# Patient Record
Sex: Male | Born: 1945
Health system: Southern US, Community
[De-identification: ages and names within clinical notes are randomized; demographics above are authoritative.]

## PROBLEM LIST (undated history)

## (undated) DIAGNOSIS — K219 Gastro-esophageal reflux disease without esophagitis: Secondary | ICD-10-CM

## (undated) DIAGNOSIS — M1990 Unspecified osteoarthritis, unspecified site: Secondary | ICD-10-CM

## (undated) DIAGNOSIS — E785 Hyperlipidemia, unspecified: Secondary | ICD-10-CM

## (undated) DIAGNOSIS — K4090 Unilateral inguinal hernia, without obstruction or gangrene, not specified as recurrent: Secondary | ICD-10-CM

## (undated) HISTORY — PX: TONSILLECTOMY AND ADENOIDECTOMY: SUR1326

## (undated) HISTORY — PX: JOINT REPLACEMENT: SHX530

## (undated) HISTORY — DX: Gastro-esophageal reflux disease without esophagitis: K21.9

## (undated) HISTORY — DX: Hyperlipidemia, unspecified: E78.5

---
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
## 2001-07-11 HISTORY — PX: KNEE SURGERY: SHX244

## 2004-12-09 ENCOUNTER — Ambulatory Visit: Payer: Self-pay | Admitting: Orthopaedic Surgery

## 2005-09-21 ENCOUNTER — Ambulatory Visit: Payer: Self-pay | Admitting: Orthopaedic Surgery

## 2006-07-28 ENCOUNTER — Ambulatory Visit: Payer: Self-pay | Admitting: Orthopaedic Surgery

## 2006-08-08 ENCOUNTER — Ambulatory Visit: Payer: Self-pay | Admitting: Gastroenterology

## 2006-08-16 ENCOUNTER — Ambulatory Visit: Payer: Self-pay | Admitting: Orthopaedic Surgery

## 2006-09-08 DIAGNOSIS — K5730 Diverticulosis of large intestine without perforation or abscess without bleeding: Secondary | ICD-10-CM | POA: Insufficient documentation

## 2007-06-25 DIAGNOSIS — M545 Low back pain, unspecified: Secondary | ICD-10-CM | POA: Insufficient documentation

## 2008-07-09 DIAGNOSIS — Z800 Family history of malignant neoplasm of digestive organs: Secondary | ICD-10-CM | POA: Insufficient documentation

## 2008-07-13 DIAGNOSIS — E782 Mixed hyperlipidemia: Secondary | ICD-10-CM | POA: Insufficient documentation

## 2010-08-02 ENCOUNTER — Ambulatory Visit: Payer: Self-pay | Admitting: Family Medicine

## 2011-11-04 DIAGNOSIS — L719 Rosacea, unspecified: Secondary | ICD-10-CM | POA: Diagnosis not present

## 2011-11-04 DIAGNOSIS — J069 Acute upper respiratory infection, unspecified: Secondary | ICD-10-CM | POA: Diagnosis not present

## 2011-11-04 DIAGNOSIS — IMO0002 Reserved for concepts with insufficient information to code with codable children: Secondary | ICD-10-CM | POA: Diagnosis not present

## 2011-11-04 DIAGNOSIS — Z0000 Encounter for general adult medical examination without abnormal findings: Secondary | ICD-10-CM | POA: Diagnosis not present

## 2012-01-16 DIAGNOSIS — Z131 Encounter for screening for diabetes mellitus: Secondary | ICD-10-CM | POA: Diagnosis not present

## 2012-01-16 DIAGNOSIS — Z136 Encounter for screening for cardiovascular disorders: Secondary | ICD-10-CM | POA: Diagnosis not present

## 2012-01-16 DIAGNOSIS — Z125 Encounter for screening for malignant neoplasm of prostate: Secondary | ICD-10-CM | POA: Diagnosis not present

## 2012-03-26 DIAGNOSIS — H2510 Age-related nuclear cataract, unspecified eye: Secondary | ICD-10-CM | POA: Diagnosis not present

## 2012-07-18 DIAGNOSIS — Z23 Encounter for immunization: Secondary | ICD-10-CM | POA: Diagnosis not present

## 2013-04-11 DIAGNOSIS — Z23 Encounter for immunization: Secondary | ICD-10-CM | POA: Diagnosis not present

## 2013-07-22 DIAGNOSIS — J069 Acute upper respiratory infection, unspecified: Secondary | ICD-10-CM | POA: Diagnosis not present

## 2013-07-22 DIAGNOSIS — Z1212 Encounter for screening for malignant neoplasm of rectum: Secondary | ICD-10-CM | POA: Diagnosis not present

## 2013-07-22 DIAGNOSIS — L719 Rosacea, unspecified: Secondary | ICD-10-CM | POA: Diagnosis not present

## 2013-07-22 DIAGNOSIS — IMO0002 Reserved for concepts with insufficient information to code with codable children: Secondary | ICD-10-CM | POA: Diagnosis not present

## 2013-07-22 DIAGNOSIS — Z0000 Encounter for general adult medical examination without abnormal findings: Secondary | ICD-10-CM | POA: Diagnosis not present

## 2013-08-06 DIAGNOSIS — R972 Elevated prostate specific antigen [PSA]: Secondary | ICD-10-CM | POA: Diagnosis not present

## 2013-08-06 DIAGNOSIS — E782 Mixed hyperlipidemia: Secondary | ICD-10-CM | POA: Diagnosis not present

## 2013-08-06 LAB — BASIC METABOLIC PANEL
BUN: 22 mg/dL — AB (ref 4–21)
CREATININE: 1 mg/dL (ref 0.6–1.3)
CREATININE: 0.8 mg/dL (ref <=1.3)
Glucose: 111 mg/dL
POTASSIUM: 4.7 mmol/L (ref 3.4–5.3)
Sodium: 138 mmol/L (ref 137–147)

## 2013-08-06 LAB — LIPID PANEL
CHOLESTEROL: 199 mg/dL (ref 0–200)
HDL: 58 mg/dL (ref 35–70)
LDL CALC: 125 mg/dL
TRIGLYCERIDES: 81 mg/dL (ref 40–160)

## 2013-08-06 LAB — PSA: PSA: 3.8

## 2013-12-24 DIAGNOSIS — K5730 Diverticulosis of large intestine without perforation or abscess without bleeding: Secondary | ICD-10-CM | POA: Diagnosis not present

## 2013-12-24 DIAGNOSIS — Z1211 Encounter for screening for malignant neoplasm of colon: Secondary | ICD-10-CM | POA: Diagnosis not present

## 2013-12-24 DIAGNOSIS — Z800 Family history of malignant neoplasm of digestive organs: Secondary | ICD-10-CM | POA: Diagnosis not present

## 2014-02-10 ENCOUNTER — Ambulatory Visit: Payer: Self-pay | Admitting: Gastroenterology

## 2014-02-10 DIAGNOSIS — E785 Hyperlipidemia, unspecified: Secondary | ICD-10-CM | POA: Diagnosis not present

## 2014-02-10 DIAGNOSIS — K649 Unspecified hemorrhoids: Secondary | ICD-10-CM | POA: Diagnosis not present

## 2014-02-10 DIAGNOSIS — Z800 Family history of malignant neoplasm of digestive organs: Secondary | ICD-10-CM | POA: Diagnosis not present

## 2014-02-10 DIAGNOSIS — Z1211 Encounter for screening for malignant neoplasm of colon: Secondary | ICD-10-CM | POA: Diagnosis not present

## 2014-02-10 DIAGNOSIS — K648 Other hemorrhoids: Secondary | ICD-10-CM | POA: Diagnosis not present

## 2014-02-10 DIAGNOSIS — D126 Benign neoplasm of colon, unspecified: Secondary | ICD-10-CM | POA: Diagnosis not present

## 2014-02-10 DIAGNOSIS — K219 Gastro-esophageal reflux disease without esophagitis: Secondary | ICD-10-CM | POA: Diagnosis not present

## 2014-02-10 DIAGNOSIS — K5730 Diverticulosis of large intestine without perforation or abscess without bleeding: Secondary | ICD-10-CM | POA: Diagnosis not present

## 2014-02-10 LAB — HM COLONOSCOPY

## 2014-02-11 LAB — PATHOLOGY REPORT

## 2014-03-19 DIAGNOSIS — Z23 Encounter for immunization: Secondary | ICD-10-CM | POA: Diagnosis not present

## 2014-04-08 DIAGNOSIS — H2510 Age-related nuclear cataract, unspecified eye: Secondary | ICD-10-CM | POA: Diagnosis not present

## 2014-04-08 DIAGNOSIS — H52229 Regular astigmatism, unspecified eye: Secondary | ICD-10-CM | POA: Diagnosis not present

## 2014-04-08 DIAGNOSIS — H524 Presbyopia: Secondary | ICD-10-CM | POA: Diagnosis not present

## 2014-10-28 ENCOUNTER — Encounter: Payer: Self-pay | Admitting: Family Medicine

## 2014-12-09 ENCOUNTER — Encounter: Payer: Self-pay | Admitting: Family Medicine

## 2014-12-09 DIAGNOSIS — R972 Elevated prostate specific antigen [PSA]: Secondary | ICD-10-CM | POA: Insufficient documentation

## 2014-12-09 DIAGNOSIS — Z131 Encounter for screening for diabetes mellitus: Secondary | ICD-10-CM | POA: Insufficient documentation

## 2014-12-09 DIAGNOSIS — E669 Obesity, unspecified: Secondary | ICD-10-CM | POA: Insufficient documentation

## 2014-12-09 DIAGNOSIS — L719 Rosacea, unspecified: Secondary | ICD-10-CM | POA: Insufficient documentation

## 2014-12-09 DIAGNOSIS — K219 Gastro-esophageal reflux disease without esophagitis: Secondary | ICD-10-CM | POA: Insufficient documentation

## 2014-12-09 DIAGNOSIS — M549 Dorsalgia, unspecified: Secondary | ICD-10-CM

## 2014-12-09 DIAGNOSIS — G8929 Other chronic pain: Secondary | ICD-10-CM | POA: Insufficient documentation

## 2014-12-23 ENCOUNTER — Encounter: Payer: Self-pay | Admitting: Family Medicine

## 2014-12-26 ENCOUNTER — Ambulatory Visit (INDEPENDENT_AMBULATORY_CARE_PROVIDER_SITE_OTHER): Payer: Medicare Other | Admitting: Family Medicine

## 2014-12-26 ENCOUNTER — Encounter: Payer: Self-pay | Admitting: Family Medicine

## 2014-12-26 VITALS — BP 120/80 | HR 72 | Temp 98.8°F | Resp 16 | Ht 67.0 in | Wt 236.0 lb

## 2014-12-26 DIAGNOSIS — E782 Mixed hyperlipidemia: Secondary | ICD-10-CM

## 2014-12-26 DIAGNOSIS — Z131 Encounter for screening for diabetes mellitus: Secondary | ICD-10-CM | POA: Diagnosis not present

## 2014-12-26 DIAGNOSIS — Z0000 Encounter for general adult medical examination without abnormal findings: Secondary | ICD-10-CM

## 2014-12-26 DIAGNOSIS — Z23 Encounter for immunization: Secondary | ICD-10-CM

## 2014-12-26 DIAGNOSIS — Z125 Encounter for screening for malignant neoplasm of prostate: Secondary | ICD-10-CM

## 2014-12-26 NOTE — Progress Notes (Signed)
Patient: Alexander Moss, Male    DOB: 1945/11/13, 69 y.o.   MRN: [id number] Visit Date: 12/28/2014  Today's Provider: Lelon Huh, MD   Chief Complaint  Patient presents with  . Medicare Wellness  . Gastrophageal Reflux  . Hyperlipidemia   Subjective:    Annual wellness visit Alexander Moss is a 69 y.o. male who presents today for his Subsequent Annual Wellness Visit. He feels well. He reports exercising biking. He reports he is sleeping fairly well.  -----------------------------------------------------------   Review of Systems  Constitutional: Negative for fatigue.  HENT: Negative for ear pain.   Eyes: Negative for pain.  Respiratory: Negative for chest tightness and shortness of breath.   Cardiovascular: Negative for chest pain, palpitations and leg swelling.  Musculoskeletal: Positive for back pain.       Joint pain  Neurological: Negative for dizziness, weakness, light-headedness and headaches.  Psychiatric/Behavioral: Negative for sleep disturbance.    History   Social History  . Marital Status: Married    Spouse Name: N/A  . Number of Children: 1  . Years of Education: [name]   Occupational History  . Retired    Social History Main Topics  . Smoking status: Former Research scientist (life sciences)  . Smokeless tobacco: Not on file  . Alcohol Use: 0.0 oz/week    0 Standard drinks or equivalent per week  . Drug Use: No  . Sexual Activity: Not on file   Other Topics Concern  . Not on file   Social History Narrative    Patient Active Problem List   Diagnosis Date Noted  . Chronic back pain 12/09/2014  . GERD (gastroesophageal reflux disease) 12/09/2014  . Obesity 12/09/2014  . PSA Elevation 12/09/2014  . Rosacea 12/09/2014  . Hyperlipidemia, mixed 07/13/2008  . Family history of GI tract cancer 07/09/2008  . Lumbago 06/25/2007  . Diverticulosis of colon without hemorrhage 09/08/2006    Past Surgical History  Procedure Laterality Date  . Knee surgery  Bilateral 2003  . Hemorrhoid surgery  1964  . Tonsillectomy and adenoidectomy      His family history includes Diabetes in his mother.    Previous Medications   ASPIRIN 81 MG TABLET    Take 1 tablet by mouth daily.   IBUPROFEN (ADVIL,MOTRIN) 200 MG TABLET    Take 1 tablet by mouth every 6 (six) hours as needed.   LORATADINE (CLARITIN) 10 MG TABLET    Take 1 tablet by mouth daily.   MULTIPLE VITAMINS-MINERALS (ZINC PO)    Take by mouth daily.   OMEGA-3 FATTY ACIDS (FISH OIL) 1000 MG CAPS    Take 2 capsules by mouth daily.   VITAMIN C (ASCORBIC ACID) 500 MG TABLET    Take 1 tablet by mouth daily.   VITAMIN D, CHOLECALCIFEROL, 1000 UNITS CAPS    Take 2 capsules by mouth daily.   VITAMIN E 400 UNIT CAPSULE    Take 400 Units by mouth daily.    Patient Care Team: Birdie Sons, MD as PCP - General (Family Medicine)     Objective:   Vitals: BP 120/80 mmHg  Pulse 72  Temp(Src) 98.8 F (37.1 C) (Oral)  Resp 16  Ht 5\' 7"  (1.702 m)  Wt 236 lb (107.049 kg)  BMI 36.95 kg/m2  SpO2 96%  Physical Exam   General Appearance:    Alert, cooperative, no distress, appears stated age  Head:    Normocephalic, without obvious abnormality, atraumatic  Eyes:  PERRL, conjunctiva/corneas clear, EOM's intact, fundi    benign, both eyes       Ears:    Normal TM's and external ear canals, both ears  Nose:   Nares normal, septum midline, mucosa normal, no drainage   or sinus tenderness  Throat:   Lips, mucosa, and tongue normal; teeth and gums normal  Neck:   Supple, symmetrical, trachea midline, no adenopathy;       thyroid:  No enlargement/tenderness/nodules; no carotid   bruit or JVD  Back:     Symmetric, no curvature, ROM normal, no CVA tenderness  Lungs:     Clear to auscultation bilaterally, respirations unlabored  Chest wall:    No tenderness or deformity  Heart:    Regular rate and rhythm, S1 and S2 normal, no murmur, rub   or gallop  Abdomen:     Soft, non-tender, bowel sounds active  all four quadrants,    no masses, no organomegaly  Genitalia:    deferred  Rectal:    deferred  Extremities:   Extremities normal, atraumatic, no cyanosis or edema  Pulses:   2+ and symmetric all extremities  Skin:   Skin color, texture, turgor normal, no rashes, multiple lipomas and cherry hemangiomas over front and back of trunk.   Lymph nodes:   Cervical, supraclavicular, and axillary nodes normal  Neurologic:   CNII-XII intact. Normal strength, sensation and reflexes      throughout    Activities of Daily Living No flowsheet data found.  Fall Risk Assessment Fall Risk  12/26/2014  Falls in the past year? Yes  Number falls in past yr: 2 or more  Injury with Fall? No  Risk for fall due to : History of fall(s)  Risk for fall due to (comments): Usually involves ladders     Depression Screen PHQ 2/9 Scores 12/26/2014  PHQ - 2 Score 0    Cognitive Testing - 6-CIT  Correct? Score   What year is it? yes 0 0 or 4  What month is it? yes 0 0 or 3  Memorize:    [name],  40,  [address],  [place],      What time is it? (within 1 hour) yes 0 0 or 3  Count backwards from 20 yes 0 0, 2, or 4  Name the months of the year yes 0 0, 2, or 4  Repeat name & address above yes 0 0, 2, 4, 6, 8, or 10       TOTAL SCORE  0/28   Interpretation:  Normal  Normal (0-7) Abnormal (8-28)       Assessment & Plan:     Annual Wellness Visit  Reviewed patient's Family Medical History Reviewed and updated list of patient's medical providers Assessment of cognitive impairment was done Assessed patient's functional ability Established a written schedule for health screening [place] Completed and Reviewed  Exercise Activities and Dietary recommendations Goals    None      Immunization History  Administered Date(s) Administered  . Influenza,inj,Quad PF,36+ Mos 04/10/2013  . Pneumococcal Polysaccharide-23 07/29/2009  . Td 07/11/2006  . Zoster 03/05/2009     Health Maintenance  Topic Date Due  . PNA vac Low Risk Adult (2 of 2 - PCV13) 07/29/2010  . INFLUENZA VACCINE  02/09/2015  . TETANUS/TDAP  07/11/2016  . COLONOSCOPY  02/11/2024  . ZOSTAVAX  Completed      Discussed health benefits of physical activity, and encouraged him to  engage in regular exercise appropriate for his age and condition.    ------------------------------------------------------------------------------------------------------------ 1. Annual physical exam   2. Prostate cancer screening  - PSA  3. Screening for diabetes mellitus  - Glucose  4. Hyperlipidemia, mixed  - Lipid panel  5. Need for prophylactic vaccination with Streptococcus pneumoniae (Pneumococcus) and Influenza vaccines  - Pneumococcal conjugate vaccine 13-valent IM  Lelon Huh, MD  12/26/2014

## 2014-12-31 DIAGNOSIS — E782 Mixed hyperlipidemia: Secondary | ICD-10-CM | POA: Diagnosis not present

## 2014-12-31 DIAGNOSIS — Z125 Encounter for screening for malignant neoplasm of prostate: Secondary | ICD-10-CM | POA: Diagnosis not present

## 2014-12-31 DIAGNOSIS — Z131 Encounter for screening for diabetes mellitus: Secondary | ICD-10-CM | POA: Diagnosis not present

## 2015-01-01 LAB — LIPID PANEL
Chol/HDL Ratio: 3.9 ratio units (ref 0.0–5.0)
Cholesterol, Total: 185 mg/dL (ref 100–199)
HDL: 47 mg/dL (ref >39)
LDL Calculated: 121 mg/dL — ABNORMAL HIGH (ref 0–99)
Triglycerides: 87 mg/dL (ref 0–149)
VLDL CHOLESTEROL CAL: 17 mg/dL (ref 5–40)

## 2015-01-01 LAB — PSA: PROSTATE SPECIFIC AG, SERUM: 4.8 ng/mL — AB (ref 0.0–4.0)

## 2015-01-01 LAB — GLUCOSE, RANDOM: GLUCOSE: 114 mg/dL — AB (ref 65–99)

## 2015-01-02 ENCOUNTER — Other Ambulatory Visit: Payer: Self-pay | Admitting: *Deleted

## 2015-01-02 DIAGNOSIS — R972 Elevated prostate specific antigen [PSA]: Secondary | ICD-10-CM

## 2015-01-05 ENCOUNTER — Telehealth: Payer: Self-pay | Admitting: Family Medicine

## 2015-01-05 DIAGNOSIS — H9193 Unspecified hearing loss, bilateral: Secondary | ICD-10-CM

## 2015-01-05 NOTE — Telephone Encounter (Signed)
Patient is requesting a referral to go see audiologist

## 2015-01-06 DIAGNOSIS — H9190 Unspecified hearing loss, unspecified ear: Secondary | ICD-10-CM | POA: Insufficient documentation

## 2015-01-06 NOTE — Telephone Encounter (Signed)
Please refer to audiology for evaluation of hearing loss. Thanks.

## 2015-01-13 ENCOUNTER — Encounter: Payer: Self-pay | Admitting: Family Medicine

## 2015-01-13 ENCOUNTER — Ambulatory Visit (INDEPENDENT_AMBULATORY_CARE_PROVIDER_SITE_OTHER): Payer: Medicare Other | Admitting: Family Medicine

## 2015-01-13 VITALS — BP 114/70 | HR 70 | Temp 97.9°F | Resp 16 | Ht 67.0 in | Wt 235.0 lb

## 2015-01-13 DIAGNOSIS — K219 Gastro-esophageal reflux disease without esophagitis: Secondary | ICD-10-CM

## 2015-01-13 DIAGNOSIS — R05 Cough: Secondary | ICD-10-CM | POA: Diagnosis not present

## 2015-01-13 DIAGNOSIS — R059 Cough, unspecified: Secondary | ICD-10-CM

## 2015-01-13 NOTE — Patient Instructions (Signed)
Discussed use of Prevacid 15 mg. Two pills in the evening for two to four weeks.. Also elevation of the legs of your bed to 6 inches. Minimize use of pillows for your head.

## 2015-01-13 NOTE — Progress Notes (Signed)
Subjective:     Patient ID: Alexander Moss, male   DOB: 16-Dec-1945, 69 y.o.   MRN: [id number]  HPI  Chief Complaint  Patient presents with  . Cough    Congestion x2 weeks  States cough bothers him more at night. Occasionally productive of beige sputum. No cold symptoms: "I don't feel bad." Reports taking TUMS daily; use two pillows for his heads at night. Prior hx of GERD per [facility].   Review of Systems  Constitutional: Negative for fever and chills.       Objective:   Physical Exam  Constitutional: He appears well-developed and well-nourished. No distress.  Ears: T.M's intact without inflammation Throat: no tonsillar enlargement or exudate Neck: no cervical adenopathy Lungs: clear     Assessment:    1. Cough-suspect mediated by nocturnal reflux 2. Gastroesophageal reflux disease, esophagitis presence not specified    Plan:    Discussed H.O.B. Elevation and the use of otc PPI's for 2-4 weeks. Phone f/u in a week if not improving.

## 2015-01-22 DIAGNOSIS — H903 Sensorineural hearing loss, bilateral: Secondary | ICD-10-CM | POA: Diagnosis not present

## 2015-01-22 DIAGNOSIS — H9319 Tinnitus, unspecified ear: Secondary | ICD-10-CM | POA: Diagnosis not present

## 2015-01-31 ENCOUNTER — Encounter: Payer: Self-pay | Admitting: Family Medicine

## 2015-02-01 NOTE — Telephone Encounter (Signed)
Please advise patient it is time to recheck PSA since it was elevated ij June.

## 2015-02-05 DIAGNOSIS — R972 Elevated prostate specific antigen [PSA]: Secondary | ICD-10-CM | POA: Diagnosis not present

## 2015-02-06 LAB — PSA: Prostate Specific Ag, Serum: 3.6 ng/mL (ref 0.0–4.0)

## 2015-02-06 NOTE — Telephone Encounter (Signed)
Done. Pt notified of results.

## 2015-04-14 DIAGNOSIS — Z23 Encounter for immunization: Secondary | ICD-10-CM | POA: Diagnosis not present

## 2015-07-12 HISTORY — PX: JOINT REPLACEMENT: SHX530

## 2015-12-31 DIAGNOSIS — M170 Bilateral primary osteoarthritis of knee: Secondary | ICD-10-CM | POA: Diagnosis not present

## 2016-04-12 DIAGNOSIS — Z23 Encounter for immunization: Secondary | ICD-10-CM | POA: Diagnosis not present

## 2016-04-17 ENCOUNTER — Ambulatory Visit: Payer: Self-pay | Admitting: Orthopedic Surgery

## 2016-04-26 ENCOUNTER — Telehealth: Payer: Self-pay | Admitting: Family Medicine

## 2016-04-26 NOTE — Telephone Encounter (Signed)
Called Pt to schedule AWV with [facility] for 11/2 before ov with Dr. Caryn Section - knb

## 2016-05-11 ENCOUNTER — Encounter (HOSPITAL_COMMUNITY): Payer: Self-pay

## 2016-05-11 DIAGNOSIS — H04123 Dry eye syndrome of bilateral lacrimal glands: Secondary | ICD-10-CM | POA: Diagnosis not present

## 2016-05-11 DIAGNOSIS — D3131 Benign neoplasm of right choroid: Secondary | ICD-10-CM | POA: Diagnosis not present

## 2016-05-11 DIAGNOSIS — H25013 Cortical age-related cataract, bilateral: Secondary | ICD-10-CM | POA: Diagnosis not present

## 2016-05-11 DIAGNOSIS — H35363 Drusen (degenerative) of macula, bilateral: Secondary | ICD-10-CM | POA: Diagnosis not present

## 2016-05-11 DIAGNOSIS — H2513 Age-related nuclear cataract, bilateral: Secondary | ICD-10-CM | POA: Diagnosis not present

## 2016-05-11 DIAGNOSIS — M1711 Unilateral primary osteoarthritis, right knee: Secondary | ICD-10-CM | POA: Diagnosis not present

## 2016-05-11 NOTE — Patient Instructions (Signed)
Alexander Moss  05/11/2016   Your procedure is scheduled on: 05/23/2016    Report to [facility] Main  Entrance take [facility]  elevators to 3rd floor to  [place] at   0600 AM.  Call this number if you have problems the morning of surgery [phone]   Remember: ONLY 1 PERSON MAY GO WITH YOU TO SHORT STAY TO GET  READY MORNING OF [place].  Do not eat food or drink liquids :After Midnight.     Take these medicines the morning of surgery with A SIP OF WATER: allegra                                 You may not have any metal on your body including hair pins and              piercings  Do not wear jewelry,  lotions, powders or perfumes, deodorant               Men may shave face and neck.   Do not bring valuables to the hospital. [place].  Contacts, dentures or bridgework may not be worn into surgery.  Leave suitcase in the car. After surgery it may be brought to your room.       Special Instructions: N/A              Please read over the following fact sheets you were given: _____________________________________________________________________             [facility] - Preparing for Surgery Before surgery, you can play an important role.  Because skin is not sterile, your skin needs to be as free of germs as possible.  You can reduce the number of germs on your skin by washing with CHG (chlorahexidine gluconate) soap before surgery.  CHG is an antiseptic cleaner which kills germs and bonds with the skin to continue killing germs even after washing. Please DO NOT use if you have an allergy to CHG or antibacterial soaps.  If your skin becomes reddened/irritated stop using the CHG and inform your nurse when you arrive at Short Stay. Do not shave (including legs and underarms) for at least 48 hours prior to the first CHG shower.  You may shave your face/neck. Please follow these instructions  carefully:  1.  Shower with CHG Soap the night before surgery and the  morning of Surgery.  2.  If you choose to wash your hair, wash your hair first as usual with your  normal  shampoo.  3.  After you shampoo, rinse your hair and body thoroughly to remove the  shampoo.                           4.  Use CHG as you would any other liquid soap.  You can apply chg directly  to the skin and wash                       Gently with a scrungie or clean washcloth.  5.  Apply the CHG Soap to your body ONLY FROM THE NECK DOWN.   Do not use on face/ open  Wound or open sores. Avoid contact with eyes, ears mouth and genitals (private parts).                       Wash face,  Genitals (private parts) with your normal soap.             6.  Wash thoroughly, paying special attention to the area where your surgery  will be performed.  7.  Thoroughly rinse your body with warm water from the neck down.  8.  DO NOT shower/wash with your normal soap after using and rinsing off  the CHG Soap.                9.  Pat yourself dry with a clean towel.            10.  Wear clean pajamas.            11.  Place clean sheets on your bed the night of your first shower and do not  sleep with pets. Day of Surgery : Do not apply any lotions/deodorants the morning of surgery.  Please wear clean clothes to the hospital/surgery center.  FAILURE TO FOLLOW THESE INSTRUCTIONS MAY RESULT IN THE CANCELLATION OF YOUR SURGERY PATIENT SIGNATURE_________________________________  NURSE SIGNATURE__________________________________  ________________________________________________________________________  WHAT IS A BLOOD TRANSFUSION? Blood Transfusion Information  A transfusion is the replacement of blood or some of its parts. Blood is made up of multiple cells which provide different functions.  Red blood cells carry oxygen and are used for blood loss replacement.  White blood cells fight against  infection.  Platelets control bleeding.  Plasma helps clot blood.  Other blood products are available for specialized needs, such as hemophilia or other clotting disorders. BEFORE THE TRANSFUSION  Who gives blood for transfusions?   Healthy volunteers who are fully evaluated to make sure their blood is safe. This is blood bank blood. Transfusion therapy is the safest it has ever been in the practice of medicine. Before blood is taken from a donor, a complete history is taken to make sure that person has no history of diseases nor engages in risky social behavior (examples are intravenous drug use or sexual activity with multiple partners). The donor's travel history is screened to minimize risk of transmitting infections, such as malaria. The donated blood is tested for signs of infectious diseases, such as HIV and hepatitis. The blood is then tested to be sure it is compatible with you in order to minimize the chance of a transfusion reaction. If you or a relative donates blood, this is often done in anticipation of surgery and is not appropriate for emergency situations. It takes many days to process the donated blood. RISKS AND COMPLICATIONS Although transfusion therapy is very safe and saves many lives, the main dangers of transfusion include:   Getting an infectious disease.  Developing a transfusion reaction. This is an allergic reaction to something in the blood you were given. Every precaution is taken to prevent this. The decision to have a blood transfusion has been considered carefully by your caregiver before blood is given. Blood is not given unless the benefits outweigh the risks. AFTER THE TRANSFUSION  Right after receiving a blood transfusion, you will usually feel much better and more energetic. This is especially true if your red blood cells have gotten low (anemic). The transfusion raises the level of the red blood cells which carry oxygen, and this usually causes an energy  increase.  The nurse administering the transfusion will monitor you carefully for complications. HOME CARE INSTRUCTIONS  No special instructions are needed after a transfusion. You may find your energy is better. Speak with your caregiver about any limitations on activity for underlying diseases you may have. SEEK MEDICAL CARE IF:   Your condition is not improving after your transfusion.  You develop redness or irritation at the intravenous (IV) site. SEEK IMMEDIATE MEDICAL CARE IF:  Any of the following symptoms occur over the next 12 hours:  Shaking chills.  You have a temperature by mouth above 102 F (38.9 C), not controlled by medicine.  Chest, back, or muscle pain.  People around you feel you are not acting correctly or are confused.  Shortness of breath or difficulty breathing.  Dizziness and fainting.  You get a rash or develop hives.  You have a decrease in urine output.  Your urine turns a dark color or changes to pink, red, or brown. Any of the following symptoms occur over the next 10 days:  You have a temperature by mouth above 102 F (38.9 C), not controlled by medicine.  Shortness of breath.  Weakness after normal activity.  The white part of the eye turns yellow (jaundice).  You have a decrease in the amount of urine or are urinating less often.  Your urine turns a dark color or changes to pink, red, or brown. Document Released: 06/24/2000 Document Revised: 09/19/2011 Document Reviewed: 02/11/2008 ExitCare Patient Information 2014 [place].  _______________________________________________________________________  Incentive Spirometer  An incentive spirometer is a tool that can help keep your lungs clear and active. This tool measures how well you are filling your lungs with each breath. Taking long deep breaths may help reverse or decrease the chance of developing breathing (pulmonary) problems (especially infection) following:  A long  period of time when you are unable to move or be active. BEFORE THE PROCEDURE   If the spirometer includes an indicator to show your best effort, your nurse or respiratory therapist will set it to a desired goal.  If possible, sit up straight or lean slightly forward. Try not to slouch.  Hold the incentive spirometer in an upright position. INSTRUCTIONS FOR USE  1. Sit on the edge of your bed if possible, or sit up as far as you can in bed or on a chair. 2. Hold the incentive spirometer in an upright position. 3. Breathe out normally. 4. Place the mouthpiece in your mouth and seal your lips tightly around it. 5. Breathe in slowly and as deeply as possible, raising the piston or the ball toward the top of the column. 6. Hold your breath for 3-5 seconds or for as long as possible. Allow the piston or ball to fall to the bottom of the column. 7. Remove the mouthpiece from your mouth and breathe out normally. 8. Rest for a few seconds and repeat Steps 1 through 7 at least 10 times every 1-2 hours when you are awake. Take your time and take a few normal breaths between deep breaths. 9. The spirometer may include an indicator to show your best effort. Use the indicator as a goal to work toward during each repetition. 10. After each set of 10 deep breaths, practice coughing to be sure your lungs are clear. If you have an incision (the cut made at the time of surgery), support your incision when coughing by placing a pillow or rolled up towels firmly against it. Once you are able to get  out of bed, walk around indoors and cough well. You may stop using the incentive spirometer when instructed by your caregiver.  RISKS AND COMPLICATIONS  Take your time so you do not get dizzy or light-headed.  If you are in pain, you may need to take or ask for pain medication before doing incentive spirometry. It is harder to take a deep breath if you are having pain. AFTER USE  Rest and breathe slowly and  easily.  It can be helpful to keep track of a log of your progress. Your caregiver can provide you with a simple table to help with this. If you are using the spirometer at home, follow these instructions: [place] IF:   You are having difficultly using the spirometer.  You have trouble using the spirometer as often as instructed.  Your pain medication is not giving enough relief while using the spirometer.  You develop fever of 100.5 F (38.1 C) or higher. SEEK IMMEDIATE MEDICAL CARE IF:   You cough up bloody sputum that had not been present before.  You develop fever of 102 F (38.9 C) or greater.  You develop worsening pain at or near the incision site. MAKE SURE YOU:   Understand these instructions.  Will watch your condition.  Will get help right away if you are not doing well or get worse. Document Released: 11/07/2006 Document Revised: 09/19/2011 Document Reviewed: 01/08/2007 [facility] Patient Information 2014 [place], [place].   ________________________________________________________________________

## 2016-05-12 ENCOUNTER — Ambulatory Visit (INDEPENDENT_AMBULATORY_CARE_PROVIDER_SITE_OTHER): Payer: Medicare Other | Admitting: Family Medicine

## 2016-05-12 ENCOUNTER — Encounter: Payer: Self-pay | Admitting: Family Medicine

## 2016-05-12 VITALS — BP 124/80 | HR 66 | Temp 99.0°F | Ht 66.0 in | Wt 234.4 lb

## 2016-05-12 VITALS — BP 124/80 | HR 66 | Temp 99.0°F | Resp 16 | Ht 67.0 in | Wt 237.0 lb

## 2016-05-12 DIAGNOSIS — Z0000 Encounter for general adult medical examination without abnormal findings: Secondary | ICD-10-CM

## 2016-05-12 DIAGNOSIS — Z01810 Encounter for preprocedural cardiovascular examination: Secondary | ICD-10-CM

## 2016-05-12 NOTE — Progress Notes (Signed)
Subjective:   Alexander Moss is a 70 y.o. male who presents for Medicare Annual/Subsequent preventive examination.  Review of Systems:   Cardiac Risk Factors include: advanced age (>52men, >70 women);dyslipidemia;male gender;obesity (BMI >30kg/m2)     Objective:    Vitals: BP 124/80   Pulse 66   Temp 99 F (37.2 C) (Oral)   Ht 5\' 6"  (1.676 m)   Wt 234 lb 6 oz (106.3 kg)   BMI 37.83 kg/m   Body mass index is 37.83 kg/m.  Tobacco History  Smoking Status  . Former Smoker  Smokeless Tobacco  . Never Used     Counseling given: No   History reviewed. No pertinent past medical history. Past Surgical History:  Procedure Laterality Date  . [place]  . KNEE SURGERY Bilateral 2003  . TONSILLECTOMY AND ADENOIDECTOMY     Family History  Problem Relation Age of Onset  . Diabetes Mother    History  Sexual Activity  . Sexual activity: Not on file    Outpatient Encounter Prescriptions as of 05/12/2016  Medication Sig  . aspirin 81 MG tablet Take 1 tablet by mouth daily.  . Calcium Carb-Cholecalciferol (CALCIUM 1000 + D PO) Take 1 tablet by mouth daily.  . Cyanocobalamin (VITAMIN B-12 PO) Take 1,000 Units by mouth daily.   . fexofenadine (ALLEGRA) 180 MG tablet Take 180 mg by mouth daily.  . Multiple Vitamins-Minerals (ZINC PO) Take by mouth daily.  . naproxen sodium (ANAPROX) 220 MG tablet Take 440 mg by mouth daily. Will stop prior to procedure  . Omega-3 Fatty Acids (FISH OIL) 1000 MG CAPS Take 2 capsules by mouth daily.  . vitamin C (ASCORBIC ACID) 500 MG tablet Take 1,000 mg by mouth daily.  . Vitamin D, Cholecalciferol, 1000 UNITS CAPS Take 2 capsules by mouth daily.  . vitamin E 400 UNIT capsule Take 400 Units by mouth daily.  . Zinc 50 MG CAPS Take 1 capsule by mouth daily.   No facility-administered encounter medications on file as of 05/12/2016.     Activities of Daily Living In your present state of health, do you have any difficulty  performing the following activities: 05/12/2016  Hearing? Y  Vision? N  Difficulty concentrating or making decisions? Y  Walking or climbing stairs? Y  Dressing or bathing? N  Doing errands, shopping? N  Preparing Food and eating ? N  Using the Toilet? N  In the past six months, have you accidently leaked urine? N  Do you have problems with loss of bowel control? N  Managing your Medications? N  Managing your Finances? Y  Housekeeping or managing your Housekeeping? 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 Increase water intake          Starting 05/12/16, I will increase my water in take back to 6 glasses a day.      Fall Risk Fall Risk  05/12/2016 05/12/2016 12/26/2014  Falls in the past year? No No Yes  Number falls in past yr: - - 2 or more  Injury with Fall? - - No  Risk for fall due to : - - History of fall(s)  Risk for fall due to (comments): - - Usually involves ladders   Depression Screen PHQ 2/9 Scores 05/12/2016 05/12/2016 12/26/2014  PHQ - 2 Score 0 0 0    Cognitive Function     6CIT Screen 05/12/2016  What Year? 0 points  What month? 0 points  What time? 0 points  Count back from 20 0 points  Months in reverse 0 points  Repeat phrase 0 points  Total Score 0    Immunization History  Administered Date(s) Administered  . Influenza,inj,Quad PF,36+ Mos 04/10/2013  . Pneumococcal Conjugate-13 12/26/2014  . Pneumococcal Polysaccharide-23 07/29/2009  . Td 07/11/2006  .  Zoster 03/05/2009   Screening Tests Health Maintenance  Topic Date Due  . Hepatitis C Screening  07/10/2016 (Originally Oct 10, 1945)  . TETANUS/TDAP  07/11/2016  . COLONOSCOPY  02/11/2024  . INFLUENZA VACCINE  Completed  . ZOSTAVAX  Completed  . PNA vac Low Risk Adult  Completed      Plan:  I have personally reviewed and addressed the Medicare Annual Wellness questionnaire and have noted the following in the patient's chart:  A. Medical and social history B. Use of alcohol, tobacco or illicit drugs  C. Current medications and supplements D. Functional ability and status E.  Nutritional status F.  Physical activity G. Advance directives H. List of other physicians I.  Hospitalizations, surgeries, and ER visits in previous 12 months J.  [place] such as hearing and vision if needed, cognitive and depression L. Referrals and appointments - none  In addition, I have reviewed and discussed with patient certain preventive protocols, quality metrics, and best practice recommendations. A written personalized care plan for preventive services as well as general preventive health recommendations were provided to patient.  See attached scanned questionnaire for additional information.   Signed,  Fabio Neighbors, LPN Nurse Health Advisor   I have reviewed the health advisor's note, was available for consultation, and agree with documentation and plan  Lelon Huh, MD

## 2016-05-12 NOTE — Progress Notes (Signed)
Patient: Alexander Moss Male    DOB: 03/02/1946   70 y.o.   MRN: [id number] Visit Date: 05/12/2016  Today's Provider: Lelon Huh, MD   Chief Complaint  Patient presents with  . Medical Clearance    Surgical clearance   Subjective:    HPI Surgical Clearance: Patient is scheduled to have right total knee arthoplasty on 05/23/2016 at [facility]. Surgeon will be Dr. Gaynelle Arabian. Patient denies any recent fever chills or sweats, chest pains, dizziness, dyspnea or palpitations. Has no personal or family history of surgical or anesthesia complications. Denies lout snoring or any reports abnormal breathing when sleeping. Sleeps well and wakes refreshed with no daytime somnolence.   Past Surgical History:  Procedure Laterality Date  . [place]  . KNEE SURGERY Bilateral 2003  . TONSILLECTOMY AND ADENOIDECTOMY     No past medical history on file.  Patient Active Problem List   Diagnosis Date Noted  . Cough 01/13/2015  . Hearing loss 01/06/2015  . Chronic back pain 12/09/2014  . GERD (gastroesophageal reflux disease) 12/09/2014  . Obesity 12/09/2014  . PSA Elevation 12/09/2014  . Rosacea 12/09/2014  . Hyperlipidemia, mixed 07/13/2008  . Family history of GI tract cancer 07/09/2008  . Lumbago 06/25/2007  . Diverticulosis of colon without hemorrhage 09/08/2006   family history includes Diabetes in his mother.   Family Status  Relation Status  . Mother Deceased at age 20   Heart Failure  . Father Deceased at age 69   unknown  . Sister Deceased   colon cancer  . Daughter Alive      No Known Allergies   Current Outpatient Prescriptions:  .  aspirin 81 MG tablet, Take 1 tablet by mouth daily., Disp: , Rfl:  .  Calcium Carb-Cholecalciferol (CALCIUM 1000 + D PO), Take 1 tablet by mouth daily., Disp: , Rfl:  .  Cyanocobalamin (VITAMIN B-12 PO), Take 1,000 Units by mouth daily. , Disp: , Rfl:  .  fexofenadine (ALLEGRA) 180 MG tablet, Take  180 mg by mouth daily., Disp: , Rfl:  .  Multiple Vitamins-Minerals (ZINC PO), Take by mouth daily., Disp: , Rfl:  .  naproxen sodium (ANAPROX) 220 MG tablet, Take 440 mg by mouth daily. Will stop prior to procedure, Disp: , Rfl:  .  Omega-3 Fatty Acids (FISH OIL) 1000 MG CAPS, Take 2 capsules by mouth daily., Disp: , Rfl:  .  vitamin C (ASCORBIC ACID) 500 MG tablet, Take 1,000 mg by mouth daily., Disp: , Rfl:  .  Vitamin D, Cholecalciferol, 1000 UNITS CAPS, Take 2 capsules by mouth daily., Disp: , Rfl:  .  vitamin E 400 UNIT capsule, Take 400 Units by mouth daily., Disp: , Rfl:  .  Zinc 50 MG CAPS, Take 1 capsule by mouth daily., Disp: , Rfl:   Review of Systems  Constitutional: Negative for appetite change, chills and fever.  Respiratory: Negative for chest tightness, shortness of breath and wheezing.   Cardiovascular: Negative for chest pain and palpitations.  Gastrointestinal: Negative for abdominal pain, nausea and vomiting.  Musculoskeletal: Positive for arthralgias (right knee).    Social History  Substance Use Topics  . Smoking status: Former Research scientist (life sciences)  . Smokeless tobacco: Never Used  . Alcohol use 0.0 oz/week     Comment: 1/2 beer daily or less   Objective:   BP 124/80 (BP Location: Right Arm, Patient Position: Sitting, Cuff Size: Large)   Pulse 66   Temp  24 F (37.2 C) (Oral)   Resp 16   Ht 5\' 7"  (1.702 m)   Wt 237 lb (107.5 kg)   SpO2 95% Comment: room air  BMI 37.12 kg/m   Physical Exam   General Appearance:    Alert, cooperative, no distress, appears stated age, obese  Head:    Normocephalic, without obvious abnormality, atraumatic  Eyes:    PERRL, conjunctiva/corneas clear, EOM's intact, fundi    benign, both eyes       Ears:    Normal TM's and external ear canals, both ears  Nose:   Nares normal, septum midline, mucosa normal, no drainage   or sinus tenderness  Throat:   Lips, mucosa, and tongue normal; teeth and gums normal  Neck:   Supple, symmetrical,  trachea midline, no adenopathy;       thyroid:  No enlargement/tenderness/nodules; no carotid   bruit or JVD  Back:     Symmetric, no curvature, ROM normal, no CVA tenderness  Lungs:     Clear to auscultation bilaterally, respirations unlabored  Chest wall:    No tenderness or deformity  Heart:    Regular rate and rhythm, S1 and S2 normal, no murmur, rub   or gallop  Abdomen:     Soft, non-tender, bowel sounds active all four quadrants,    no masses, no organomegaly  Genitalia:    deferred  Rectal:    deferred  Extremities:   Extremities normal, atraumatic, no cyanosis or edema  Pulses:   2+ and symmetric all extremities  Skin:   Skin color, texture, turgor normal, no rashes or lesions. Innumerable lipmos  Lymph nodes:   Cervical, supraclavicular, and axillary nodes normal  Neurologic:   CNII-XII intact. Normal strength, sensation and reflexes      throughout   EKG: First degree AV block Occasional ectopic beats.     Assessment & Plan:     1. Preoperative cardiovascular examination Patient has no absolute or relative contraindications to planned surgery and anesthesia including general anesthesia. He is at low risk for cardiac and pulmonary complications. Anticipates pre-op labs being done at upcoming appointment. If labs normal then no additional medical precautions recommended. He reports he has already had flu vaccine this year.  - EKG 12-Lead       Lelon Huh, MD  [facility]

## 2016-05-12 NOTE — Patient Instructions (Signed)
Alexander Moss , Thank you for taking time to come for your Medicare Wellness Visit. I appreciate your ongoing commitment to your health goals. Please review the following plan we discussed and let me know if I can assist you in the future.   These are the goals we discussed: Goals    . Increase water intake          Starting 05/12/16, I will increase my water in take back to 6 glasses a day.       This is a list of the screening recommended for you and due dates:  Health Maintenance  Topic Date Due  .  Hepatitis C: One time screening is recommended by Center for Disease Control  (CDC) for  adults born from [birth year] through 1967.   07/10/2016*  . Tetanus Vaccine  07/11/2016  . Colon Cancer Screening  02/11/2024  . Flu Shot  Completed  . Shingles Vaccine  Completed  . Pneumonia vaccines  Completed  *Topic was postponed. The date shown is not the original due date.   Preventive Care for Adults  A healthy lifestyle and preventive care can promote health and wellness. Preventive health guidelines for adults include the following key practices.  . A routine yearly physical is a good way to check with your health care provider about your health and preventive screening. It is a chance to share any concerns and updates on your health and to receive a thorough exam.  . Visit your dentist for a routine exam and preventive care every 6 months. Brush your teeth twice a day and floss once a day. Good oral hygiene prevents tooth decay and gum disease.  . The frequency of eye exams is based on your age, health, family medical history, use  of contact lenses, and other factors. Follow your health care provider's ecommendations for frequency of eye exams.  . Eat a healthy diet. Foods like vegetables, fruits, whole grains, low-fat dairy products, and lean protein foods contain the nutrients you need without too many calories. Decrease your intake of foods high in solid fats, added sugars, and salt. Eat the  right amount of calories for you. Get information about a proper diet from your health care provider, if necessary.  . Regular physical exercise is one of the most important things you can do for your health. Most adults should get at least 150 minutes of moderate-intensity exercise (any activity that increases your heart rate and causes you to sweat) each week. In addition, most adults need muscle-strengthening exercises on 2 or more days a week.  Silver Sneakers may be a benefit available to you. To determine eligibility, you may visit the website: www.silversneakers.com or contact program at 229-704-8101 Mon-Fri between 8AM-8PM.   . Maintain a healthy weight. The body mass index (BMI) is a screening tool to identify possible weight problems. It provides an estimate of body fat based on height and weight. Your health care provider can find your BMI and can help you achieve or maintain a healthy weight.   For adults 20 years and older: ? A BMI below 18.5 is considered underweight. ? A BMI of 18.5 to 24.9 is normal. ? A BMI of 25 to 29.9 is considered overweight. ? A BMI of 30 and above is considered obese.   . Maintain normal blood lipids and cholesterol levels by exercising and minimizing your intake of saturated fat. Eat a balanced diet with plenty of fruit and vegetables. Blood tests for lipids and cholesterol should  begin at age 92 and be repeated every 5 years. If your lipid or cholesterol levels are high, you are over 50, or you are at high risk for heart disease, you may need your cholesterol levels checked more frequently. Ongoing high lipid and cholesterol levels should be treated with medicines if diet and exercise are not working.  . If you smoke, find out from your health care provider how to quit. If you do not use tobacco, please do not start.  . If you choose to drink alcohol, please do not consume more than 2 drinks per day. One drink is considered to be 12 ounces (355 mL) of  beer, 5 ounces (148 mL) of wine, or 1.5 ounces (44 mL) of liquor.  . If you are 20-75 years old, ask your health care provider if you should take aspirin to prevent strokes.  . Use sunscreen. Apply sunscreen liberally and repeatedly throughout the day. You should seek shade when your shadow is shorter than you. Protect yourself by wearing long sleeves, pants, a wide-brimmed hat, and sunglasses year round, whenever you are outdoors.  . Once a month, do a whole body skin exam, using a mirror to look at the skin on your back. Tell your health care provider of new moles, moles that have irregular borders, moles that are larger than a pencil eraser, or moles that have changed in shape or color.

## 2016-05-13 ENCOUNTER — Encounter: Payer: Self-pay | Admitting: Family Medicine

## 2016-05-13 ENCOUNTER — Encounter (HOSPITAL_COMMUNITY)
Admission: RE | Admit: 2016-05-13 | Discharge: 2016-05-13 | Disposition: A | Discharge status: Home / Self Care | Payer: Medicare Other | Source: Ambulatory Visit | Attending: Orthopedic Surgery | Admitting: Orthopedic Surgery

## 2016-05-13 ENCOUNTER — Encounter (HOSPITAL_COMMUNITY): Payer: Self-pay

## 2016-05-13 DIAGNOSIS — R05 Cough: Secondary | ICD-10-CM | POA: Diagnosis not present

## 2016-05-13 DIAGNOSIS — E669 Obesity, unspecified: Secondary | ICD-10-CM | POA: Diagnosis not present

## 2016-05-13 DIAGNOSIS — H9190 Unspecified hearing loss, unspecified ear: Secondary | ICD-10-CM | POA: Insufficient documentation

## 2016-05-13 DIAGNOSIS — M545 Low back pain: Secondary | ICD-10-CM | POA: Insufficient documentation

## 2016-05-13 DIAGNOSIS — K219 Gastro-esophageal reflux disease without esophagitis: Secondary | ICD-10-CM | POA: Diagnosis not present

## 2016-05-13 DIAGNOSIS — L719 Rosacea, unspecified: Secondary | ICD-10-CM | POA: Insufficient documentation

## 2016-05-13 DIAGNOSIS — E782 Mixed hyperlipidemia: Secondary | ICD-10-CM | POA: Insufficient documentation

## 2016-05-13 DIAGNOSIS — R972 Elevated prostate specific antigen [PSA]: Secondary | ICD-10-CM | POA: Diagnosis not present

## 2016-05-13 DIAGNOSIS — Z01812 Encounter for preprocedural laboratory examination: Secondary | ICD-10-CM | POA: Insufficient documentation

## 2016-05-13 DIAGNOSIS — Z800 Family history of malignant neoplasm of digestive organs: Secondary | ICD-10-CM | POA: Insufficient documentation

## 2016-05-13 DIAGNOSIS — K5730 Diverticulosis of large intestine without perforation or abscess without bleeding: Secondary | ICD-10-CM | POA: Diagnosis not present

## 2016-05-13 DIAGNOSIS — M1990 Unspecified osteoarthritis, unspecified site: Secondary | ICD-10-CM | POA: Insufficient documentation

## 2016-05-13 HISTORY — DX: Unspecified osteoarthritis, unspecified site: M19.90

## 2016-05-13 LAB — COMPREHENSIVE METABOLIC PANEL
ALK PHOS: 56 U/L (ref 38–126)
ALT: 31 U/L (ref 17–63)
AST: 41 U/L (ref 15–41)
Albumin: 4.1 g/dL (ref 3.5–5.0)
Anion gap: 6 (ref 5–15)
BILIRUBIN TOTAL: 0.9 mg/dL (ref 0.3–1.2)
BUN: 24 mg/dL — AB (ref 6–20)
CALCIUM: 9.5 mg/dL (ref 8.9–10.3)
CHLORIDE: 103 mmol/L (ref 101–111)
CO2: 27 mmol/L (ref 22–32)
CREATININE: 0.95 mg/dL (ref 0.61–1.24)
Glucose, Bld: 90 mg/dL (ref 65–99)
Potassium: 5 mmol/L (ref 3.5–5.1)
Sodium: 136 mmol/L (ref 135–145)
TOTAL PROTEIN: 7.9 g/dL (ref 6.5–8.1)

## 2016-05-13 LAB — PROTIME-INR
INR: 1
PROTHROMBIN TIME: 13.2 s (ref 11.4–15.2)

## 2016-05-13 LAB — URINALYSIS, ROUTINE W REFLEX MICROSCOPIC
Bilirubin Urine: NEGATIVE
GLUCOSE, UA: NEGATIVE mg/dL
Hgb urine dipstick: NEGATIVE
KETONES UR: NEGATIVE mg/dL
LEUKOCYTES UA: NEGATIVE
NITRITE: NEGATIVE
PH: 7.5 (ref 5.0–8.0)
Protein, ur: NEGATIVE mg/dL
SPECIFIC GRAVITY, URINE: 1.018 (ref 1.005–1.030)

## 2016-05-13 LAB — CBC
HEMATOCRIT: 45.4 % (ref 39.0–52.0)
Hemoglobin: 15.7 g/dL (ref 13.0–17.0)
MCH: 32.9 pg (ref 26.0–34.0)
MCHC: 34.6 g/dL (ref 30.0–36.0)
MCV: 95.2 fL (ref 78.0–100.0)
PLATELETS: 266 10*3/uL (ref 150–400)
RBC: 4.77 MIL/uL (ref 4.22–5.81)
RDW: 13.1 % (ref 11.5–15.5)
WBC: 6.6 10*3/uL (ref 4.0–10.5)

## 2016-05-13 LAB — APTT: aPTT: 32 seconds (ref 24–36)

## 2016-05-13 LAB — SURGICAL PCR SCREEN
MRSA, PCR: NEGATIVE
STAPHYLOCOCCUS AUREUS: POSITIVE — AB

## 2016-05-13 LAB — ABO/RH: ABO/RH(D): O POS

## 2016-05-17 NOTE — Progress Notes (Signed)
Clearance- Dr Caryn Section 05/12/16- on chart

## 2016-05-22 ENCOUNTER — Ambulatory Visit: Payer: Self-pay | Admitting: Orthopedic Surgery

## 2016-05-22 NOTE — H&P (Signed)
Alexander Moss DOB: 1946/03/17 Married / Language: [name] / Race: White Male Date of Admission:  05/23/2016 CC:  Right knee pain History of Present Illness The patient is a 70 year old male who comes in for a preoperative History and Physical. The patient is scheduled for a right total knee arthroplasty to be performed by Dr. Dione Plover. Aluisio, MD at [facility] on 05-23-2016. The patient was seen as a second opinion. The patient reports right knee (greater than left) symptoms including: pain which began year(s) ago without any known injury (states due to his job he was kneeling a lot).The patient feels that the symptoms are worsening (especially over the past 6 months). Previous work-up for this problem has included none (recently, history of left knee arthroscopy in 2007 and right knee in 2008). Symptoms are reported to be located in the left knee and right knee and include knee pain (sharp off and on), stiffness (after sitting), instability (occasionally) and difficulty ambulating (after walking for longer periods), while symptoms reported do not include swelling. Current treatment includes nonsteroidal anti-inflammatory drugs (Aleve). His right knee has gotten progressively worse over time. He is at a stage now where it is bothering him with most activities. He would like to be more active, but the right knee is preventing him from doing so. Left knee bothers him, but to a lesser degree. He has had injection in the past without much benefit. He is looking for more permanent solution to his knee problem at this time. He is ready to proceed with surgery. They have been treated conservatively in the past for the above stated problem and despite conservative measures, they continue to have progressive pain and severe functional limitations and dysfunction. They have failed non-operative management including home exercise, medications, and injections. It is felt that they would benefit from  undergoing total joint replacement. Risks and benefits of the procedure have been discussed with the patient and they elect to proceed with surgery. There are no active contraindications to surgery such as ongoing infection or rapidly progressive neurological disease.  Problem List/Past Medical  Primary osteoarthritis of both knees (M17.0)  Gastroesophageal Reflux Disease  Impaired Hearing  Measles  Mumps  Allergies No Known Drug Allergies  Family History Cancer  Father, Sister. Diabetes Mellitus  Mother. First Degree Relatives  reported Heart Disease  Father. Heart disease in male family member before age 44  Hypertension  Mother.  Social History Children  1 Current drinker  12/31/2015: Currently drinks beer and hard liquor less than 5 times per week Current work status  retired Furniture conservator/restorer weekly; does gym / [employer] Living situation  live with spouse Marital status  married No history of drug/alcohol rehab  Not under pain contract  Number of flights of stairs before winded  2-3 Tobacco / smoke exposure  12/31/2015: no Tobacco use  Former smoker. 12/31/2015: smoke(d) 1 1/2 pack(s) per day Advance Directives  Living Will, [place] with wife following the Right Total Knee Arthroplasty to be performed on 05/23/2016.  Medication History  Aleve (220MG  Capsule, 1 (one) Oral) Active. Vitamins/Minerals (Oral) Active. Aspirin (81MG  Tablet, Oral) Active. Fish Oil (Oral) Specific strength unknown - Active. Fexofenadine HCl (60MG  Tablet, Oral) Active.  Past Surgical History Arthroscopy of Knee  bilateral Hemorrhoidectomy  Tonsillectomy    Review of Systems General Not Present- Chills, Fatigue, Fever, Memory Loss, Night Sweats, Weight Gain and Weight Loss. Skin Not Present- Eczema, Hives, Itching, Lesions and Rash.  HEENT Not Present- Dentures, Double Vision, Headache, Hearing Loss, Tinnitus and Visual  Loss. Respiratory Not Present- Allergies, Chronic Cough, Coughing up blood, Shortness of breath at rest and Shortness of breath with exertion. Cardiovascular Not Present- Chest Pain, Difficulty Breathing Lying Down, Murmur, Palpitations, Racing/skipping heartbeats and Swelling. Gastrointestinal Not Present- Abdominal Pain, Bloody Stool, Constipation, Diarrhea, Difficulty Swallowing, Heartburn, Jaundice, Loss of appetitie, Nausea and Vomiting. Male Genitourinary Present- Urinary frequency. Not Present- Blood in Urine, Discharge, Flank Pain, Incontinence, Painful Urination, Urgency, Urinary Retention, Urinating at Night and Weak urinary stream. Musculoskeletal Present- Joint Pain. Not Present- Back Pain, Joint Swelling, Morning Stiffness, Muscle Pain, Muscle Weakness and Spasms. Neurological Not Present- Blackout spells, Difficulty with balance, Dizziness, Paralysis, Tremor and Weakness. Psychiatric Not Present- Insomnia.  Vitals Weight: 225 lb Height: 68in Body Surface Area: 2.15 m Body Mass Index: 34.21 kg/m  Pulse: 56 (Regular)  BP: 124/78 (Sitting, Left Arm, Standard)  Physical Exam General Mental Status -Alert, cooperative and good historian. General Appearance-pleasant, Not in acute distress. Orientation-Oriented X3. Build & Nutrition-Well nourished and Well developed.  Head and Neck Head-normocephalic, atraumatic . Neck Global Assessment - supple, no bruit auscultated on the right, no bruit auscultated on the left.  Eye Vision-Wears corrective lenses. Pupil - Bilateral-Regular and Round. Motion - Bilateral-EOMI.  ENMT Note: bilateral hearing aids   Chest and Lung Exam Auscultation Breath sounds - clear at anterior chest wall and clear at posterior chest wall. Adventitious sounds - No Adventitious sounds.  Cardiovascular Auscultation Rhythm - Regular rate and rhythm. Heart Sounds - S1 WNL and S2 WNL. Murmurs & Other Heart Sounds - Auscultation  of the heart reveals - No Murmurs.  Abdomen Palpation/Percussion Tenderness - Abdomen is non-tender to palpation. Rigidity (guarding) - Abdomen is soft. Auscultation Auscultation of the abdomen reveals - Bowel sounds normal.  Male Genitourinary Note: Not done, not pertinent to present illness   Musculoskeletal Note: On exam, he is alert and oriented, in no apparent distress. Evaluation of his hips show normal range of motion, no discomfort. Both knees show no effusion. His right knee range of motion is about 5 to 125. Moderate crepitus on range of motion with tenderness medial greater than lateral and no instability. Left knee, no effusion. Range about 0 to 125. Slight tenderness medial greater than lateral. No instability. Pulse, sensation, motor intact. He does have an antalgic gait pattern on the right side.  RADIOGRAPHS AP both knees and lateral do show that he has significant bone-on-bone arthritis in the medial and patellofemoral compartments with right worse than left knee.   Assessment & Plan  Primary osteoarthritis of right knee (M17.11)  Note:Surgical Plans: Right Total Knee Replacement  Disposition: Home  PCP: Dr. Caryn Section  IV TXA  Anesthesia Issues: none  Veritas Study Patient Traditional Therapy  Signed electronically by Ok Edwards, III PA-C

## 2016-05-23 ENCOUNTER — Inpatient Hospital Stay (HOSPITAL_COMMUNITY): Payer: Medicare Other | Admitting: Certified Registered Nurse Anesthetist

## 2016-05-23 ENCOUNTER — Inpatient Hospital Stay (HOSPITAL_COMMUNITY)
Admission: RE | Admit: 2016-05-23 | Discharge: 2016-05-24 | DRG: 470 | Disposition: A | Discharge status: Home Health | Payer: Medicare Other | Source: Ambulatory Visit | Attending: Orthopedic Surgery | Admitting: Orthopedic Surgery

## 2016-05-23 ENCOUNTER — Encounter (HOSPITAL_COMMUNITY)
Admission: RE | Disposition: A | Discharge status: Home Health | Payer: Self-pay | Source: Ambulatory Visit | Attending: Orthopedic Surgery

## 2016-05-23 ENCOUNTER — Encounter (HOSPITAL_COMMUNITY): Payer: Self-pay | Admitting: *Deleted

## 2016-05-23 DIAGNOSIS — K219 Gastro-esophageal reflux disease without esophagitis: Secondary | ICD-10-CM | POA: Diagnosis present

## 2016-05-23 DIAGNOSIS — M1711 Unilateral primary osteoarthritis, right knee: Principal | ICD-10-CM | POA: Diagnosis present

## 2016-05-23 DIAGNOSIS — Z833 Family history of diabetes mellitus: Secondary | ICD-10-CM

## 2016-05-23 DIAGNOSIS — E785 Hyperlipidemia, unspecified: Secondary | ICD-10-CM | POA: Diagnosis not present

## 2016-05-23 DIAGNOSIS — M1710 Unilateral primary osteoarthritis, unspecified knee: Secondary | ICD-10-CM | POA: Diagnosis present

## 2016-05-23 DIAGNOSIS — M179 Osteoarthritis of knee, unspecified: Secondary | ICD-10-CM

## 2016-05-23 DIAGNOSIS — Z809 Family history of malignant neoplasm, unspecified: Secondary | ICD-10-CM

## 2016-05-23 DIAGNOSIS — Z8249 Family history of ischemic heart disease and other diseases of the circulatory system: Secondary | ICD-10-CM | POA: Diagnosis not present

## 2016-05-23 DIAGNOSIS — H9190 Unspecified hearing loss, unspecified ear: Secondary | ICD-10-CM | POA: Diagnosis not present

## 2016-05-23 DIAGNOSIS — M25561 Pain in right knee: Secondary | ICD-10-CM | POA: Diagnosis not present

## 2016-05-23 DIAGNOSIS — M549 Dorsalgia, unspecified: Secondary | ICD-10-CM | POA: Diagnosis not present

## 2016-05-23 DIAGNOSIS — M1712 Unilateral primary osteoarthritis, left knee: Secondary | ICD-10-CM | POA: Diagnosis present

## 2016-05-23 HISTORY — PX: TOTAL KNEE ARTHROPLASTY: SHX125

## 2016-05-23 LAB — TYPE AND SCREEN
ABO/RH(D): O POS
Antibody Screen: NEGATIVE

## 2016-05-23 SURGERY — ARTHROPLASTY, KNEE, TOTAL
Anesthesia: Spinal | Site: Knee | Laterality: Right

## 2016-05-23 MED ORDER — LACTATED RINGERS IV SOLN: INTRAVENOUS | Status: DC

## 2016-05-23 MED ORDER — TRANEXAMIC ACID 1000 MG/10ML IV SOLN
1000.0000 mg | INTRAVENOUS | Status: AC
  Administered 2016-05-23: 1000 mg via INTRAVENOUS
  Filled 2016-05-23: qty 10

## 2016-05-23 MED ORDER — DEXTROSE 5 % IV SOLN
500.0000 mg | Freq: Four times a day (QID) | INTRAVENOUS | Status: DC | PRN
  Filled 2016-05-23: qty 5

## 2016-05-23 MED ORDER — METHOCARBAMOL 500 MG PO TABS: 500.0000 mg | ORAL_TABLET | Freq: Four times a day (QID) | ORAL | Status: DC | PRN

## 2016-05-23 MED ORDER — TRAMADOL HCL 50 MG PO TABS: 50.0000 mg | ORAL_TABLET | Freq: Four times a day (QID) | ORAL | Status: DC | PRN

## 2016-05-23 MED ORDER — METOCLOPRAMIDE HCL 5 MG/ML IJ SOLN
5.0000 mg | Freq: Three times a day (TID) | INTRAMUSCULAR | Status: DC | PRN
Start: 2016-05-23 — End: 2016-05-24

## 2016-05-23 MED ORDER — ONDANSETRON HCL 4 MG/2ML IJ SOLN: 4.0000 mg | Freq: Four times a day (QID) | INTRAMUSCULAR | Status: DC | PRN

## 2016-05-23 MED ORDER — SODIUM CHLORIDE 0.9 % IJ SOLN
INTRAMUSCULAR | Status: AC
  Filled 2016-05-23: qty 50

## 2016-05-23 MED ORDER — RIVAROXABAN 10 MG PO TABS
10.0000 mg | ORAL_TABLET | Freq: Every day | ORAL | Status: DC
  Administered 2016-05-24: 10 mg via ORAL
  Filled 2016-05-23: qty 1

## 2016-05-23 MED ORDER — METOCLOPRAMIDE HCL 5 MG PO TABS: 5.0000 mg | ORAL_TABLET | Freq: Three times a day (TID) | ORAL | Status: DC | PRN

## 2016-05-23 MED ORDER — DIPHENHYDRAMINE HCL 12.5 MG/5ML PO ELIX
12.5000 mg | ORAL_SOLUTION | ORAL | Status: DC | PRN
Start: 2016-05-23 — End: 2016-05-24

## 2016-05-23 MED ORDER — PROMETHAZINE HCL 25 MG/ML IJ SOLN: 6.2500 mg | INTRAMUSCULAR | Status: DC | PRN

## 2016-05-23 MED ORDER — MEPERIDINE HCL 50 MG/ML IJ SOLN: 6.2500 mg | INTRAMUSCULAR | Status: DC | PRN

## 2016-05-23 MED ORDER — PROPOFOL 10 MG/ML IV BOLUS
INTRAVENOUS | Status: AC
  Filled 2016-05-23: qty 20

## 2016-05-23 MED ORDER — 0.9 % SODIUM CHLORIDE (POUR BTL) OPTIME
TOPICAL | Status: DC | PRN
  Administered 2016-05-23: 1000 mL

## 2016-05-23 MED ORDER — LORATADINE 10 MG PO TABS
10.0000 mg | ORAL_TABLET | Freq: Every day | ORAL | Status: DC
  Administered 2016-05-24: 10 mg via ORAL
  Filled 2016-05-23: qty 1

## 2016-05-23 MED ORDER — ACETAMINOPHEN 650 MG RE SUPP: 650.0000 mg | Freq: Four times a day (QID) | RECTAL | Status: DC | PRN

## 2016-05-23 MED ORDER — TRANEXAMIC ACID 1000 MG/10ML IV SOLN
1000.0000 mg | Freq: Once | INTRAVENOUS | Status: AC
  Administered 2016-05-23: 1000 mg via INTRAVENOUS
  Filled 2016-05-23: qty 10

## 2016-05-23 MED ORDER — HYDROMORPHONE HCL 1 MG/ML IJ SOLN: 0.5000 mg | INTRAMUSCULAR | Status: DC | PRN

## 2016-05-23 MED ORDER — HYDROMORPHONE HCL 1 MG/ML IJ SOLN: 0.2500 mg | INTRAMUSCULAR | Status: DC | PRN

## 2016-05-23 MED ORDER — POLYETHYLENE GLYCOL 3350 17 G PO PACK: 17.0000 g | PACK | Freq: Every day | ORAL | Status: DC | PRN

## 2016-05-23 MED ORDER — SODIUM CHLORIDE 0.9 % IJ SOLN
INTRAMUSCULAR | Status: DC | PRN
Start: 2016-05-23 — End: 2016-05-23
  Administered 2016-05-23: 30 mL

## 2016-05-23 MED ORDER — FENTANYL CITRATE (PF) 100 MCG/2ML IJ SOLN
INTRAMUSCULAR | Status: DC | PRN
  Administered 2016-05-23: 100 ug via INTRAVENOUS

## 2016-05-23 MED ORDER — SODIUM CHLORIDE 0.9 % IR SOLN
Status: DC | PRN
  Administered 2016-05-23: 1000 mL

## 2016-05-23 MED ORDER — PHENOL 1.4 % MT LIQD: 1.0000 | OROMUCOSAL | Status: DC | PRN

## 2016-05-23 MED ORDER — CHLORHEXIDINE GLUCONATE 4 % EX LIQD: 60.0000 mL | Freq: Once | CUTANEOUS | Status: DC

## 2016-05-23 MED ORDER — BUPIVACAINE LIPOSOME 1.3 % IJ SUSP
20.0000 mL | Freq: Once | INTRAMUSCULAR | Status: DC
  Filled 2016-05-23: qty 20

## 2016-05-23 MED ORDER — PROPOFOL 10 MG/ML IV BOLUS
INTRAVENOUS | Status: AC
  Filled 2016-05-23: qty 40

## 2016-05-23 MED ORDER — ACETAMINOPHEN 10 MG/ML IV SOLN
1000.0000 mg | Freq: Once | INTRAVENOUS | Status: AC
  Administered 2016-05-23: 1000 mg via INTRAVENOUS
  Filled 2016-05-23: qty 100

## 2016-05-23 MED ORDER — ONDANSETRON HCL 4 MG PO TABS: 4.0000 mg | ORAL_TABLET | Freq: Four times a day (QID) | ORAL | Status: DC | PRN

## 2016-05-23 MED ORDER — DEXAMETHASONE SODIUM PHOSPHATE 10 MG/ML IJ SOLN: 10.0000 mg | Freq: Once | INTRAMUSCULAR | Status: DC

## 2016-05-23 MED ORDER — BISACODYL 10 MG RE SUPP: 10.0000 mg | Freq: Every day | RECTAL | Status: DC | PRN

## 2016-05-23 MED ORDER — BUPIVACAINE HCL (PF) 0.75 % IJ SOLN
INTRAMUSCULAR | Status: DC | PRN
  Administered 2016-05-23: 2 mL via INTRATHECAL

## 2016-05-23 MED ORDER — FLEET ENEMA 7-19 GM/118ML RE ENEM: 1.0000 | ENEMA | Freq: Once | RECTAL | Status: DC | PRN

## 2016-05-23 MED ORDER — MENTHOL 3 MG MT LOZG: 1.0000 | LOZENGE | OROMUCOSAL | Status: DC | PRN

## 2016-05-23 MED ORDER — ACETAMINOPHEN 325 MG PO TABS: 650.0000 mg | ORAL_TABLET | Freq: Four times a day (QID) | ORAL | Status: DC | PRN

## 2016-05-23 MED ORDER — PROPOFOL 500 MG/50ML IV EMUL
INTRAVENOUS | Status: DC | PRN
  Administered 2016-05-23: 75 ug/kg/min via INTRAVENOUS

## 2016-05-23 MED ORDER — CEFAZOLIN SODIUM-DEXTROSE 2-4 GM/100ML-% IV SOLN
2.0000 g | INTRAVENOUS | Status: AC
  Administered 2016-05-23: 2 g via INTRAVENOUS
  Filled 2016-05-23: qty 100

## 2016-05-23 MED ORDER — BUPIVACAINE HCL 0.25 % IJ SOLN
INTRAMUSCULAR | Status: DC | PRN
  Administered 2016-05-23: 20 mL

## 2016-05-23 MED ORDER — BUPIVACAINE HCL (PF) 0.25 % IJ SOLN
INTRAMUSCULAR | Status: AC
  Filled 2016-05-23: qty 30

## 2016-05-23 MED ORDER — STERILE WATER FOR IRRIGATION IR SOLN
Status: DC | PRN
  Administered 2016-05-23: 2000 mL

## 2016-05-23 MED ORDER — CEFAZOLIN SODIUM-DEXTROSE 2-4 GM/100ML-% IV SOLN
2.0000 g | Freq: Four times a day (QID) | INTRAVENOUS | Status: AC
  Administered 2016-05-23 (×2): 2 g via INTRAVENOUS
  Filled 2016-05-23 (×2): qty 100

## 2016-05-23 MED ORDER — BUPIVACAINE LIPOSOME 1.3 % IJ SUSP
INTRAMUSCULAR | Status: DC | PRN
  Administered 2016-05-23: 20 mL

## 2016-05-23 MED ORDER — DOCUSATE SODIUM 100 MG PO CAPS
100.0000 mg | ORAL_CAPSULE | Freq: Two times a day (BID) | ORAL | Status: DC
  Administered 2016-05-23 – 2016-05-24 (×2): 100 mg via ORAL
  Filled 2016-05-23 (×2): qty 1

## 2016-05-23 MED ORDER — CEFAZOLIN SODIUM-DEXTROSE 2-4 GM/100ML-% IV SOLN
INTRAVENOUS | Status: AC
Start: 2016-05-23 — End: 2016-05-23
  Filled 2016-05-23: qty 100

## 2016-05-23 MED ORDER — LACTATED RINGERS IV SOLN
INTRAVENOUS | Status: DC | PRN
  Administered 2016-05-23 (×2): via INTRAVENOUS

## 2016-05-23 MED ORDER — SODIUM CHLORIDE 0.9 % IV SOLN
INTRAVENOUS | Status: DC
  Administered 2016-05-23 – 2016-05-24 (×2): via INTRAVENOUS

## 2016-05-23 MED ORDER — FENTANYL CITRATE (PF) 100 MCG/2ML IJ SOLN
INTRAMUSCULAR | Status: AC
  Filled 2016-05-23: qty 2

## 2016-05-23 MED ORDER — OXYCODONE HCL 5 MG PO TABS
5.0000 mg | ORAL_TABLET | ORAL | Status: DC | PRN
  Administered 2016-05-23 (×3): 5 mg via ORAL
  Administered 2016-05-24: 10 mg via ORAL
  Administered 2016-05-24: 5 mg via ORAL
  Administered 2016-05-24 (×3): 10 mg via ORAL
  Filled 2016-05-23 (×4): qty 1
  Filled 2016-05-23: qty 2
  Filled 2016-05-23: qty 1
  Filled 2016-05-23 (×2): qty 2
  Filled 2016-05-23: qty 1

## 2016-05-23 MED ORDER — ACETAMINOPHEN 500 MG PO TABS
1000.0000 mg | ORAL_TABLET | Freq: Four times a day (QID) | ORAL | Status: AC
  Administered 2016-05-23 – 2016-05-24 (×3): 1000 mg via ORAL
  Filled 2016-05-23 (×3): qty 2

## 2016-05-23 MED ORDER — DEXAMETHASONE SODIUM PHOSPHATE 10 MG/ML IJ SOLN
10.0000 mg | Freq: Once | INTRAMUSCULAR | Status: AC
  Administered 2016-05-24: 10 mg via INTRAVENOUS
  Filled 2016-05-23: qty 1

## 2016-05-23 MED ORDER — EPHEDRINE SULFATE 50 MG/ML IJ SOLN
INTRAMUSCULAR | Status: DC | PRN
  Administered 2016-05-23 (×3): 10 mg via INTRAVENOUS

## 2016-05-23 SURGICAL SUPPLY — 50 items
BAG DECANTER FOR FLEXI CONT (MISCELLANEOUS) ×2 IMPLANT
BAG ZIPLOCK 12X15 (MISCELLANEOUS) ×2 IMPLANT
BANDAGE ACE 6X5 VEL STRL LF (GAUZE/BANDAGES/DRESSINGS) ×2 IMPLANT
BLADE SAG 18X100X1.27 (BLADE) ×2 IMPLANT
BLADE SAW SGTL 11.0X1.19X90.0M (BLADE) ×2 IMPLANT
BOWL SMART MIX CTS (DISPOSABLE) ×2 IMPLANT
CAP KNEE TOTAL 3 SIGMA ×2 IMPLANT
CEMENT HV SMART SET (Cement) ×4 IMPLANT
CLOTH BEACON ORANGE TIMEOUT ST (SAFETY) ×2 IMPLANT
CUFF TOURN SGL QUICK 34 (TOURNIQUET CUFF) ×1
CUFF TRNQT CYL 34X4X40X1 (TOURNIQUET CUFF) ×1 IMPLANT
DECANTER SPIKE VIAL GLASS SM (MISCELLANEOUS) ×2 IMPLANT
DRAPE U-SHAPE 47X51 STRL (DRAPES) ×2 IMPLANT
DRSG ADAPTIC 3X8 NADH LF (GAUZE/BANDAGES/DRESSINGS) ×2 IMPLANT
DURAPREP 26ML APPLICATOR (WOUND CARE) ×2 IMPLANT
ELECT REM PT RETURN 9FT ADLT (ELECTROSURGICAL) ×2
ELECTRODE REM PT RTRN 9FT ADLT (ELECTROSURGICAL) ×1 IMPLANT
EVACUATOR 1/8 PVC DRAIN (DRAIN) ×2 IMPLANT
GAUZE SPONGE 4X4 12PLY STRL (GAUZE/BANDAGES/DRESSINGS) ×2 IMPLANT
GLOVE BIO SURGEON STRL SZ7.5 (GLOVE) ×2 IMPLANT
GLOVE BIO SURGEON STRL SZ8 (GLOVE) ×2 IMPLANT
GLOVE BIOGEL PI IND STRL 7.0 (GLOVE) ×1 IMPLANT
GLOVE BIOGEL PI IND STRL 7.5 (GLOVE) ×4 IMPLANT
GLOVE BIOGEL PI IND STRL 8 (GLOVE) ×2 IMPLANT
GLOVE BIOGEL PI INDICATOR 7.0 (GLOVE) ×1
GLOVE BIOGEL PI INDICATOR 7.5 (GLOVE) ×4
GLOVE BIOGEL PI INDICATOR 8 (GLOVE) ×2
GLOVE SURG SS PI 7.0 STRL IVOR (GLOVE) ×2 IMPLANT
GLOVE SURG SS PI 7.5 STRL IVOR (GLOVE) ×2 IMPLANT
GOWN STRL REUS W/ TWL XL LVL3 (GOWN DISPOSABLE) ×1 IMPLANT
GOWN STRL REUS W/TWL LRG LVL3 (GOWN DISPOSABLE) ×2 IMPLANT
GOWN STRL REUS W/TWL XL LVL3 (GOWN DISPOSABLE) ×5 IMPLANT
HANDPIECE INTERPULSE COAX TIP (DISPOSABLE) ×1
IMMOBILIZER KNEE 20 (SOFTGOODS) ×2
IMMOBILIZER KNEE 20 THIGH 36 (SOFTGOODS) ×1 IMPLANT
MANIFOLD NEPTUNE II (INSTRUMENTS) ×2 IMPLANT
PACK TOTAL KNEE CUSTOM (KITS) ×2 IMPLANT
PAD ABD 8X10 STRL (GAUZE/BANDAGES/DRESSINGS) ×2 IMPLANT
PADDING CAST COTTON 6X4 STRL (CAST SUPPLIES) ×6 IMPLANT
POSITIONER SURGICAL ARM (MISCELLANEOUS) ×2 IMPLANT
SET HNDPC FAN SPRY TIP SCT (DISPOSABLE) ×1 IMPLANT
STRIP CLOSURE SKIN 1/2X4 (GAUZE/BANDAGES/DRESSINGS) ×4 IMPLANT
SUT MNCRL AB 4-0 PS2 18 (SUTURE) ×2 IMPLANT
SUT VIC AB 2-0 CT1 27 (SUTURE) ×3
SUT VIC AB 2-0 CT1 TAPERPNT 27 (SUTURE) ×3 IMPLANT
SUT VLOC 180 0 24IN GS25 (SUTURE) ×2 IMPLANT
SYR 50ML LL SCALE MARK (SYRINGE) ×2 IMPLANT
TRAY FOLEY W/METER SILVER 16FR (SET/KITS/TRAYS/PACK) ×2 IMPLANT
WRAP KNEE MAXI GEL POST OP (GAUZE/BANDAGES/DRESSINGS) ×2 IMPLANT
YANKAUER SUCT BULB TIP 10FT TU (MISCELLANEOUS) ×2 IMPLANT

## 2016-05-23 NOTE — Transfer of Care (Signed)
Immediate Anesthesia Transfer of Care Note  Patient: Alexander Moss  Procedure(s) Performed: Procedure(s): RIGHT TOTAL KNEE ARTHROPLASTY (Right)  Patient Location: PACU  Anesthesia Type:Spinal  Level of Consciousness: awake, alert  and oriented  Airway & Oxygen Therapy: Patient Spontanous Breathing and Patient connected to face mask oxygen  Post-op Assessment: Report given to RN and Post -op Vital signs reviewed and stable  Post vital signs: Reviewed and stable  Last Vitals:  Vitals:   05/23/16 0600  BP: (!) 131/93  Pulse: 70  Resp: 18  Temp: 36.6 C    Last Pain:  Vitals:   05/23/16 0644  TempSrc:   PainSc: 0-No pain      Patients Stated Pain Goal: 4 ([id number])  Complications: No apparent anesthesia complications

## 2016-05-23 NOTE — Anesthesia Preprocedure Evaluation (Addendum)
Anesthesia Evaluation  Patient identified by MRN, date of birth, ID band Patient awake    Reviewed: Allergy & Precautions, NPO status , Patient's Chart, lab work & pertinent test results  Airway Mallampati: I  TM Distance: >3 FB Neck ROM: Full    Dental  (+) Teeth Intact, Dental Advisory Given   Pulmonary former smoker,    breath sounds clear to auscultation       Cardiovascular negative cardio ROS   Rhythm:Regular Rate:Normal     Neuro/Psych negative neurological ROS  negative psych ROS   GI/Hepatic Neg liver ROS, GERD  ,  Endo/Other  negative endocrine ROS  Renal/GU negative Renal ROS  negative genitourinary   Musculoskeletal  (+) Arthritis , Osteoarthritis,    Abdominal   Peds negative pediatric ROS (+)  Hematology negative hematology ROS (+)   Anesthesia Other Findings   Reproductive/Obstetrics negative OB ROS                            Lab Results  Component Value Date   WBC 6.6 05/13/2016   HGB 15.7 05/13/2016   HCT 45.4 05/13/2016   MCV 95.2 05/13/2016   PLT 266 05/13/2016   Lab Results  Component Value Date   INR 1.00 05/13/2016   EKG: normal sinus rhythm, 1st degree AV block.  Anesthesia Physical Anesthesia Plan  ASA: II  Anesthesia Plan: Spinal   Post-op Pain Management:    Induction: Intravenous  Airway Management Planned: Natural Airway  Additional Equipment:   Intra-op Plan:   Post-operative Plan:   Informed Consent: I have reviewed the patients History and Physical, chart, labs and discussed the procedure including the risks, benefits and alternatives for the proposed anesthesia with the patient or authorized representative who has indicated his/her understanding and acceptance.     Plan Discussed with: CRNA  Anesthesia Plan Comments:         Anesthesia Quick Evaluation

## 2016-05-23 NOTE — H&P (View-Only) (Signed)
Alexander Moss DOB: 02-22-1946 Married / Language: Alexander Moss / Race: White Male Date of Admission:  05/23/2016 CC:  Right knee pain History of Present Illness The patient is a 70 year old male who comes in for a preoperative History and Physical. The patient is scheduled for a right total knee arthroplasty to be performed by Dr. Dione Plover. Aluisio, MD at [facility] on 05-23-2016. The patient was seen as a second opinion. The patient reports right knee (greater than left) symptoms including: pain which began year(s) ago without any known injury (states due to his job he was kneeling a lot).The patient feels that the symptoms are worsening (especially over the past 6 months). Previous work-up for this problem has included none (recently, history of left knee arthroscopy in 2007 and right knee in 2008). Symptoms are reported to be located in the left knee and right knee and include knee pain (sharp off and on), stiffness (after sitting), instability (occasionally) and difficulty ambulating (after walking for longer periods), while symptoms reported do not include swelling. Current treatment includes nonsteroidal anti-inflammatory drugs (Aleve). His right knee has gotten progressively worse over time. He is at a stage now where it is bothering him with most activities. He would like to be more active, but the right knee is preventing him from doing so. Left knee bothers him, but to a lesser degree. He has had injection in the past without much benefit. He is looking for more permanent solution to his knee problem at this time. He is ready to proceed with surgery. They have been treated conservatively in the past for the above stated problem and despite conservative measures, they continue to have progressive pain and severe functional limitations and dysfunction. They have failed non-operative management including home exercise, medications, and injections. It is felt that they would benefit from  undergoing total joint replacement. Risks and benefits of the procedure have been discussed with the patient and they elect to proceed with surgery. There are no active contraindications to surgery such as ongoing infection or rapidly progressive neurological disease.  Problem List/Past Medical  Primary osteoarthritis of both knees (M17.0)  Gastroesophageal Reflux Disease  Impaired Hearing  Measles  Mumps  Allergies No Known Drug Allergies  Family History Cancer  Father, Sister. Diabetes Mellitus  Mother. First Degree Relatives  reported Heart Disease  Father. Heart disease in male family member before age 72  Hypertension  Mother.  Social History Children  1 Current drinker  12/31/2015: Currently drinks beer and hard liquor less than 5 times per week Current work status  retired Furniture conservator/restorer weekly; does gym / [employer] Living situation  live with spouse Marital status  married No history of drug/alcohol rehab  Not under pain contract  Number of flights of stairs before winded  2-3 Tobacco / smoke exposure  12/31/2015: no Tobacco use  Former smoker. 12/31/2015: smoke(d) 1 1/2 pack(s) per day Advance Directives  Living Will, [place] with wife following the Right Total Knee Arthroplasty to be performed on 05/23/2016.  Medication History  Aleve (220MG  Capsule, 1 (one) Oral) Active. Vitamins/Minerals (Oral) Active. Aspirin (81MG  Tablet, Oral) Active. Fish Oil (Oral) Specific strength unknown - Active. Fexofenadine HCl (60MG  Tablet, Oral) Active.  Past Surgical History Arthroscopy of Knee  bilateral Hemorrhoidectomy  Tonsillectomy    Review of Systems General Not Present- Chills, Fatigue, Fever, Memory Loss, Night Sweats, Weight Gain and Weight Loss. Skin Not Present- Eczema, Hives, Itching, Lesions and Rash.  HEENT Not Present- Dentures, Double Vision, Headache, Hearing Loss, Tinnitus and Visual  Loss. Respiratory Not Present- Allergies, Chronic Cough, Coughing up blood, Shortness of breath at rest and Shortness of breath with exertion. Cardiovascular Not Present- Chest Pain, Difficulty Breathing Lying Down, Murmur, Palpitations, Racing/skipping heartbeats and Swelling. Gastrointestinal Not Present- Abdominal Pain, Bloody Stool, Constipation, Diarrhea, Difficulty Swallowing, Heartburn, Jaundice, Loss of appetitie, Nausea and Vomiting. Male Genitourinary Present- Urinary frequency. Not Present- Blood in Urine, Discharge, Flank Pain, Incontinence, Painful Urination, Urgency, Urinary Retention, Urinating at Night and Weak urinary stream. Musculoskeletal Present- Joint Pain. Not Present- Back Pain, Joint Swelling, Morning Stiffness, Muscle Pain, Muscle Weakness and Spasms. Neurological Not Present- Blackout spells, Difficulty with balance, Dizziness, Paralysis, Tremor and Weakness. Psychiatric Not Present- Insomnia.  Vitals Weight: 225 lb Height: 68in Body Surface Area: 2.15 m Body Mass Index: 34.21 kg/m  Pulse: 56 (Regular)  BP: 124/78 (Sitting, Left Arm, Standard)  Physical Exam General Mental Status -Alert, cooperative and good historian. General Appearance-pleasant, Not in acute distress. Orientation-Oriented X3. Build & Nutrition-Well nourished and Well developed.  Head and Neck Head-normocephalic, atraumatic . Neck Global Assessment - supple, no bruit auscultated on the right, no bruit auscultated on the left.  Eye Vision-Wears corrective lenses. Pupil - Bilateral-Regular and Round. Motion - Bilateral-EOMI.  ENMT Note: bilateral hearing aids   Chest and Lung Exam Auscultation Breath sounds - clear at anterior chest wall and clear at posterior chest wall. Adventitious sounds - No Adventitious sounds.  Cardiovascular Auscultation Rhythm - Regular rate and rhythm. Heart Sounds - S1 WNL and S2 WNL. Murmurs & Other Heart Sounds - Auscultation  of the heart reveals - No Murmurs.  Abdomen Palpation/Percussion Tenderness - Abdomen is non-tender to palpation. Rigidity (guarding) - Abdomen is soft. Auscultation Auscultation of the abdomen reveals - Bowel sounds normal.  Male Genitourinary Note: Not done, not pertinent to present illness   Musculoskeletal Note: On exam, he is alert and oriented, in no apparent distress. Evaluation of his hips show normal range of motion, no discomfort. Both knees show no effusion. His right knee range of motion is about 5 to 125. Moderate crepitus on range of motion with tenderness medial greater than lateral and no instability. Left knee, no effusion. Range about 0 to 125. Slight tenderness medial greater than lateral. No instability. Pulse, sensation, motor intact. He does have an antalgic gait pattern on the right side.  RADIOGRAPHS AP both knees and lateral do show that he has significant bone-on-bone arthritis in the medial and patellofemoral compartments with right worse than left knee.   Assessment & Plan  Primary osteoarthritis of right knee (M17.11)  Note:Surgical Plans: Right Total Knee Replacement  Disposition: Home  PCP: Dr. Caryn Section  IV TXA  Anesthesia Issues: none  Veritas Study Patient Traditional Therapy  Signed electronically by Ok Edwards, III PA-C

## 2016-05-23 NOTE — Anesthesia Procedure Notes (Signed)
Spinal  Start time: 05/23/2016 8:01 AM End time: 05/23/2016 8:07 AM Staffing Resident/CRNA: Gean Maidens Performed: resident/CRNA  Preanesthetic Checklist Completed: patient identified, site marked, surgical consent, pre-op evaluation, timeout performed, IV checked, risks and benefits discussed and monitors and equipment checked Spinal Block Patient position: sitting Prep: Betadine Patient monitoring: heart rate, continuous pulse ox and blood pressure Approach: midline Location: L3-4 Injection technique: single-shot Needle Needle type: Spinocan  Needle gauge: 22 G Needle length: 9 cm Needle insertion depth: 8 cm Additional Notes Pt sitting position, sterile prep and drape, negative heme, paresthesia.

## 2016-05-23 NOTE — Interval H&P Note (Signed)
History and Physical Interval Note:  05/23/2016 6:47 AM  [name]  has presented today for surgery, with the diagnosis of right knee osteoarthritis  The various methods of treatment have been discussed with the patient and family. After consideration of risks, benefits and other options for treatment, the patient has consented to  Procedure(s): RIGHT TOTAL KNEE ARTHROPLASTY (Right) as a surgical intervention .  The patient's history has been reviewed, patient examined, no change in status, stable for surgery.  I have reviewed the patient's chart and labs.  Questions were answered to the patient's satisfaction.     Gearlean Alf

## 2016-05-23 NOTE — Anesthesia Postprocedure Evaluation (Signed)
Anesthesia Post Note  Patient: Alexander Moss  Procedure(s) Performed: Procedure(s) (LRB): RIGHT TOTAL KNEE ARTHROPLASTY (Right)  Patient location during evaluation: PACU Anesthesia Type: Spinal Level of consciousness: oriented and awake and alert Pain management: pain level controlled Vital Signs Assessment: post-procedure vital signs reviewed and stable Respiratory status: spontaneous breathing, respiratory function stable and patient connected to nasal cannula oxygen Cardiovascular status: blood pressure returned to baseline and stable Postop Assessment: no headache, no backache and spinal receding Anesthetic complications: no    Last Vitals:  Vitals:   05/23/16 1136 05/23/16 1236  BP: 127/67 132/75  Pulse: 76 75  Resp: 19 16  Temp: 36.4 C 36.4 C    Last Pain:  Vitals:   05/23/16 1236  TempSrc: Axillary  PainSc: 2                  Effie Berkshire

## 2016-05-23 NOTE — Evaluation (Signed)
Physical Therapy Evaluation Patient Details Name: Alexander Moss MRN: [id number] DOB: Mar 03, 1946 Today's Date: 05/23/2016   History of Present Illness  s/p R TKA  Clinical Impression  Pt is s/p TKA resulting in the deficits listed below (see PT Problem List). * Pt will benefit from skilled PT to increase their independence and safety with mobility to allow discharge to the venue listed below.      Follow Up Recommendations Home health PT;Outpatient PT    Equipment Recommendations  Rolling walker with 5" wheels    Recommendations for Other Services       Precautions / Restrictions Precautions Precautions: Knee Required Braces or Orthoses: Knee Immobilizer - Right Knee Immobilizer - Right: Discontinue once straight leg raise with < 10 degree lag Restrictions Weight Bearing Restrictions: No Other Position/Activity Restrictions: WBAT      Mobility  Bed Mobility Overal bed mobility: Needs Assistance Bed Mobility: Supine to Sit     Supine to sit: Supervision     General bed mobility comments: for safety  Transfers Overall transfer level: Needs assistance Equipment used: Rolling walker (2 wheeled) Transfers: Sit to/from Stand Sit to Stand: Min assist         General transfer comment: to rise and stabilize  Ambulation/Gait Ambulation/Gait assistance: Min assist Ambulation Distance (Feet): 55 Feet Assistive device: Rolling walker (2 wheeled) Gait Pattern/deviations: Step-to pattern;Decreased stance time - right     General Gait Details: cues for sequence and RW safety  Stairs            Wheelchair Mobility    Modified Rankin (Stroke Patients Only)       Balance                                             Pertinent Vitals/Pain Pain Assessment: No/denies pain    Home Living Family/patient expects to be discharged to:: Private residence Living Arrangements: Spouse/significant other Available Help at Discharge:  Family;Available 24 hours/day Type of Home: House Home Access: Stairs to enter Entrance Stairs-Rails: None Entrance Stairs-Number of Steps: 2-3 Home Layout: Two level;1/2 bath on main level Home Equipment: None Additional Comments: plans to borrow RW    Prior Function Level of Independence: Independent               Hand Dominance        Extremity/Trunk Assessment   Upper Extremity Assessment: Defer to OT evaluation;Overall WFL for tasks assessed           Lower Extremity Assessment: RLE deficits/detail RLE Deficits / Details: ankle WFL; knee extension with hip flexion 3/5; knee AROM grossly -10 to 60*       Communication   Communication: No difficulties  Cognition Arousal/Alertness: Awake/alert Behavior During Therapy: WFL for tasks assessed/performed Overall Cognitive Status: Within Functional Limits for tasks assessed                      General Comments      Exercises Total Joint Exercises Ankle Circles/Pumps: AROM;Both;10 reps Quad Sets: 10 reps;Both;AROM   Assessment/Plan    PT Assessment Patient needs continued PT services  PT Problem List Decreased strength;Decreased activity tolerance;Decreased balance;Decreased mobility;Decreased knowledge of use of DME;Decreased range of motion          PT Treatment Interventions DME instruction;Gait training;Functional mobility training;Therapeutic activities;Therapeutic exercise;Patient/family education;Stair training    PT Goals (  Current goals can be found in the Care Plan section)  Acute Rehab PT Goals Patient Stated Goal: back to IND PT Goal Formulation: With patient Time For Goal Achievement: 05/26/16 Potential to Achieve Goals: Good    Frequency 7X/week   Barriers to discharge        Co-evaluation               End of Session Equipment Utilized During Treatment: Gait belt Activity Tolerance: Patient tolerated treatment well Patient left: in chair;with call bell/phone  within reach;with family/visitor present           Time: 1516-1540 PT Time Calculation (min) (ACUTE ONLY): 24 min   Charges:   PT Evaluation $PT Eval Low Complexity: 1 Procedure PT Treatments $Gait Training: 8-22 mins   PT G Codes:        Alexander Moss Jun 06, 2016, 3:54 PM

## 2016-05-23 NOTE — Op Note (Signed)
OPERATIVE REPORT-TOTAL KNEE ARTHROPLASTY   Pre-operative diagnosis- Osteoarthritis  Right knee(s)  Post-operative diagnosis- Osteoarthritis Right knee(s)  Procedure-  Right  Total Knee Arthroplasty  Surgeon- Dione Plover. Davinder Haff, MD  Assistant- Arlee Muslim, PA-C   Anesthesia-  Spinal  EBL-* No blood loss amount entered *   Drains Hemovac  Tourniquet time-  Total Tourniquet Time Documented: Thigh (Right) - 34 minutes Total: Thigh (Right) - 34 minutes     Complications- None  Condition-PACU - hemodynamically stable.   Brief Clinical Note  Alexander Moss is a 70 y.o. year old male with end stage OA of his right knee with progressively worsening pain and dysfunction. He has constant pain, with activity and at rest and significant functional deficits with difficulties even with ADLs. He has had extensive non-op management including analgesics, injections of cortisone and viscosupplements, and home exercise program, but remains in significant pain with significant dysfunction. Radiographs show bone on bone arthritis medial and patellofemoral. He presents now for right Total Knee Arthroplasty.    Procedure in detail---   The patient is brought into the operating room and positioned supine on the operating table. After successful administration of  Spinal,   a tourniquet is placed high on the  Right thigh(s) and the lower extremity is prepped and draped in the usual sterile fashion. Time out is performed by the operating team and then the  Right lower extremity is wrapped in Esmarch, knee flexed and the tourniquet inflated to 300 mmHg.       A midline incision is made with a ten blade through the subcutaneous tissue to the level of the extensor mechanism. A fresh blade is used to make a medial parapatellar arthrotomy. Soft tissue over the proximal medial tibia is subperiosteally elevated to the joint line with a knife and into the semimembranosus bursa with a Cobb elevator. Soft tissue  over the proximal lateral tibia is elevated with attention being paid to avoiding the patellar tendon on the tibial tubercle. The patella is everted, knee flexed 90 degrees and the ACL and PCL are removed. Findings are bone on bone medial and patellofemoral with large global osteophytes.        The drill is used to create a starting hole in the distal femur and the canal is thoroughly irrigated with sterile saline to remove the fatty contents. The 5 degree Right  valgus alignment guide is placed into the femoral canal and the distal femoral cutting block is pinned to remove 10 mm off the distal femur. Resection is made with an oscillating saw.      The tibia is subluxed forward and the menisci are removed. The extramedullary alignment guide is placed referencing proximally at the medial aspect of the tibial tubercle and distally along the second metatarsal axis and tibial crest. The block is pinned to remove 63mm off the more deficient medial  side. Resection is made with an oscillating saw. Size 4is the most appropriate size for the tibia and the proximal tibia is prepared with the modular drill and keel punch for that size.      The femoral sizing guide is placed and size 5 is most appropriate. Rotation is marked off the epicondylar axis and confirmed by creating a rectangular flexion gap at 90 degrees. The size 5 cutting block is pinned in this rotation and the anterior, posterior and chamfer cuts are made with the oscillating saw. The intercondylar block is then placed and that cut is made.  Trial size 4 tibial component, trial size 5 posterior stabilized femur and a 12.5  mm posterior stabilized rotating platform insert trial is placed. Full extension is achieved with excellent varus/valgus and anterior/posterior balance throughout full range of motion. The patella is everted and thickness measured to be 27  mm. Free hand resection is taken to 15 mm, a 41 template is placed, lug holes are drilled, trial  patella is placed, and it tracks normally. Osteophytes are removed off the posterior femur with the trial in place. All trials are removed and the cut bone surfaces prepared with pulsatile lavage. Cement is mixed and once ready for implantation, the size 4 tibial implant, size  5 posterior stabilized femoral component, and the size 41 patella are cemented in place and the patella is held with the clamp. The trial insert is placed and the knee held in full extension. The Exparel (20 ml mixed with 30 ml saline) and .25% Bupivicaine, are injected into the extensor mechanism, posterior capsule, medial and lateral gutters and subcutaneous tissues.  All extruded cement is removed and once the cement is hard the permanent 12.5 mm posterior stabilized rotating platform insert is placed into the tibial tray.      The wound is copiously irrigated with saline solution and the extensor mechanism closed over a hemovac drain with #1 V-loc suture. The tourniquet is released for a total tourniquet time of 36  minutes. Flexion against gravity is 140 degrees and the patella tracks normally. Subcutaneous tissue is closed with 2.0 vicryl and subcuticular with running 4.0 Monocryl. The incision is cleaned and dried and steri-strips and a bulky sterile dressing are applied. The limb is placed into a knee immobilizer and the patient is awakened and transported to recovery in stable condition.      Please note that a surgical assistant was a medical necessity for this procedure in order to perform it in a safe and expeditious manner. Surgical assistant was necessary to retract the ligaments and vital neurovascular structures to prevent injury to them and also necessary for proper positioning of the limb to allow for anatomic placement of the prosthesis.   Dione Plover Tabias Swayze, MD    05/23/2016, 9:02 AM

## 2016-05-24 LAB — BASIC METABOLIC PANEL
ANION GAP: 6 (ref 5–15)
BUN: 16 mg/dL (ref 6–20)
CALCIUM: 8.5 mg/dL — AB (ref 8.9–10.3)
CO2: 25 mmol/L (ref 22–32)
Chloride: 104 mmol/L (ref 101–111)
Creatinine, Ser: 0.99 mg/dL (ref 0.61–1.24)
Glucose, Bld: 152 mg/dL — ABNORMAL HIGH (ref 65–99)
POTASSIUM: 4.3 mmol/L (ref 3.5–5.1)
Sodium: 135 mmol/L (ref 135–145)

## 2016-05-24 LAB — CBC
HCT: 35.5 % — ABNORMAL LOW (ref 39.0–52.0)
Hemoglobin: 12.9 g/dL — ABNORMAL LOW (ref 13.0–17.0)
MCH: 33.7 pg (ref 26.0–34.0)
MCHC: 36.3 g/dL — ABNORMAL HIGH (ref 30.0–36.0)
MCV: 92.7 fL (ref 78.0–100.0)
PLATELETS: 225 10*3/uL (ref 150–400)
RBC: 3.83 MIL/uL — AB (ref 4.22–5.81)
RDW: 12.9 % (ref 11.5–15.5)
WBC: 14.7 10*3/uL — AB (ref 4.0–10.5)

## 2016-05-24 MED ORDER — RIVAROXABAN 10 MG PO TABS: 10.0000 mg | ORAL_TABLET | Freq: Every day | ORAL | 0 refills | Status: DC

## 2016-05-24 MED ORDER — METHOCARBAMOL 500 MG PO TABS: 500.0000 mg | ORAL_TABLET | Freq: Four times a day (QID) | ORAL | 0 refills | Status: DC | PRN

## 2016-05-24 MED ORDER — OXYCODONE HCL 5 MG PO TABS: 5.0000 mg | ORAL_TABLET | ORAL | 0 refills | Status: DC | PRN

## 2016-05-24 MED ORDER — TRAMADOL HCL 50 MG PO TABS: 50.0000 mg | ORAL_TABLET | Freq: Four times a day (QID) | ORAL | 1 refills | Status: DC | PRN

## 2016-05-24 MED ORDER — ALUM & MAG HYDROXIDE-SIMETH 200-200-20 MG/5ML PO SUSP
30.0000 mL | ORAL | Status: DC | PRN
  Administered 2016-05-24: 30 mL via ORAL
  Filled 2016-05-24: qty 30

## 2016-05-24 NOTE — Discharge Instructions (Addendum)
° °Dr. Frank Aluisio °Total Joint Specialist °[facility] °[address] °[place], [place] [address] °[phone] ° °TOTAL KNEE REPLACEMENT POSTOPERATIVE DIRECTIONS ° °Knee Rehabilitation, Guidelines Following Surgery  °Results after knee surgery are often greatly improved when you follow the exercise, range of motion and muscle strengthening exercises prescribed by your doctor. Safety measures are also important to protect the knee from further injury. Any time any of these exercises cause you to have increased pain or swelling in your knee joint, decrease the amount until you are comfortable again and slowly increase them. If you have problems or questions, call your caregiver or physical therapist for advice.  ° °HOME CARE INSTRUCTIONS  °Remove items at home which could result in a fall. This includes throw rugs or furniture in walking pathways.  °· ICE to the affected knee every three hours for 30 minutes at a time and then as needed for pain and swelling.  Continue to use ice on the knee for pain and swelling from surgery. You may notice swelling that will progress down to the foot and ankle.  This is normal after surgery.  Elevate the leg when you are not up walking on it.   °· Continue to use the breathing machine which will help keep your temperature down.  It is common for your temperature to cycle up and down following surgery, especially at night when you are not up moving around and exerting yourself.  The breathing machine keeps your lungs expanded and your temperature down. °· Do not place pillow under knee, focus on keeping the knee straight while resting ° °DIET °You may resume your previous home diet once your are discharged from the hospital. ° °DRESSING / WOUND CARE / SHOWERING °You may shower 3 days after surgery, but keep the wounds dry during showering.  You may use an occlusive plastic wrap (Press'n Seal for example), NO SOAKING/SUBMERGING IN THE BATHTUB.  If the  bandage gets wet, change with a clean dry gauze.  If the incision gets wet, pat the wound dry with a clean towel. °You may start showering once you are discharged home but do not submerge the incision under water. Just pat the incision dry and apply a dry gauze dressing on daily. °Change the surgical dressing daily and reapply a dry dressing each time. ° °ACTIVITY °Walk with your walker as instructed. °Use walker as long as suggested by your caregivers. °Avoid periods of inactivity such as sitting longer than an hour when not asleep. This helps prevent blood clots.  °You may resume a sexual relationship in one month or when given the OK by your doctor.  °You may return to work once you are cleared by your doctor.  °Do not drive a car for 6 weeks or until released by you surgeon.  °Do not drive while taking narcotics. ° °WEIGHT BEARING °Weight bearing as tolerated with assist device (walker, cane, etc) as directed, use it as long as suggested by your surgeon or therapist, typically at least 4-6 weeks. ° °POSTOPERATIVE CONSTIPATION PROTOCOL °Constipation - defined medically as fewer than three stools per week and severe constipation as less than one stool per week. ° °One of the most common issues patients have following surgery is constipation.  Even if you have a regular bowel pattern at home, your normal regimen is likely to be disrupted due to multiple reasons following surgery.  Combination of anesthesia, postoperative narcotics, change in appetite and fluid intake all can affect your bowels.    In order to avoid complications following surgery, here are some recommendations in order to help you during your recovery period. ° °Colace (docusate) - Pick up an over-the-counter form of Colace or another stool softener and take twice a day as long as you are requiring postoperative pain medications.  Take with a full glass of water daily.  If you experience loose stools or diarrhea, hold the colace until you stool forms  back up.  If your symptoms do not get better within 1 week or if they get worse, check with your doctor. ° °Dulcolax (bisacodyl) - Pick up over-the-counter and take as directed by the product packaging as needed to assist with the movement of your bowels.  Take with a full glass of water.  Use this product as needed if not relieved by Colace only.  ° °MiraLax (polyethylene glycol) - Pick up over-the-counter to have on hand.  MiraLax is a solution that will increase the amount of water in your bowels to assist with bowel movements.  Take as directed and can mix with a glass of water, juice, soda, coffee, or tea.  Take if you go more than two days without a movement. °Do not use MiraLax more than once per day. Call your doctor if you are still constipated or irregular after using this medication for 7 days in a row. ° °If you continue to have problems with postoperative constipation, please contact the office for further assistance and recommendations.  If you experience "the worst abdominal pain ever" or develop nausea or vomiting, please contact the office immediatly for further recommendations for treatment. ° °ITCHING ° If you experience itching with your medications, try taking only a single pain pill, or even half a pain pill at a time.  You can also use Benadryl over the counter for itching or also to help with sleep.  ° °TED HOSE STOCKINGS °Wear the elastic stockings on both legs for three weeks following surgery during the day but you may remove then at night for sleeping. ° °MEDICATIONS °See your medication summary on the “After Visit Summary” that the nursing staff will review with you prior to discharge.  You may have some home medications which will be placed on hold until you complete the course of blood thinner medication.  It is important for you to complete the blood thinner medication as prescribed by your surgeon.  Continue your approved medications as instructed at time of  discharge. ° °PRECAUTIONS °If you experience chest pain or shortness of breath - call 911 immediately for transfer to the hospital emergency department.  °If you develop a fever greater that 101 F, purulent drainage from wound, increased redness or drainage from wound, foul odor from the wound/dressing, or calf pain - CONTACT YOUR SURGEON.   °                                                °FOLLOW-UP APPOINTMENTS °Make sure you keep all of your appointments after your operation with your surgeon and caregivers. You should call the office at the above phone number and make an appointment for approximately two weeks after the date of your surgery or on the date instructed by your surgeon outlined in the "After Visit Summary". ° ° °RANGE OF MOTION AND STRENGTHENING EXERCISES  °Rehabilitation of the knee is important following a knee injury or   an operation. After just a few days of immobilization, the muscles of the thigh which control the knee become weakened and shrink (atrophy). Knee exercises are designed to build up the tone and strength of the thigh muscles and to improve knee motion. Often times heat used for twenty to thirty minutes before working out will loosen up your tissues and help with improving the range of motion but do not use heat for the first two weeks following surgery. These exercises can be done on a training (exercise) mat, on the floor, on a table or on a bed. Use what ever works the best and is most comfortable for you Knee exercises include:  °Leg Lifts - While your knee is still immobilized in a splint or cast, you can do straight leg raises. Lift the leg to 60 degrees, hold for 3 sec, and slowly lower the leg. Repeat 10-20 times 2-3 times daily. Perform this exercise against resistance later as your knee gets better.  °Quad and Hamstring Sets - Tighten up the muscle on the front of the thigh (Quad) and hold for 5-10 sec. Repeat this 10-20 times hourly. Hamstring sets are done by pushing the  foot backward against an object and holding for 5-10 sec. Repeat as with quad sets.  °· Leg Slides: Lying on your back, slowly slide your foot toward your buttocks, bending your knee up off the floor (only go as far as is comfortable). Then slowly slide your foot back down until your leg is flat on the floor again. °· Angel Wings: Lying on your back spread your legs to the side as far apart as you can without causing discomfort.  °A rehabilitation program following serious knee injuries can speed recovery and prevent re-injury in the future due to weakened muscles. Contact your doctor or a physical therapist for more information on knee rehabilitation.  ° °IF YOU ARE TRANSFERRED TO A SKILLED REHAB FACILITY °If the patient is transferred to a skilled rehab facility following release from the hospital, a list of the current medications will be sent to the facility for the patient to continue.  When discharged from the skilled rehab facility, please have the facility set up the patient's Home Health Physical Therapy prior to being released. Also, the skilled facility will be responsible for providing the patient with their medications at time of release from the facility to include their pain medication, the muscle relaxants, and their blood thinner medication. If the patient is still at the rehab facility at time of the two week follow up appointment, the skilled rehab facility will also need to assist the patient in arranging follow up appointment in our office and any transportation needs. ° °MAKE SURE YOU:  °Understand these instructions.  °Get help right away if you are not doing well or get worse.  ° ° °Pick up stool softner and laxative for home use following surgery while on pain medications. °Do not submerge incision under water. °Please use good hand washing techniques while changing dressing each day. °May shower starting three days after surgery. °Please use a clean towel to pat the incision dry following  showers. °Continue to use ice for pain and swelling after surgery. °Do not use any lotions or creams on the incision until instructed by your surgeon. ° °Take Xarelto for two and a half more weeks, then discontinue Xarelto. °Once the patient has completed the Xarelto, they may resume the 81 mg Aspirin. ° ° ° °Information on my medicine - XARELTO® (Rivaroxaban) ° °  This medication education was reviewed with me or my healthcare representative as part of my discharge preparation.  The pharmacist that spoke with me during my hospital stay was:  Dessie Delcarlo, RPH ° °Why was Xarelto® prescribed for you? °Xarelto® was prescribed for you to reduce the risk of blood clots forming after orthopedic surgery. The medical term for these abnormal blood clots is venous thromboembolism (VTE). ° °What do you need to know about xarelto® ? °Take your Xarelto® ONCE DAILY at the same time every day. °You may take it either with or without food. ° °If you have difficulty swallowing the tablet whole, you may crush it and mix in applesauce just prior to taking your dose. ° °Take Xarelto® exactly as prescribed by your doctor and DO NOT stop taking Xarelto® without talking to the doctor who prescribed the medication.  Stopping without other VTE prevention medication to take the place of Xarelto® may increase your risk of developing a clot. ° °After discharge, you should have regular check-up appointments with your healthcare provider that is prescribing your Xarelto®.   ° °What do you do if you miss a dose? °If you miss a dose, take it as soon as you remember on the same day then continue your regularly scheduled once daily regimen the next day. Do not take two doses of Xarelto® on the same day.  ° °Important Safety Information °A possible side effect of Xarelto® is bleeding. You should call your healthcare provider right away if you experience any of the following: °? Bleeding from an injury or your nose that does not stop. °? Unusual  colored urine (red or dark brown) or unusual colored stools (red or black). °? Unusual bruising for unknown reasons. °? A serious fall or if you hit your head (even if there is no bleeding). ° °Some medicines may interact with Xarelto® and might increase your risk of bleeding while on Xarelto®. To help avoid this, consult your healthcare provider or pharmacist prior to using any new prescription or non-prescription medications, including herbals, vitamins, non-steroidal anti-inflammatory drugs (NSAIDs) and supplements. ° °This website has more information on Xarelto®: www.xarelto.com. ° ° °

## 2016-05-24 NOTE — Progress Notes (Signed)
Physical Therapy Treatment Patient Details Name: Alexander Moss MRN: [id number] DOB: September 12, 1945 Today's Date: 05/24/2016    History of Present Illness s/p R TKA    PT Comments    Patient was seen in the recliner upon arrival. Pain was rated a 3/10 before treatment and a 5/10 following treatment. Performed sit to stand x min guard with VC's for safe UE/LE placement. Patient is very impulsive. Gait x 75 ft with VC's for sequencing and proper use of RW. Performed AROM and AAROM exercises in recliner following ambulation. Applied ice following treatment on R knee to decrease swelling and pain.  Notified nursing of pain levels. Will perform stairs this afternoon. Plans to D/C home today.  Follow Up Recommendations        Equipment Recommendations       Recommendations for Other Services       Precautions / Restrictions Precautions Precautions: Knee Required Braces or Orthoses: Knee Immobilizer - Right Knee Immobilizer - Right: Discontinue post op day 2 Restrictions Weight Bearing Restrictions: No Other Position/Activity Restrictions: WBAT    Mobility  Bed Mobility               General bed mobility comments: oob  Transfers Overall transfer level: Independent Equipment used: Rolling walker (2 wheeled) Transfers: Sit to/from Stand Sit to Stand: Min guard         General transfer comment: VC's for safety awareness; impulsive.  Ambulation/Gait Ambulation/Gait assistance: Min guard Ambulation Distance (Feet): 75 Feet Assistive device: Rolling walker (2 wheeled) Gait Pattern/deviations: Step-to pattern;Decreased stance time - right;Decreased stride length;Trunk flexed Gait velocity: decreased Gait velocity interpretation: Below normal speed for age/gender General Gait Details: VC's required for sequencing and proper use of the RW.    Stairs            Wheelchair Mobility    Modified Rankin (Stroke Patients Only)       Balance                                     Cognition Arousal/Alertness: Awake/alert Behavior During Therapy: WFL for tasks assessed/performed Overall Cognitive Status: Within Functional Limits for tasks assessed                      Exercises Total Joint Exercises Ankle Circles/Pumps: AROM;Both;10 reps Quad Sets: 10 reps;Both;AROM Towel Squeeze: AROM;10 reps;Both;Supine Short Arc Quad: Supine;AROM;10 reps;Right Heel Slides: AAROM;Supine;10 reps;Right Hip ABduction/ADduction: Supine;10 reps;AROM;Right Straight Leg Raises: AAROM;10 reps;Supine;Right    General Comments        Pertinent Vitals/Pain Pain Assessment: 0-10 Pain Score: 3  Pain Descriptors / Indicators: Discomfort Pain Intervention(s): Limited activity within patient's tolerance;Monitored during session;Repositioned;Premedicated before session;Ice applied    Home Living Family/patient expects to be discharged to:: Private residence Living Arrangements: Spouse/significant other Available Help at Discharge: Family;Available 24 hours/day         Home Equipment: Bedside commode Additional Comments: plans to borrow RW    Prior Function Level of Independence: Independent          PT Goals (current goals can now be found in the care plan section) Acute Rehab PT Goals Patient Stated Goal: back to IND Progress towards PT goals: Progressing toward goals    Frequency           PT Plan      Co-evaluation  End of Session Equipment Utilized During Treatment: Gait belt;Right knee immobilizer Activity Tolerance: Patient tolerated treatment well Patient left: in chair;with call bell/phone within reach;with family/visitor present     Time:  - 9:07 - 9:35    Charges:   1 gt  1 te                    G CodesHall Busing, SPTA [facility] Acute Rehab [phone]  Present and agree with above  Rica Koyanagi  PTA [facility]  Acute  Rehab Pager      [phone]

## 2016-05-24 NOTE — Progress Notes (Signed)
   Subjective: 1 Day Post-Op Procedure(s) (LRB): RIGHT TOTAL KNEE ARTHROPLASTY (Right) Patient reports pain as mild.   Patient seen in rounds for Dr. Wynelle Link.  Walked 55 feet day of surgery. Patient is well, but has had some minor complaints of pain in the knee, requiring pain medications We will start therapy today.  If they do well with therapy and meets all goals, then will allow home later this afternoon following therapy. Plan is to go Home after hospital stay.  Objective: Vital signs in last 24 hours: Temp:  [95.7 F (35.4 C)-98.1 F (36.7 C)] 97.3 F (36.3 C) (11/14 0541) Pulse Rate:  [56-85] 77 (11/14 0541) Resp:  [10-19] 16 (11/14 0541) BP: (104-134)/(45-82) 124/74 (11/14 0541) SpO2:  [93 %-100 %] 94 % (11/14 0541)  Intake/Output from previous day:  Intake/Output Summary (Last 24 hours) at 05/24/16 0841 Last data filed at 05/24/16 0600  Gross per 24 hour  Intake          3826.67 ml  Output             4250 ml  Net          -423.33 ml    Intake/Output this shift: No intake/output data recorded.  Labs:  Recent Labs  05/24/16 0353  HGB 12.9*    Recent Labs  05/24/16 0353  WBC 14.7*  RBC 3.83*  HCT 35.5*  PLT 225    Recent Labs  05/24/16 0353  NA 135  K 4.3  CL 104  CO2 25  BUN 16  CREATININE 0.99  GLUCOSE 152*  CALCIUM 8.5*   No results for input(s): LABPT, INR in the last 72 hours.  EXAM General - Patient is Alert, Appropriate and Oriented Extremity - Neurovascular intact Sensation intact distally Intact pulses distally Dorsiflexion/Plantar flexion intact Dressing - dressing C/D/I Motor Function - intact, moving foot and toes well on exam.  Hemovac pulled without difficulty.  Past Medical History:  Diagnosis Date  . Arthritis   . GERD (gastroesophageal reflux disease)     Assessment/Plan: 1 Day Post-Op Procedure(s) (LRB): RIGHT TOTAL KNEE ARTHROPLASTY (Right) Principal Problem:   OA (osteoarthritis) of knee  Estimated body  mass index is 36.18 kg/m as calculated from the following:   Height as of this encounter: 5\' 7"  (1.702 m).   Weight as of this encounter: 104.8 kg (231 lb). Up with therapy Discharge home with home health  DVT Prophylaxis - Xarelto Weight-Bearing as tolerated to right leg D/C O2 and Pulse OX and try on Room Air  If meets goals and able to go home: Up with therapy Discharge home with home health Diet - Regular diet Follow up - in 2 weeks Activity - WBAT Disposition - Home Condition Upon Discharge - Good D/C Meds - See DC Summary DVT Prophylaxis - [place], PA-C Orthopaedic Surgery

## 2016-05-24 NOTE — Evaluation (Signed)
Occupational Therapy Evaluation Patient Details Name: Alexander Moss MRN: [id number] DOB: September 03, 1945 Today's Date: 05/24/2016    History of Present Illness s/p R TKA   Clinical Impression   This 70 year old man was admitted for the above sx. All education was completed. No further OT is needed at this time    Follow Up Recommendations  No OT follow up    Equipment Recommendations  None recommended by OT    Recommendations for Other Services       Precautions / Restrictions Precautions Precautions: Knee Required Braces or Orthoses: Knee Immobilizer - Right Knee Immobilizer - Right: Discontinue once straight leg raise with < 10 degree lag Restrictions Weight Bearing Restrictions: No Other Position/Activity Restrictions: WBAT      Mobility Bed Mobility               General bed mobility comments: oob  Transfers   Equipment used: Rolling walker (2 wheeled) Transfers: Sit to/from Stand Sit to Stand: Min guard         General transfer comment: for safety:  quick transition which isn't smooth    Balance                                            ADL Overall ADL's : Needs assistance/impaired     Grooming: Wash/dry Radiographer, therapeutic: Min guard;Ambulation;Comfort height toilet;RW   Toileting- Clothing Manipulation and Hygiene: Min guard   Tub/ Shower Transfer: Walk-in shower;Min guard;Ambulation     General ADL Comments: pt states his wife will assist with adls.  Educated both on use of 3:1 in shower as shower seat and recommended wife wipe legs dry after use.  Pt tends to move quickly.  Educated on safety, sidestepping through tight spaces as needed     Vision     Perception     Praxis      Pertinent Vitals/Pain Pain Assessment: No/denies pain     Hand Dominance     Extremity/Trunk Assessment Upper Extremity Assessment Upper Extremity Assessment: Overall WFL  for tasks assessed           Communication Communication Communication: No difficulties   Cognition Arousal/Alertness: Awake/alert Behavior During Therapy: WFL for tasks assessed/performed Overall Cognitive Status: Within Functional Limits for tasks assessed                     General Comments       Exercises       Shoulder Instructions      Home Living Family/patient expects to be discharged to:: Private residence Living Arrangements: Spouse/significant other Available Help at Discharge: Family;Available 24 hours/day               Bathroom Shower/Tub: Occupational psychologist: Standard     Home Equipment: Bedside commode   Additional Comments: plans to borrow RW      Prior Functioning/Environment Level of Independence: Independent                 OT Problem List:     OT Treatment/Interventions:      OT Goals(Current goals can be found in the care plan section) Acute Rehab OT Goals Patient Stated Goal: back to IND OT Goal Formulation: All assessment and  education complete, DC therapy  OT Frequency:     Barriers to D/C:            Co-evaluation              End of Session CPM Right Knee CPM Right Knee: Off  Activity Tolerance: Patient tolerated treatment well Patient left: in chair;with call bell/phone within reach;with family/visitor present   Time: [id number] OT Time Calculation (min): 18 min Charges:  OT General Charges $OT Visit: 1 Procedure OT Evaluation $OT Eval Low Complexity: 1 Procedure G-Codes:    Libbey Duce 06-06-16, 12:15 PM  Lesle Chris, OTR/L [phone] Jun 06, 2016

## 2016-05-24 NOTE — Progress Notes (Signed)
Pt to d/c home. Has outpatient PT set up. Prescriptions given. Needs at [facility] and is waiting for [facility] to deliver it to his room prior to d/c. AVS reviewed and "My Chart" discussed with pt. Pt capable of verbalizing medications, dressing changes, signs and symptoms of infection, and follow-up appointments. Remains hemodynamically stable. No signs and symptoms of distress. Educated pt to return to ER in the case of SOB, dizziness, or chest pain.

## 2016-05-24 NOTE — Progress Notes (Signed)
Physical Therapy Treatment Patient Details Name: LEONEL WAKLEY MRN: [id number] DOB: 1945-07-17 Today's Date: 05/24/2016    History of Present Illness s/p R TKA    PT Comments    POD # 1 pm session Assisted with stair training with pt and spouse.  Issued one crutch for inside stairs.  Handout also given on proper stair negotiation.   Pt ready for D/C to home.   Follow Up Recommendations  Home health PT;Outpatient PT     Equipment Recommendations  Rolling walker with 5" wheels;Crutches    Recommendations for Other Services       Precautions / Restrictions Precautions Precautions: Knee Precaution Comments: instructed on KI use esp for stairs Required Braces or Orthoses: Knee Immobilizer - Right Knee Immobilizer - Right: Discontinue post op day 2 Restrictions Weight Bearing Restrictions: No Other Position/Activity Restrictions: WBAT    Mobility  Bed Mobility               General bed mobility comments: NT OOB  Transfers Overall transfer level: Modified independent Equipment used: Rolling walker (2 wheeled) Transfers: Sit to/from [facility] Sit to Stand: Supervision Stand pivot transfers: Supervision       General transfer comment: 25% VC's on safety with turns  Ambulation/Gait Ambulation/Gait assistance: Supervision;Min guard Ambulation Distance (Feet): 45 Feet Assistive device: Rolling walker (2 wheeled) Gait Pattern/deviations: Step-to pattern Gait velocity: decreased Gait velocity interpretation: Below normal speed for age/gender General Gait Details: VC's required for sequencing and proper use of the RW.    Stairs Stairs: Yes Stairs assistance: Min assist     General stair comments: 2 different situations.  up 2 steps backward no rails using walker.  Spouse present for instruction.  second situation up forward using one rail and one crutch.  handouts also given.   Wheelchair Mobility    Modified Rankin (Stroke Patients  Only)       Balance                                    Cognition Arousal/Alertness: Awake/alert Behavior During Therapy: WFL for tasks assessed/performed Overall Cognitive Status: Within Functional Limits for tasks assessed                      Exercises Total Joint Exercises Ankle Circles/Pumps: AROM;Both;10 reps Quad Sets: 10 reps;Both;AROM Towel Squeeze: AROM;10 reps;Both;Supine Short Arc Quad: Supine;AROM;10 reps;Right Heel Slides: AAROM;Supine;10 reps;Right Hip ABduction/ADduction: Supine;10 reps;AROM;Right Straight Leg Raises: AAROM;10 reps;Supine;Right    General Comments        Pertinent Vitals/Pain Pain Assessment: 0-10 Pain Score: 5  Pain Location: R knee Pain Descriptors / Indicators: Discomfort;Sore Pain Intervention(s): Monitored during session;Repositioned;Ice applied    Home Living Family/patient expects to be discharged to:: Private residence Living Arrangements: Spouse/significant other Available Help at Discharge: Family;Available 24 hours/day         Home Equipment: Bedside commode Additional Comments: plans to borrow RW    Prior Function Level of Independence: Independent          PT Goals (current goals can now be found in the care plan section) Acute Rehab PT Goals Patient Stated Goal: back to IND Progress towards PT goals: Progressing toward goals    Frequency    7X/week      PT Plan Current plan remains appropriate    Co-evaluation             End  of Session Equipment Utilized During Treatment: Gait belt;Right knee immobilizer Activity Tolerance: Patient tolerated treatment well Patient left: in chair;with call bell/phone within reach;with family/visitor present     Time: 1355-1420 PT Time Calculation (min) (ACUTE ONLY): 25 min  Charges:  $Gait Training: 8-22 mins $Therapeutic Activity: 8-22 mins                    G Codes:      Rica Koyanagi  PTA [facility]  Acute  Rehab Pager       [phone]

## 2016-05-24 NOTE — Care Management Note (Signed)
Case Management Note  Patient Details  Name: BAKARY BRAMER MRN: [id number] Date of Birth: September 12, 1945  Subjective/Objective:                  RIGHT TOTAL KNEE ARTHROPLASTY (Right) Action/Plan:  Expected Discharge Date:  05/25/16               Expected Discharge Plan:  Home/Self Care  In-House Referral:     Discharge planning Services  CM Consult  Post Acute Care Choice:    Choice offered to:  Patient  DME Arranged:  [facility] rolling DME Agency:  [place]:  NA [place] Agency:  NA  Status of Service:  Completed, signed off  If discussed at [place] of Stay Meetings, dates discussed:    Additional Comments: CM met with pt to confirm plan is for outpt PT; pt confirms.  CM notified [place] DME rep to please deliver the rolling walker to room prior to discharge. No other CM needs were communicated. Dellie Catholic, RN 05/24/2016, 12:35 PM

## 2016-05-24 NOTE — Discharge Summary (Signed)
Physician Discharge Summary   Patient ID: Alexander Moss MRN: [id number] DOB/AGE: 1946-04-10 70 y.o.  Admit date: 05/23/2016 Discharge date: 05-24-2016  Primary Diagnosis:  Osteoarthritis  Right knee(s)  Admission Diagnoses:  Past Medical History:  Diagnosis Date  . Arthritis   . GERD (gastroesophageal reflux disease)    Discharge Diagnoses:   Principal Problem:   OA (osteoarthritis) of knee  Estimated body mass index is 36.18 kg/m as calculated from the following:   Height as of this encounter: '5\' 7"'$  (1.702 m).   Weight as of this encounter: 104.8 kg (231 lb).  Procedure:  Procedure(s) (LRB): RIGHT TOTAL KNEE ARTHROPLASTY (Right)   Consults: None  HPI: Alexander Moss is a 70 y.o. year old male with end stage OA of his right knee with progressively worsening pain and dysfunction. He has constant pain, with activity and at rest and significant functional deficits with difficulties even with ADLs. He has had extensive non-op management including analgesics, injections of cortisone and viscosupplements, and home exercise program, but remains in significant pain with significant dysfunction. Radiographs show bone on bone arthritis medial and patellofemoral. He presents now for right Total Knee Arthroplasty.   Laboratory Data: Admission on 05/23/2016  Component Date Value Ref Range Status  . WBC 05/24/2016 14.7* 4.0 - 10.5 K/uL Final  . RBC 05/24/2016 3.83* 4.22 - 5.81 MIL/uL Final  . Hemoglobin 05/24/2016 12.9* 13.0 - 17.0 g/dL Final  . HCT 05/24/2016 35.5* 39.0 - 52.0 % Final  . MCV 05/24/2016 92.7  78.0 - 100.0 fL Final  . MCH 05/24/2016 33.7  26.0 - 34.0 pg Final  . MCHC 05/24/2016 36.3* 30.0 - 36.0 g/dL Final  . RDW 05/24/2016 12.9  11.5 - 15.5 % Final  . Platelets 05/24/2016 225  150 - 400 K/uL Final  . Sodium 05/24/2016 135  135 - 145 mmol/L Final  . Potassium 05/24/2016 4.3  3.5 - 5.1 mmol/L Final  . Chloride 05/24/2016 104  101 - 111 mmol/L Final  . CO2  05/24/2016 25  22 - 32 mmol/L Final  . Glucose, Bld 05/24/2016 152* 65 - 99 mg/dL Final  . BUN 05/24/2016 16  6 - 20 mg/dL Final  . Creatinine, Ser 05/24/2016 0.99  0.61 - 1.24 mg/dL Final  . Calcium 05/24/2016 8.5* 8.9 - 10.3 mg/dL Final  . GFR calc non Af Amer 05/24/2016 >60  >60 mL/min Final  . GFR calc Af Amer 05/24/2016 >60  >60 mL/min Final   Comment: (NOTE) The eGFR has been calculated using the CKD EPI equation. This calculation has not been validated in all clinical situations. eGFR's persistently <60 mL/min signify possible Chronic Kidney Disease.   Georgiann Hahn gap 05/24/2016 6  5 - 15 Final  Hospital Outpatient Visit on 05/13/2016  Component Date Value Ref Range Status  . aPTT 05/13/2016 32  24 - 36 seconds Final  . WBC 05/13/2016 6.6  4.0 - 10.5 K/uL Final  . RBC 05/13/2016 4.77  4.22 - 5.81 MIL/uL Final  . Hemoglobin 05/13/2016 15.7  13.0 - 17.0 g/dL Final  . HCT 05/13/2016 45.4  39.0 - 52.0 % Final  . MCV 05/13/2016 95.2  78.0 - 100.0 fL Final  . MCH 05/13/2016 32.9  26.0 - 34.0 pg Final  . MCHC 05/13/2016 34.6  30.0 - 36.0 g/dL Final  . RDW 05/13/2016 13.1  11.5 - 15.5 % Final  . Platelets 05/13/2016 266  150 - 400 K/uL Final  . Sodium 05/13/2016 136  135 - 145  mmol/L Final  . Potassium 05/13/2016 5.0  3.5 - 5.1 mmol/L Final  . Chloride 05/13/2016 103  101 - 111 mmol/L Final  . CO2 05/13/2016 27  22 - 32 mmol/L Final  . Glucose, Bld 05/13/2016 90  65 - 99 mg/dL Final  . BUN 05/13/2016 24* 6 - 20 mg/dL Final  . Creatinine, Ser 05/13/2016 0.95  0.61 - 1.24 mg/dL Final  . Calcium 05/13/2016 9.5  8.9 - 10.3 mg/dL Final  . Total Protein 05/13/2016 7.9  6.5 - 8.1 g/dL Final  . Albumin 05/13/2016 4.1  3.5 - 5.0 g/dL Final  . AST 05/13/2016 41  15 - 41 U/L Final  . ALT 05/13/2016 31  17 - 63 U/L Final  . Alkaline Phosphatase 05/13/2016 56  38 - 126 U/L Final  . Total Bilirubin 05/13/2016 0.9  0.3 - 1.2 mg/dL Final  . GFR calc non Af Amer 05/13/2016 >60  >60 mL/min Final  .  GFR calc Af Amer 05/13/2016 >60  >60 mL/min Final   Comment: (NOTE) The eGFR has been calculated using the CKD EPI equation. This calculation has not been validated in all clinical situations. eGFR's persistently <60 mL/min signify possible Chronic Kidney Disease.   . Anion gap 05/13/2016 6  5 - 15 Final  . Prothrombin Time 05/13/2016 13.2  11.4 - 15.2 seconds Final  . INR 05/13/2016 1.00   Final  . ABO/RH(D) 05/23/2016 O POS   Final  . Antibody Screen 05/23/2016 NEG   Final  . Sample Expiration 05/23/2016 05/26/2016   Final  . Extend sample reason 05/23/2016 NO TRANSFUSIONS OR PREGNANCY IN THE PAST 3 MONTHS   Final  . Color, Urine 05/13/2016 YELLOW  YELLOW Final  . APPearance 05/13/2016 CLOUDY* CLEAR Final  . Specific Gravity, Urine 05/13/2016 1.018  1.005 - 1.030 Final  . pH 05/13/2016 7.5  5.0 - 8.0 Final  . Glucose, UA 05/13/2016 NEGATIVE  NEGATIVE mg/dL Final  . Hgb urine dipstick 05/13/2016 NEGATIVE  NEGATIVE Final  . Bilirubin Urine 05/13/2016 NEGATIVE  NEGATIVE Final  . Ketones, ur 05/13/2016 NEGATIVE  NEGATIVE mg/dL Final  . Protein, ur 05/13/2016 NEGATIVE  NEGATIVE mg/dL Final  . Nitrite 05/13/2016 NEGATIVE  NEGATIVE Final  . Leukocytes, UA 05/13/2016 NEGATIVE  NEGATIVE Final  . MRSA, PCR 05/13/2016 NEGATIVE  NEGATIVE Final  . Staphylococcus aureus 05/13/2016 POSITIVE* NEGATIVE Final   Comment:        The Xpert SA Assay (FDA approved for NASAL specimens in patients over 8 years of age), is one component of a comprehensive surveillance program.  Test performance has been validated by [facility] for patients greater than or equal to 80 year old. It is not intended to diagnose infection nor to guide or monitor treatment.   . ABO/RH(D) 05/13/2016 O POS   Final     X-Rays:No results found.  EKG: Orders placed or performed in visit on 05/12/16  . EKG 12-Lead     Hospital Course: Alexander Moss is a 70 y.o. who was admitted to [facility]. They were  brought to the operating room on 05/23/2016 and underwent Procedure(s): RIGHT TOTAL KNEE ARTHROPLASTY.  Patient tolerated the procedure well and was later transferred to the recovery room and then to the orthopaedic floor for postoperative care.  They were given PO and IV analgesics for pain control following their surgery.  They were given 24 hours of postoperative antibiotics of  Anti-infectives    Start     Dose/Rate Route Frequency  Ordered Stop   05/23/16 1400  ceFAZolin (ANCEF) IVPB 2g/100 mL premix     2 g 200 mL/hr over 30 Minutes Intravenous Every 6 hours 05/23/16 1139 05/23/16 2103   05/23/16 0621  ceFAZolin (ANCEF) IVPB 2g/100 mL premix     2 g 200 mL/hr over 30 Minutes Intravenous On call to O.R. 05/23/16 0539 05/23/16 0824     and started on DVT prophylaxis in the form of Xarelto.   PT and OT were ordered for total joint protocol.  Discharge planning consulted to help with postop disposition and equipment needs.  Patient had a good night on the evening of surgery and walked 55 feet day of surgery.  They started to get up OOB with therapy on day one. Hemovac drain was pulled without difficulty.  Dressing was checked and  was clean and dry.  Patient was seen in rounds and was ready to go home on day one.  Discharge home with home health Diet - Regular diet Follow up - in 2 weeks Activity - WBAT Disposition - Home Condition Upon Discharge - Good D/C Meds - See DC Summary DVT Prophylaxis - Xarelto   Discharge Instructions    Call MD / Call 911    Complete by:  As directed    If you experience chest pain or shortness of breath, CALL 911 and be transported to the hospital emergency room.  If you develope a fever above 101 F, pus (white drainage) or increased drainage or redness at the wound, or calf pain, call your surgeon's office.   Change dressing    Complete by:  As directed    Change dressing daily with sterile 4 x 4 inch gauze dressing and apply TED hose. Do not submerge the  incision under water.   Constipation Prevention    Complete by:  As directed    Drink plenty of fluids.  Prune juice may be helpful.  You may use a stool softener, such as Colace (over the counter) 100 mg twice a day.  Use MiraLax (over the counter) for constipation as needed.   Diet general    Complete by:  As directed    Discharge instructions    Complete by:  As directed    Pick up stool softner and laxative for home use following surgery while on pain medications. Do not submerge incision under water. Please use good hand washing techniques while changing dressing each day. May shower starting three days after surgery. Please use a clean towel to pat the incision dry following showers. Continue to use ice for pain and swelling after surgery. Do not use any lotions or creams on the incision until instructed by your surgeon.   Postoperative Constipation Protocol  Constipation - defined medically as fewer than three stools per week and severe constipation as less than one stool per week.  One of the most common issues patients have following surgery is constipation.  Even if you have a regular bowel pattern at home, your normal regimen is likely to be disrupted due to multiple reasons following surgery.  Combination of anesthesia, postoperative narcotics, change in appetite and fluid intake all can affect your bowels.  In order to avoid complications following surgery, here are some recommendations in order to help you during your recovery period.  Colace (docusate) - Pick up an over-the-counter form of Colace or another stool softener and take twice a day as long as you are requiring postoperative pain medications.  Take with a full glass  of water daily.  If you experience loose stools or diarrhea, hold the colace until you stool forms back up.  If your symptoms do not get better within 1 week or if they get worse, check with your doctor.  Dulcolax (bisacodyl) - Pick up over-the-counter  and take as directed by the product packaging as needed to assist with the movement of your bowels.  Take with a full glass of water.  Use this product as needed if not relieved by Colace only.   MiraLax (polyethylene glycol) - Pick up over-the-counter to have on hand.  MiraLax is a solution that will increase the amount of water in your bowels to assist with bowel movements.  Take as directed and can mix with a glass of water, juice, soda, coffee, or tea.  Take if you go more than two days without a movement. Do not use MiraLax more than once per day. Call your doctor if you are still constipated or irregular after using this medication for 7 days in a row.  If you continue to have problems with postoperative constipation, please contact the office for further assistance and recommendations.  If you experience "the worst abdominal pain ever" or develop nausea or vomiting, please contact the office immediatly for further recommendations for treatment.   Take Xarelto for two and a half more weeks, then discontinue Xarelto. Once the patient has completed the Xarelto, they may resume the 81 mg Aspirin.   Do not put a pillow under the knee. Place it under the heel.    Complete by:  As directed    Do not sit on low chairs, stoools or toilet seats, as it may be difficult to get up from low surfaces    Complete by:  As directed    Driving restrictions    Complete by:  As directed    No driving until released by the physician.   Increase activity slowly as tolerated    Complete by:  As directed    Lifting restrictions    Complete by:  As directed    No lifting until released by the physician.   Patient may shower    Complete by:  As directed    You may shower without a dressing once there is no drainage.  Do not wash over the wound.  If drainage remains, do not shower until drainage stops.   TED hose    Complete by:  As directed    Use stockings (TED hose) for 3 weeks on both leg(s).  You may  remove them at night for sleeping.   Weight bearing as tolerated    Complete by:  As directed    Laterality:  right   Extremity:  Lower       Medication List    STOP taking these medications   aspirin 81 MG tablet   CALCIUM 1000 + D PO   Fish Oil 1000 MG Caps   naproxen sodium 220 MG tablet Commonly known as:  ANAPROX   VITAMIN B-12 PO   vitamin C 500 MG tablet Commonly known as:  ASCORBIC ACID   Vitamin D (Cholecalciferol) 1000 units Caps   vitamin E 400 UNIT capsule   Zinc 50 MG Caps   ZINC PO     TAKE these medications   fexofenadine 180 MG tablet Commonly known as:  ALLEGRA Take 180 mg by mouth daily.   methocarbamol 500 MG tablet Commonly known as:  ROBAXIN Take 1 tablet (500 mg total) by  mouth every 6 (six) hours as needed for muscle spasms.   oxyCODONE 5 MG immediate release tablet Commonly known as:  Oxy IR/ROXICODONE Take 1-2 tablets (5-10 mg total) by mouth every 3 (three) hours as needed for moderate pain or severe pain.   rivaroxaban 10 MG Tabs tablet Commonly known as:  XARELTO Take 1 tablet (10 mg total) by mouth daily with breakfast. Take Xarelto for two and a half more weeks, then discontinue Xarelto. Once the patient has completed the Xarelto, they may resume the 81 mg Aspirin. Start taking on:  05/25/2016   traMADol 50 MG tablet Commonly known as:  ULTRAM Take 1-2 tablets (50-100 mg total) by mouth every 6 (six) hours as needed (mild pain).      Follow-up Information    Gearlean Alf, MD. Schedule an appointment as soon as possible for a visit on 06/07/2016.   Specialty:  Orthopedic Surgery Contact information: [address] [place] [address] [phone]           Signed: Arlee Muslim, PA-C Orthopaedic Surgery 05/24/2016, 8:46 AM

## 2016-05-26 ENCOUNTER — Ambulatory Visit: Payer: Medicare Other | Attending: Orthopedic Surgery | Admitting: Physical Therapy

## 2016-05-26 ENCOUNTER — Encounter: Payer: Self-pay | Admitting: Physical Therapy

## 2016-05-26 VITALS — BP 145/88 | HR 60

## 2016-05-26 DIAGNOSIS — M6281 Muscle weakness (generalized): Secondary | ICD-10-CM | POA: Diagnosis not present

## 2016-05-26 DIAGNOSIS — M25561 Pain in right knee: Secondary | ICD-10-CM | POA: Diagnosis not present

## 2016-05-26 DIAGNOSIS — R262 Difficulty in walking, not elsewhere classified: Secondary | ICD-10-CM

## 2016-05-26 NOTE — Therapy (Signed)
[place] MAIN [facility] SERVICES [address] [place], [place], [address] Phone: [phone]   Fax:  [phone]  Physical Therapy Evaluation  Patient Details  Name: Alexander Moss MRN: [id number] Date of Birth: 12-Jan-1946 Referring Provider: Dione Plover. Aluisio  Encounter Date: 05/26/2016      PT End of Session - 05/26/16 1042    Visit Number 1   Number of Visits 17   Date for PT Re-Evaluation 2016/08/16   Authorization Type g codes   Authorization Time Period 1/10   PT Start Time 0934   PT Stop Time 1040   PT Time Calculation (min) 66 min   Equipment Utilized During Treatment Gait belt   Activity Tolerance Patient tolerated treatment well;Patient limited by pain   Behavior During Therapy WFL for tasks assessed/performed      Past Medical History:  Diagnosis Date  . Arthritis   . GERD (gastroesophageal reflux disease)     Past Surgical History:  Procedure Laterality Date  . [place]  . KNEE SURGERY Bilateral 2003  . TONSILLECTOMY AND ADENOIDECTOMY    . TOTAL KNEE ARTHROPLASTY Right 05/23/2016   Procedure: RIGHT TOTAL KNEE ARTHROPLASTY;  Surgeon: Gaynelle Arabian, MD;  Location: [facility];  Service: Orthopedics;  Laterality: Right;    Vitals:   05/26/16 0940  BP: (!) 145/88  Pulse: 60         Subjective Assessment - 05/26/16 1045    Subjective s/p R TKA   Pertinent History Pt is s/p R TKA 05/23/16.  He has orders for WBAT.  Pt reports he has not had any issues ascending/descending steps at home using crutch and a rail, has performed x2 since d/c from the hospital.  He has been ambulating with his RW since d/c and denies any falls.  Pt is retired and enjoys working with [employer] for [employer].  Pt has h/o chronic low back pain.  [name] he injured his back when he was 70 years old when he was moving concrete and has had pain ever since.  Has tried multiple different interventions including going to a pain clinic. Pt has been  sleeping on his L side while wearing his KI.  Has only been wearing KI when sleeping.  Pt has had most difficulty with bending over and donning pants and undergarments as well as bathing and has been having assist from wife for this. Pt was not using AD prior to R TKA and denies any falls in the past 6 months.    Limitations Standing;Walking;House hold activities   How long can you stand comfortably? 5 minutes   How long can you walk comfortably? 100 ft   Patient Stated Goals To be able to climb a ladder to a roof so he can continue volunteering with [employer]   Currently in Pain? Yes   Pain Score 3    Pain Location Knee   Pain Orientation Right   Pain Descriptors / Indicators Aching;Sharp   Pain Type Surgical pain   Pain Onset In the past 7 days   Pain Frequency Constant   Aggravating Factors  R knee AROM, walking, sit<>stand   Pain Relieving Factors Ice, rest   Effect of Pain on Daily Activities Requiring assist from wife for bathing, dressing   Multiple Pain Sites No            [facility] PT Assessment - 05/26/16 0946      Assessment   Medical Diagnosis R TKA 05/23/16  Referring Provider Dione Plover. Aluisio   Onset Date/Surgical Date 05/23/16   Hand Dominance Right   Next MD Visit With [facility] on 06/09/16   Prior Therapy No     Precautions   Precautions Fall;Knee   Precaution Booklet Issued No   Precaution Comments Reviewed no pillow under knee and to maintain R knee extension when sitting or supine     Restrictions   Weight Bearing Restrictions Yes   RLE Weight Bearing Weight bearing as tolerated     Balance Screen   Has the patient fallen in the past 6 months No   Has the patient had a decrease in activity level because of a fear of falling?  No   Is the patient reluctant to leave their home because of a fear of falling?  No     Home Environment   Living Environment Private residence   Living Arrangements Spouse/significant other   Available  Help at Discharge Family;Available 24 hours/day   Type of Home House   Home Access Stairs to enter   Entrance Stairs-Number of Steps 3   Entrance Stairs-Rails None   Home Layout Two level  bedroom in basement downstairs   Alternate Level Stairs-Number of Steps flight   Alternate Level Stairs-Rails Left   Home Equipment Walker - 2 wheels;Bedside commode;Crutches     Prior Function   Level of Independence Needs assistance with ADLs;Needs assistance with gait  supervision for gait   Vocation Retired   Leisure Volunteering with [employer]   Overall Cognitive Status Within Functional Limits for tasks assessed     Observation/Other Assessments   Observations Post op R TKA with bandages over surgical site     Coordination   Gross Motor Movements are Fluid and Coordinated --     Posture/Postural Control   Posture/Postural Control Postural limitations   Postural Limitations Rounded Shoulders;Increased thoracic kyphosis;Flexed trunk;Weight shift left     ROM / Strength   AROM / PROM / Strength Strength     Strength   Strength Assessment Site Hip;Knee;Ankle   Right/Left Hip Right;Left   Right Hip Flexion 3-/5  pain R knee   Right Hip ABduction 4/5   Right Hip ADduction 4/5   Left Hip Flexion 5/5   Left Hip ABduction 5/5   Left Hip ADduction 5/5   Right/Left Knee Right;Left   Right Knee Flexion 4/5   Right Knee Extension 3-/5   Left Knee Flexion 5/5   Left Knee Extension 5/5   Right/Left Ankle Right;Left   Right Ankle Dorsiflexion 5/5   Right Ankle Plantar Flexion 5/5   Right Ankle Inversion 5/5   Right Ankle Eversion 5/5   Left Ankle Dorsiflexion 5/5   Left Ankle Plantar Flexion 5/5   Left Ankle Inversion 5/5   Left Ankle Eversion 5/5     Bed Mobility   Bed Mobility Supine to Sit;Sit to Supine   Supine to Sit 4: Min assist   Supine to Sit Details (indicate cue type and reason) Min assist to advance RLE off bed and to floor. Pt with increased  effort and time.   Sit to Supine 4: Min assist   Sit to Supine - Details (indicate cue type and reason) Min assist to advance RLE from floor into bed.  Pt with increased effort and time.     Transfers   Transfers Sit to Stand;Stand to Sit   Sit to Stand 4: Min guard;From chair/3-in-1;With armrests  Five time sit to stand comments  26.81 seconds   Stand to Sit 4: Min guard;With armrests;To chair/3-in-1;Uncontrolled descent   Transfer Cueing Cues for proper hand placement when standing from chair or treatment table as pt initially standing with both hands on RW     Ambulation/Gait   Ambulation/Gait Yes   Ambulation/Gait Assistance 4: Min guard   Ambulation Distance (Feet) 200 Feet  100 ft x2 with one seated rest break   Assistive device Rolling walker   Gait Pattern Step-to pattern;Decreased stride length;Decreased stance time - right;Decreased step length - left;Decreased weight shift to right;Shuffle;Antalgic;Trunk flexed;Poor foot clearance - left   Ambulation Surface Level   Gait velocity decreased   Stairs No   Gait Comments Heavy WBing through BUE on RW with flexed posture, pt shuffling L foot with no foot clearance.        EXAMINATION:   5x Sit to Stand: 26.81 seconds   Knee AROM in degrees (L, R)  Flexion: 125, 90  Extension: -4, -18   Knee PROM in degrees (L,R)  Flexion: 125, 94  Extension: -2, -15   Hip AROM in degrees (L, R)  Flexion: 72, 70 (AAROM RLE)  Abd: 61, 56 (AAROM RLE)  Add: 56, 42 (AAROM RLE)   Ankle AROM in degrees (L, R)  DF: 11, 8  PF: 50, 39   Sensation: WNL BLE  Edema: Moderate swelling R thigh down to R foot as expected s/p R TKA. Pt instructed to continue icing 3x/day and to ice his R knee when he gets home from this PT session.    TREATMENT:  Therapeutic Exercise:  Supine R knee presses into extension 2x10 with 5 second holds and towel roll under R ankle (added to HEP)  Supine AAROM R heel slides x15  Seated R knee AAROM with towel  under R foot x5 with 5 second holds (added to HEP)  Seated R knee extension x10, pt lacking 15 deg extension (added to HEP)  Gait Training:  Pt ambulated 200 ft with one seated rest break. Cues to dec WBing through BUE and increase use of BLEs. Cues for L foot clearance as pt shuffling L foot, improves following verbal cues to decrease shuffling with pt demonstrating proper foot clearance.               PT Education - 05/26/16 0946    Education provided Yes   Education Details HEP provided and reviewed; Exercise technique; role of PT   Person(s) Educated Patient   Methods Explanation;Demonstration   Comprehension Verbalized understanding;Need further instruction             PT Long Term Goals - 05/26/16 1225      PT LONG TERM GOAL #1   Title Pt will improve R knee flexion AROM by 30 degrees for improved mobility   Baseline 90 deg   Time 2   Period Weeks   Status New     PT LONG TERM GOAL #2   Title Pt will improve R knee extension strength to at least 4/5 to demonstrate improved strength   Baseline 3-/5   Time 6   Period Weeks   Status New     PT LONG TERM GOAL #3   Title Pt will improve 5xSTS to at least 14.2 seconds to demonstrate improved strength and balance   Baseline 26.81 seconds   Time 8   Period Weeks   Status New     PT LONG TERM GOAL #4  Title Pt will be able to ascend/descend a flight of steps without railings to work toward goal of ascending/descending ladder   Time 6   Period Weeks   Status New               Plan - June 09, 2016 1111    Clinical Impression Statement Pt presents with limited ROM and strength as expected s/p R TKA on 05/23/16.  He is able to achieve R knee flexion 90 deg with AROM but demonstrates more limited R knee extension as would be expected with -18 deg AROM.  He presents with strength deficits R knee and hip and is limited by pain. When ambulating he demonstrates decreased weight shift to R, poor L foot clearance,  and heavy WBing reliance on RW.  Pt tolerated all interventions well this date with pain 3/10 R knee. He is very motivated to return to [facility] [employer] and to return to volunteering with [employer].  His ultimate goals is to be able to climb a ladder to the roof.   He will benefit from skilled PT interventions for improved strength, balance, gait mechanics, functional ability, and QOL.   Rehab Potential Good   Clinical Impairments Affecting Rehab Potential (+) Very motivated, (-) h/o chronic low back pain   PT Frequency 2x / week   PT Duration 8 weeks   PT Treatment/Interventions ADLs/Self Care Home Management;Cryotherapy;Electrical Stimulation;Moist Heat;Iontophoresis 4mg /ml Dexamethasone;Ultrasound;Contrast Bath;DME Instruction;Gait training;Stair training;Functional mobility training;Therapeutic activities;Therapeutic exercise;Balance training;Neuromuscular re-education;Patient/family education;Manual techniques;Scar mobilization;Passive range of motion;Compression bandaging;Dry needling;Energy conservation;Taping   PT Next Visit Plan Pt to bring in HEP provided to him at hospital (to review this), 6mWT, gait training, RLE strengthening and ROM program   PT Home Exercise Plan Supine R knee presses, seated R knee AAROM, seated R knee extension   Consulted and Agree with Plan of Care Patient;Family member/caregiver   Family Member Consulted wife      Patient will benefit from skilled therapeutic intervention in order to improve the following deficits and impairments:  Abnormal gait, Decreased activity tolerance, Decreased balance, Decreased endurance, Decreased knowledge of use of DME, Decreased range of motion, Decreased safety awareness, Decreased scar mobility, Decreased strength, Difficulty walking, Increased edema, Increased fascial restricitons, Increased muscle spasms, Impaired perceived functional ability, Impaired flexibility, Improper body mechanics, Postural dysfunction, Pain  Visit  Diagnosis: Acute pain of right knee  Difficulty in walking, not elsewhere classified  Muscle weakness (generalized)      G-Codes - 09-Jun-2016 1229    Functional Assessment Tool Used Clinical Judgement, 5xSTS, MMT, ROM   Functional Limitation Mobility: Walking and moving around   Mobility: Walking and Moving Around Current Status [phone]) At least 40 percent but less than 60 percent impaired, limited or restricted   Mobility: Walking and Moving Around Goal Status [phone]) At least 1 percent but less than 20 percent impaired, limited or restricted       Problem List Patient Active Problem List   Diagnosis Date Noted  . OA (osteoarthritis) of knee 05/23/2016  . Cough 01/13/2015  . Hearing loss 01/06/2015  . Chronic back pain 12/09/2014  . GERD (gastroesophageal reflux disease) 12/09/2014  . Obesity 12/09/2014  . PSA Elevation 12/09/2014  . Rosacea 12/09/2014  . Hyperlipidemia, mixed 07/13/2008  . Family history of GI tract cancer 07/09/2008  . Lumbago 06/25/2007  . Diverticulosis of colon without hemorrhage 09/08/2006     Collie Siad PT, DPT June 09, 2016, 12:31 PM  [place] MAIN [facility] SERVICES [address]  [place], [place], [address] Phone: [phone]   Fax:  [phone]  Name: KHAN BRAYE MRN: [id number] Date of Birth: [birth year]

## 2016-05-28 DIAGNOSIS — K469 Unspecified abdominal hernia without obstruction or gangrene: Secondary | ICD-10-CM | POA: Diagnosis not present

## 2016-05-28 DIAGNOSIS — R300 Dysuria: Secondary | ICD-10-CM | POA: Diagnosis not present

## 2016-05-30 ENCOUNTER — Ambulatory Visit: Payer: Medicare Other | Admitting: Physical Therapy

## 2016-06-01 ENCOUNTER — Ambulatory Visit: Payer: Medicare Other | Admitting: Physical Therapy

## 2016-06-01 DIAGNOSIS — R262 Difficulty in walking, not elsewhere classified: Secondary | ICD-10-CM

## 2016-06-01 DIAGNOSIS — M6281 Muscle weakness (generalized): Secondary | ICD-10-CM

## 2016-06-01 DIAGNOSIS — M25561 Pain in right knee: Secondary | ICD-10-CM | POA: Diagnosis not present

## 2016-06-01 NOTE — Patient Instructions (Signed)
Quad sets with knee extension mobilizations x8 total   Soft tissue mobilization to distal hamstrings as these were reported to initially be tight and painful, noted to be in spasm on palpation.   Gait assessment -- initially noted to have knee flexion throughout gait cycle, decreased toe off... After knee extension mobilizations appeared to have less knee flexion in stance phase   Calf stretching manually via PT with knee extended, noted to lack DF.   Knee flexion mobilizations x 10 total for 5-10 second holds (reported incision pain, but not elsewhere)   Knee flexion in sitting - 87 degrees   Knee extension in supine - lacking 11 degrees from neutral    Long arc quad against min manual resistance x 12 for 2 sets -- well tolerated

## 2016-06-01 NOTE — Therapy (Signed)
[place] [facility] [address] [place], [address] Phone: [phone]   Fax:  [phone]  Physical Therapy Treatment  Patient Details  Name: Alexander Moss MRN: [id number] Date of Birth: 1946/07/08 Referring Provider: Dione Plover. Aluisio  Encounter Date: 06/01/2016      PT End of Session - 06/01/16 1254    Visit Number 2   Number of Visits 17   Date for PT Re-Evaluation August 03, 2016   Authorization Type g codes   Authorization Time Period 1/10   PT Start Time 0920   PT Stop Time 1005   PT Time Calculation (min) 45 min   Activity Tolerance Patient tolerated treatment well   Behavior During Therapy WFL for tasks assessed/performed      Past Medical History:  Diagnosis Date  . Arthritis   . GERD (gastroesophageal reflux disease)     Past Surgical History:  Procedure Laterality Date  . Time  . KNEE SURGERY Bilateral 2003  . TONSILLECTOMY AND ADENOIDECTOMY    . TOTAL KNEE ARTHROPLASTY Right 05/23/2016   Procedure: RIGHT TOTAL KNEE ARTHROPLASTY;  Surgeon: Gaynelle Arabian, MD;  Location: [facility];  Service: Orthopedics;  Laterality: Right;    There were no vitals filed for this visit.      Subjective Assessment - 06/01/16 0923    Subjective Patient reports he has recently been treated for a UTI, he is on the "downside" he has been taking anti-biotics and Flomax. He reports he has been able to have more regular urinations. He reports he can dress himself, cook somewhat, and go up and down the stairs. He stopped taking pain medication, last Thursday/Friday as he was getting constipated.    Pertinent History Pt is s/p R TKA 05/23/16.  He has orders for WBAT.  Pt reports he has not had any issues ascending/descending steps at home using crutch and a rail, has performed x2 since d/c from the hospital.  He has been ambulating with his RW since d/c and denies any falls.  Pt is retired and enjoys working  with [employer] for [employer].  Pt has h/o chronic low back pain.  [name] he injured his back when he was 70 years old when he was moving concrete and has had pain ever since.  Has tried multiple different interventions including going to a pain clinic. Pt has been sleeping on his L side while wearing his KI.  Has only been wearing KI when sleeping.  Pt has had most difficulty with bending over and donning pants and undergarments as well as bathing and has been having assist from wife for this. Pt was not using AD prior to R TKA and denies any falls in the past 6 months.    Limitations Standing;Walking;House hold activities   How long can you stand comfortably? 5 minutes   How long can you walk comfortably? 100 ft   Patient Stated Goals To be able to climb a ladder to a roof so he can continue volunteering with [employer]   Currently in Pain? Yes   Pain Location Knee   Pain Orientation Right   Pain Descriptors / Indicators Aching   Pain Type Chronic pain;Surgical pain   Pain Onset In the past 7 days   Pain Frequency Intermittent      Quad sets with knee extension mobilizations x8 total   Soft tissue mobilization to distal hamstrings as these were reported to initially be tight and painful, noted  to be in spasm on palpation.   Gait assessment -- initially noted to have knee flexion throughout gait cycle, decreased toe off... After knee extension mobilizations appeared to have less knee flexion in stance phase   Calf stretching manually via PT with knee extended, noted to lack DF.   Knee flexion mobilizations x 10 total for 5-10 second holds (reported incision pain, but not elsewhere)   Knee flexion in sitting - 87 degrees   Knee extension in supine - lacking 11 degrees from neutral    Long arc quad against min manual resistance x 12 for 2 sets -- well tolerated                             PT Education - 06/01/16 0952    Education provided Yes   Education  Details Plan of care, to keep leg in extension at least 1 hour per day.    Person(s) Educated Patient   Methods Explanation   Comprehension Verbalized understanding             PT Long Term Goals - 05/26/16 1225      PT LONG TERM GOAL #1   Title Pt will improve R knee flexion AROM by 30 degrees for improved mobility   Baseline 90 deg   Time 2   Period Weeks   Status New     PT LONG TERM GOAL #2   Title Pt will improve R knee extension strength to at least 4/5 to demonstrate improved strength   Baseline 3-/5   Time 6   Period Weeks   Status New     PT LONG TERM GOAL #3   Title Pt will improve 5xSTS to at least 14.2 seconds to demonstrate improved strength and balance   Baseline 26.81 seconds   Time 8   Period Weeks   Status New     PT LONG TERM GOAL #4   Title Pt will be able to ascend/descend a flight of steps without railings to work toward goal of ascending/descending ladder   Time 6   Period Weeks   Status New               Plan - 06/01/16 1254    Clinical Impression Statement Patient presents with significant limitation in extension ROM, impairing his gait. He has recently had a UTI, but appears to be recovering now. Educated patient to allow for passive extension stretch at home to regain more normalized walking pattern. Patient demonstrates pain/tightness/spasming of hamstrings, alleviated to some extent with soft tissue mobilization. Patient will continue to benefit from skilled PT services to address the listed deficits.    Rehab Potential Good   Clinical Impairments Affecting Rehab Potential (+) Very motivated, (-) h/o chronic low back pain   PT Frequency 2x / week   PT Duration 8 weeks   PT Treatment/Interventions ADLs/Self Care Home Management;Cryotherapy;Electrical Stimulation;Moist Heat;Iontophoresis 4mg /ml Dexamethasone;Ultrasound;Contrast Bath;DME Instruction;Gait training;Stair training;Functional mobility training;Therapeutic  activities;Therapeutic exercise;Balance training;Neuromuscular re-education;Patient/family education;Manual techniques;Scar mobilization;Passive range of motion;Compression bandaging;Dry needling;Energy conservation;Taping   PT Next Visit Plan Pt to bring in HEP provided to him at hospital (to review this), 20mWT, gait training, RLE strengthening and ROM program   PT Home Exercise Plan Supine R knee presses, seated R knee AAROM, seated R knee extension   Consulted and Agree with Plan of Care Patient;Family member/caregiver   Family Member Consulted wife      Patient will benefit from  skilled therapeutic intervention in order to improve the following deficits and impairments:  Abnormal gait, Decreased activity tolerance, Decreased balance, Decreased endurance, Decreased knowledge of use of DME, Decreased range of motion, Decreased safety awareness, Decreased scar mobility, Decreased strength, Difficulty walking, Increased edema, Increased fascial restricitons, Increased muscle spasms, Impaired perceived functional ability, Impaired flexibility, Improper body mechanics, Postural dysfunction, Pain  Visit Diagnosis: Acute pain of right knee  Difficulty in walking, not elsewhere classified  Muscle weakness (generalized)     Problem List Patient Active Problem List   Diagnosis Date Noted  . OA (osteoarthritis) of knee 05/23/2016  . Cough 01/13/2015  . Hearing loss 01/06/2015  . Chronic back pain 12/09/2014  . GERD (gastroesophageal reflux disease) 12/09/2014  . Obesity 12/09/2014  . PSA Elevation 12/09/2014  . Rosacea 12/09/2014  . Hyperlipidemia, mixed 07/13/2008  . Family history of GI tract cancer 07/09/2008  . Lumbago 06/25/2007  . Diverticulosis of colon without hemorrhage 09/08/2006   Kerman Passey, PT, DPT    06/01/2016, 12:58 PM  [place] [facility] [address] [place], [address] Phone: [phone]    Fax:  [phone]  Name: TRENTYN EKHOLM MRN: [id number] Date of Birth: [birth year]

## 2016-06-08 ENCOUNTER — Ambulatory Visit: Payer: Medicare Other | Admitting: Physical Therapy

## 2016-06-08 DIAGNOSIS — M6281 Muscle weakness (generalized): Secondary | ICD-10-CM

## 2016-06-08 DIAGNOSIS — R262 Difficulty in walking, not elsewhere classified: Secondary | ICD-10-CM | POA: Diagnosis not present

## 2016-06-08 DIAGNOSIS — M25561 Pain in right knee: Secondary | ICD-10-CM

## 2016-06-08 NOTE — Patient Instructions (Signed)
Knee extension on RLE - (3-4 degrees from neutral) LLE (hyper-extension -1 degree)

## 2016-06-08 NOTE — Therapy (Signed)
[place] [facility] [address], [place], [address] Phone: [phone]   Fax:  [phone]  Physical Therapy Treatment  Patient Details  Name: Alexander Moss MRN: [id number] Date of Birth: 10-Nov-1945 Referring Provider: Dione Plover. Aluisio  Encounter Date: 06/08/2016      PT End of Session - 06/08/16 0948    Visit Number 3   Number of Visits 17   Date for PT Re-Evaluation 07-30-16   Authorization Type g codes   Authorization Time Period 1/10   PT Start Time 0905   PT Stop Time 0945   PT Time Calculation (min) 40 min   Activity Tolerance Patient tolerated treatment well   Behavior During Therapy [facility] for tasks assessed/performed      Past Medical History:  Diagnosis Date  . Arthritis   . GERD (gastroesophageal reflux disease)     Past Surgical History:  Procedure Laterality Date  . [place]  . KNEE SURGERY Bilateral 2003  . TONSILLECTOMY AND ADENOIDECTOMY    . TOTAL KNEE ARTHROPLASTY Right 05/23/2016   Procedure: RIGHT TOTAL KNEE ARTHROPLASTY;  Surgeon: Gaynelle Arabian, MD;  Location: [facility];  Service: Orthopedics;  Laterality: Right;    There were no vitals filed for this visit.      Subjective Assessment - 06/08/16 0915    Subjective Patient reports he is tried b/c he cannot sleep with pain medications, but cannot sleep without them either. He reports pain over the mid portion of the incision and medial joint line primarily. He has had difficulty with maintaining the knee in passive extension.    Pertinent History Pt is s/p R TKA 05/23/16.  He has orders for WBAT.  Pt reports he has not had any issues ascending/descending steps at home using crutch and a rail, has performed x2 since d/c from the hospital.  He has been ambulating with his RW since d/c and denies any falls.  Pt is retired and enjoys working with [employer] for [employer].  Pt has h/o chronic low back pain.  [name] he injured his  back when he was 70 years old when he was moving concrete and has had pain ever since.  Has tried multiple different interventions including going to a pain clinic. Pt has been sleeping on his L side while wearing his KI.  Has only been wearing KI when sleeping.  Pt has had most difficulty with bending over and donning pants and undergarments as well as bathing and has been having assist from wife for this. Pt was not using AD prior to R TKA and denies any falls in the past 6 months.    Limitations Standing;Walking;House hold activities   How long can you stand comfortably? 5 minutes   How long can you walk comfortably? 100 ft   Patient Stated Goals To be able to climb a ladder to a roof so he can continue volunteering with [employer]   Currently in Pain? No/denies  Can feel tension with ROM, not pain right now      Knee extension on RLE - (3-4 degrees from neutral) LLE (hyper-extension -1 degree)  Quad set with knee extension mobilization x 5 (uncomfortable after 5th repetition)  R knee flexion ROM - 105 degrees   [place] electric stimulation over VMO and vastus intermedius x 11 min at 39 mA.  Soft tissue mobilization over distal hamstring, adductor insertion, lateral and medial quadriceps with reported pain over pes anserine, though reduced  after e-stim and soft tissue mobilization   Patellar mobilizations x 2 minutes, primarily medially -- initially reduced ROM, though improved with repetitions.   Patient reported no pain after session, just tightness.                           PT Education - 06/08/16 0948    Education provided Yes   Education Details Increase in ROM to date, plan of care. Massage sore spots to reduce pain.    Person(s) Educated Patient   Methods Explanation   Comprehension Verbalized understanding             PT Long Term Goals - 05/26/16 1225      PT LONG TERM GOAL #1   Title Pt will improve R knee flexion AROM by 30  degrees for improved mobility   Baseline 90 deg   Time 2   Period Weeks   Status New     PT LONG TERM GOAL #2   Title Pt will improve R knee extension strength to at least 4/5 to demonstrate improved strength   Baseline 3-/5   Time 6   Period Weeks   Status New     PT LONG TERM GOAL #3   Title Pt will improve 5xSTS to at least 14.2 seconds to demonstrate improved strength and balance   Baseline 26.81 seconds   Time 8   Period Weeks   Status New     PT LONG TERM GOAL #4   Title Pt will be able to ascend/descend a flight of steps without railings to work toward goal of ascending/descending ladder   Time [place] - 06/08/16 0949    Clinical Impression Statement Patient continues to have pain at home, however he reports it is decreasing in intensity. He has increased his ROM into both flexion and extension though continues to have higher levels of pain with ROM exercises in clinic. Provided modalities to reduce pain, in gait noticeable deficit in knee extension (end range) at heel strike. He is likely appropriate to switch over to a cane during his next follow up.    Rehab Potential Good   Clinical Impairments Affecting Rehab Potential (+) Very motivated, (-) h/o chronic low back pain   PT Frequency 2x / week   PT Duration 8 weeks   PT Treatment/Interventions ADLs/Self Care Home Management;Cryotherapy;Electrical Stimulation;Moist Heat;Iontophoresis 4mg /ml Dexamethasone;Ultrasound;Contrast Bath;DME Instruction;Gait training;Stair training;Functional mobility training;Therapeutic activities;Therapeutic exercise;Balance training;Neuromuscular re-education;Patient/family education;Manual techniques;Scar mobilization;Passive range of motion;Compression bandaging;Dry needling;Energy conservation;Taping   PT Next Visit Plan Pt to bring in HEP provided to him at hospital (to review this), 14mWT, gait training, RLE strengthening and ROM program    PT Home Exercise Plan Supine R knee presses, seated R knee AAROM, seated R knee extension   Consulted and Agree with Plan of Care Patient;Family member/caregiver   Family Member Consulted wife      Patient will benefit from skilled therapeutic intervention in order to improve the following deficits and impairments:  Abnormal gait, Decreased activity tolerance, Decreased balance, Decreased endurance, Decreased knowledge of use of DME, Decreased range of motion, Decreased safety awareness, Decreased scar mobility, Decreased strength, Difficulty walking, Increased edema, Increased fascial restricitons, Increased muscle spasms, Impaired perceived functional ability, Impaired flexibility, Improper body mechanics, Postural dysfunction, Pain  Visit Diagnosis: Acute pain of right knee  Difficulty  in walking, not elsewhere classified  Muscle weakness (generalized)     Problem List Patient Active Problem List   Diagnosis Date Noted  . OA (osteoarthritis) of knee 05/23/2016  . Cough 01/13/2015  . Hearing loss 01/06/2015  . Chronic back pain 12/09/2014  . GERD (gastroesophageal reflux disease) 12/09/2014  . Obesity 12/09/2014  . PSA Elevation 12/09/2014  . Rosacea 12/09/2014  . Hyperlipidemia, mixed 07/13/2008  . Family history of GI tract cancer 07/09/2008  . Lumbago 06/25/2007  . Diverticulosis of colon without hemorrhage 09/08/2006    Kerman Passey, PT, DPT    06/08/2016, 9:51 AM  [place] [facility] [address] [place], [address] Phone: [phone]   Fax:  [phone]  Name: Alexander Moss MRN: [id number] Date of Birth: August 09, 1945

## 2016-06-09 DIAGNOSIS — Z471 Aftercare following joint replacement surgery: Secondary | ICD-10-CM | POA: Diagnosis not present

## 2016-06-09 DIAGNOSIS — Z96651 Presence of right artificial knee joint: Secondary | ICD-10-CM | POA: Diagnosis not present

## 2016-06-13 ENCOUNTER — Ambulatory Visit: Payer: Medicare Other | Attending: Orthopedic Surgery | Admitting: Physical Therapy

## 2016-06-13 DIAGNOSIS — R262 Difficulty in walking, not elsewhere classified: Secondary | ICD-10-CM | POA: Insufficient documentation

## 2016-06-13 DIAGNOSIS — M6281 Muscle weakness (generalized): Secondary | ICD-10-CM

## 2016-06-13 DIAGNOSIS — M25561 Pain in right knee: Secondary | ICD-10-CM | POA: Diagnosis not present

## 2016-06-13 NOTE — Therapy (Signed)
[place] [facility] [address], [place], [address] Phone: [phone]   Fax:  [phone]  Physical Therapy Treatment  Patient Details  Name: Alexander Moss MRN: [id number] Date of Birth: 06/14/1946 Referring Provider: Dione Plover. Aluisio  Encounter Date: 06/13/2016      PT End of Session - 06/13/16 1049    Visit Number 4   Number of Visits 17   Date for PT Re-Evaluation Jul 31, 2016   Authorization Type g codes   Authorization Time Period 1/10   PT Start Time 1030   PT Stop Time 1112   PT Time Calculation (min) 42 min   Activity Tolerance Patient tolerated treatment well   Behavior During Therapy WFL for tasks assessed/performed      Past Medical History:  Diagnosis Date  . Arthritis   . GERD (gastroesophageal reflux disease)     Past Surgical History:  Procedure Laterality Date  . [place]  . KNEE SURGERY Bilateral 2003  . TONSILLECTOMY AND ADENOIDECTOMY    . TOTAL KNEE ARTHROPLASTY Right 05/23/2016   Procedure: RIGHT TOTAL KNEE ARTHROPLASTY;  Surgeon: Gaynelle Arabian, MD;  Location: [facility];  Service: Orthopedics;  Laterality: Right;    There were no vitals filed for this visit.      Subjective Assessment - 06/13/16 1033    Subjective Patient reports he had a pretty good weekend. He has had less sharp pain, more dull pain. He is not taking the hydrocodone anymore, just Tramadol and a muscle relaxer.    Pertinent History Pt is s/p R TKA 05/23/16.  He has orders for WBAT.  Pt reports he has not had any issues ascending/descending steps at home using crutch and a rail, has performed x2 since d/c from the hospital.  He has been ambulating with his RW since d/c and denies any falls.  Pt is retired and enjoys working with [employer] for [employer].  Pt has h/o chronic low back pain.  [name] he injured his back when he was 70 years old when he was moving concrete and has had pain ever since.  Has tried  multiple different interventions including going to a pain clinic. Pt has been sleeping on his L side while wearing his KI.  Has only been wearing KI when sleeping.  Pt has had most difficulty with bending over and donning pants and undergarments as well as bathing and has been having assist from wife for this. Pt was not using AD prior to R TKA and denies any falls in the past 6 months.    Limitations Standing;Walking;House hold activities   How long can you stand comfortably? 5 minutes   How long can you walk comfortably? 100 ft   Patient Stated Goals To be able to climb a ladder to a roof so he can continue volunteering with [employer]   Currently in Pain? No/denies  Just from the stitches, no sharp pain.       Gait observed with SPC, no AD -- noted to lack full hip extension on RLE during gait, mild ER/whip on RLE secondary to decreased DF ROM.   DF stretching in supine by manual technique with knee in extension x 45" for 2 bouts (began to feel more sharp pain in knee secondary to being in extension.   Hip flexor stretch x 2 minutes x 2 bouts with overpressure in supine   Patellar glides medial to lateral x 45" for 2 bouts (given the  combination of DF, extension, began to feel more sharp pain)  Put on Russian stim in sitting at 46 mA CC while performing LAQs with 2# x 12, 3# x 12, 5# x 12 (more challenging)   Observed gait after there-ex, patient noted to have improved hip extension on RLE with cuing to take longer steps on LLE.                            PT Education - 06/13/16 1049    Education provided Yes   Education Details He is progressing well, perform calf and hip flexor stretching to increase L stride length.    Person(s) Educated Patient   Methods Explanation;Demonstration;Handout   Comprehension Verbalized understanding;Returned demonstration             PT Long Term Goals - 05/26/16 1225      PT LONG TERM GOAL #1   Title Pt will  improve R knee flexion AROM by 30 degrees for improved mobility   Baseline 90 deg   Time 2   Period Weeks   Status New     PT LONG TERM GOAL #2   Title Pt will improve R knee extension strength to at least 4/5 to demonstrate improved strength   Baseline 3-/5   Time 6   Period Weeks   Status New     PT LONG TERM GOAL #3   Title Pt will improve 5xSTS to at least 14.2 seconds to demonstrate improved strength and balance   Baseline 26.81 seconds   Time 8   Period Weeks   Status New     PT LONG TERM GOAL #4   Title Pt will be able to ascend/descend a flight of steps without railings to work toward goal of ascending/descending ladder   Time 6   Period Weeks   Status New               Plan - 06/13/16 1050    Clinical Impression Statement Patient demonstrating decreased hip extension ROM on RLE during stance phase, likely a combination of hip flexor tightness and decreased DF ROM. He was provided with stretching for both, appears to be tolerating extension much better this date. Also noted to have improved extension at both heel strike and midstance, lacking only a few degrees.    Rehab Potential Good   Clinical Impairments Affecting Rehab Potential (+) Very motivated, (-) h/o chronic low back pain   PT Frequency 2x / week   PT Duration 8 weeks   PT Treatment/Interventions ADLs/Self Care Home Management;Cryotherapy;Electrical Stimulation;Moist Heat;Iontophoresis 4mg /ml Dexamethasone;Ultrasound;Contrast Bath;DME Instruction;Gait training;Stair training;Functional mobility training;Therapeutic activities;Therapeutic exercise;Balance training;Neuromuscular re-education;Patient/family education;Manual techniques;Scar mobilization;Passive range of motion;Compression bandaging;Dry needling;Energy conservation;Taping   PT Next Visit Plan Pt to bring in HEP provided to him at hospital (to review this), 71mWT, gait training, RLE strengthening and ROM program   PT Home Exercise Plan Supine R  knee presses, seated R knee AAROM, seated R knee extension   Consulted and Agree with Plan of Care Patient;Family member/caregiver   Family Member Consulted wife      Patient will benefit from skilled therapeutic intervention in order to improve the following deficits and impairments:  Abnormal gait, Decreased activity tolerance, Decreased balance, Decreased endurance, Decreased knowledge of use of DME, Decreased range of motion, Decreased safety awareness, Decreased scar mobility, Decreased strength, Difficulty walking, Increased edema, Increased fascial restricitons, Increased muscle spasms, Impaired perceived functional ability, Impaired flexibility, Improper body  mechanics, Postural dysfunction, Pain  Visit Diagnosis: Acute pain of right knee  Difficulty in walking, not elsewhere classified  Muscle weakness (generalized)     Problem List Patient Active Problem List   Diagnosis Date Noted  . OA (osteoarthritis) of knee 05/23/2016  . Cough 01/13/2015  . Hearing loss 01/06/2015  . Chronic back pain 12/09/2014  . GERD (gastroesophageal reflux disease) 12/09/2014  . Obesity 12/09/2014  . PSA Elevation 12/09/2014  . Rosacea 12/09/2014  . Hyperlipidemia, mixed 07/13/2008  . Family history of GI tract cancer 07/09/2008  . Lumbago 06/25/2007  . Diverticulosis of colon without hemorrhage 09/08/2006   Kerman Passey, PT, DPT    06/13/2016, 12:52 PM  [place] [facility] [address] [place], [address] Phone: [phone]   Fax:  [phone]  Name: Alexander Moss MRN: [id number] Date of Birth: [birth year]

## 2016-06-13 NOTE — Patient Instructions (Signed)
Gait observed with SPC, no AD -- noted to lack full hip extension on RLE during gait, mild   DF stretching  Hip flexor stretch x 2 minutes x 2 bouts with overpressure in supine   Patellar glides medial to lateral x 45" for 2 bouts (given the combination of DF, extension, began to feel more sharp pain)  Put on Russian stim in sitting at 46 mA CC while performing LAQs with 2# x 12, 3# x 12, 5# x 12 (more challenging)   Observed gait after

## 2016-06-16 ENCOUNTER — Ambulatory Visit: Payer: Medicare Other | Admitting: Physical Therapy

## 2016-06-16 DIAGNOSIS — M6281 Muscle weakness (generalized): Secondary | ICD-10-CM | POA: Diagnosis not present

## 2016-06-16 DIAGNOSIS — M25561 Pain in right knee: Secondary | ICD-10-CM

## 2016-06-16 DIAGNOSIS — R262 Difficulty in walking, not elsewhere classified: Secondary | ICD-10-CM | POA: Diagnosis not present

## 2016-06-16 NOTE — Patient Instructions (Signed)
Leg Press - 25# x 12, 35# x 10, 45# x10 on RLE only with bilateral LEs 85# x 12 repetitions for 2 sets   5x sit to stand without use of hands - 13.10 seconds   TUG - 8.6 seconds without hands    26m walk - 8.17 seconds   FGA - 30/30   SLS - 5" on RLE, 3" on LLE   Measurement of  Thighs 5cm superior pole of patella L- 45.3 cm, 51.0cm on RLE

## 2016-06-16 NOTE — Therapy (Signed)
[place] [facility] [address], [place], [address] Phone: [phone]   Fax:  [phone]  Physical Therapy Treatment  Patient Details  Name: Alexander Moss MRN: [id number] Date of Birth: [birth year] Referring Provider: Dione Plover. Aluisio  Encounter Date: 06/16/2016      PT End of Session - 06/16/16 1033    Visit Number 5   Number of Visits 17   Date for PT Re-Evaluation 07/31/16   Authorization Type g codes   Authorization Time Period 1/10   PT Start Time 0945   PT Stop Time 1030   PT Time Calculation (min) 45 min   Activity Tolerance Patient tolerated treatment well   Behavior During Therapy WFL for tasks assessed/performed      Past Medical History:  Diagnosis Date  . Arthritis   . GERD (gastroesophageal reflux disease)     Past Surgical History:  Procedure Laterality Date  . [place]  . KNEE SURGERY Bilateral 2003  . TONSILLECTOMY AND ADENOIDECTOMY    . TOTAL KNEE ARTHROPLASTY Right 05/23/2016   Procedure: RIGHT TOTAL KNEE ARTHROPLASTY;  Surgeon: Gaynelle Arabian, MD;  Location: [facility];  Service: Orthopedics;  Laterality: Right;    There were no vitals filed for this visit.      Subjective Assessment - 06/16/16 0953    Subjective Patient reports he did not take any pain medication before bed last night and had some pressure/clamp like pain. Otherwise he presents with no walker, he has been able to go up steps.    Pertinent History Pt is s/p R TKA 05/23/16.  He has orders for WBAT.  Pt reports he has not had any issues ascending/descending steps at home using crutch and a rail, has performed x2 since d/c from the hospital.  He has been ambulating with his RW since d/c and denies any falls.  Pt is retired and enjoys working with [employer] for [employer].  Pt has h/o chronic low back pain.  [name] he injured his back when he was 70 years old when he was moving concrete and has had pain ever  since.  Has tried multiple different interventions including going to a pain clinic. Pt has been sleeping on his L side while wearing his KI.  Has only been wearing KI when sleeping.  Pt has had most difficulty with bending over and donning pants and undergarments as well as bathing and has been having assist from wife for this. Pt was not using AD prior to R TKA and denies any falls in the past 6 months.    Limitations Standing;Walking;House hold activities   How long can you stand comfortably? 5 minutes   How long can you walk comfortably? 100 ft   Patient Stated Goals To be able to climb a ladder to a roof so he can continue volunteering with [employer]   Currently in Pain? No/denies      Leg Press - 25# x 12, 35# x 10, 45# x10 on RLE only with bilateral LEs 85# x 12 repetitions for 2 sets   5x sit to stand without use of hands - 13.10 seconds   TUG - 8.6 seconds without hands    75m walk - 8.17 seconds   FGA - 30/30   SLS - 5" on RLE, 3" on LLE   Measurement of  Thighs 5cm superior pole of patella L- 45.3 cm, 51.0cm on RLE  Single leg stance on blue foam  pad with 2 finger support on contralateral x 8 per side for 8" holds (RLE more difficulty than LLE)   Rockerboard forward/backward, and laterally x 15" bouts x 8 in each direction (initially struggled with lateral, though showed significant improvement with repetitions).                            PT Education - 06/16/16 1027    Education provided Yes   Education Details His ROM has improved as has dynamic balance, strength and static balance have deficits still.    Person(s) Educated Patient   Methods Explanation;Demonstration   Comprehension Verbalized understanding;Returned demonstration             PT Long Term Goals - 05/26/16 1225      PT LONG TERM GOAL #1   Title Pt will improve R knee flexion AROM by 30 degrees for improved mobility   Baseline 90 deg   Time 2   Period Weeks    Status New     PT LONG TERM GOAL #2   Title Pt will improve R knee extension strength to at least 4/5 to demonstrate improved strength   Baseline 3-/5   Time 6   Period Weeks   Status New     PT LONG TERM GOAL #3   Title Pt will improve 5xSTS to at least 14.2 seconds to demonstrate improved strength and balance   Baseline 26.81 seconds   Time 8   Period Weeks   Status New     PT LONG TERM GOAL #4   Title Pt will be able to ascend/descend a flight of steps without railings to work toward goal of ascending/descending ladder   Time 6   Period Weeks   Status New               Plan - 06/16/16 1034    Clinical Impression Statement Patient observed without AD, FGA score of 30/30 indicates dynamic balance has returned to baseline, continues to have deficits in single leg stance bilaterally. His ROM now lacks ~ 1 degree of extension, quadricep strength and activation is improving as well, all balance measures WNL today other than single leg stance.    Rehab Potential Good   Clinical Impairments Affecting Rehab Potential (+) Very motivated, (-) h/o chronic low back pain   PT Frequency 2x / week   PT Duration 8 weeks   PT Treatment/Interventions ADLs/Self Care Home Management;Cryotherapy;Electrical Stimulation;Moist Heat;Iontophoresis 4mg /ml Dexamethasone;Ultrasound;Contrast Bath;DME Instruction;Gait training;Stair training;Functional mobility training;Therapeutic activities;Therapeutic exercise;Balance training;Neuromuscular re-education;Patient/family education;Manual techniques;Scar mobilization;Passive range of motion;Compression bandaging;Dry needling;Energy conservation;Taping   PT Next Visit Plan Pt to bring in HEP provided to him at hospital (to review this), 73mWT, gait training, RLE strengthening and ROM program   PT Home Exercise Plan Supine R knee presses, seated R knee AAROM, seated R knee extension   Consulted and Agree with Plan of Care Patient;Family member/caregiver    Family Member Consulted wife      Patient will benefit from skilled therapeutic intervention in order to improve the following deficits and impairments:  Abnormal gait, Decreased activity tolerance, Decreased balance, Decreased endurance, Decreased knowledge of use of DME, Decreased range of motion, Decreased safety awareness, Decreased scar mobility, Decreased strength, Difficulty walking, Increased edema, Increased fascial restricitons, Increased muscle spasms, Impaired perceived functional ability, Impaired flexibility, Improper body mechanics, Postural dysfunction, Pain  Visit Diagnosis: Difficulty in walking, not elsewhere classified  Muscle weakness (generalized)  Acute pain  of right knee     Problem List Patient Active Problem List   Diagnosis Date Noted  . OA (osteoarthritis) of knee 05/23/2016  . Cough 01/13/2015  . Hearing loss 01/06/2015  . Chronic back pain 12/09/2014  . GERD (gastroesophageal reflux disease) 12/09/2014  . Obesity 12/09/2014  . PSA Elevation 12/09/2014  . Rosacea 12/09/2014  . Hyperlipidemia, mixed 07/13/2008  . Family history of GI tract cancer 07/09/2008  . Lumbago 06/25/2007  . Diverticulosis of colon without hemorrhage 09/08/2006   Kerman Passey, PT, DPT    06/16/2016, 1:40 PM  [facility] [address] [place], [address] Phone: [phone]   Fax:  [phone]  Name: Alexander Moss MRN: [id number] Date of Birth: [birth year]

## 2016-06-20 ENCOUNTER — Ambulatory Visit: Payer: Medicare Other | Admitting: Physical Therapy

## 2016-06-20 DIAGNOSIS — R262 Difficulty in walking, not elsewhere classified: Secondary | ICD-10-CM

## 2016-06-20 DIAGNOSIS — M25561 Pain in right knee: Secondary | ICD-10-CM | POA: Diagnosis not present

## 2016-06-20 DIAGNOSIS — M6281 Muscle weakness (generalized): Secondary | ICD-10-CM

## 2016-06-20 NOTE — Therapy (Signed)
[facility] [address] [place], [address] Phone: [phone]   Fax:  [phone]  Physical Therapy Treatment  Patient Details  Name: Alexander Moss MRN: [id number] Date of Birth: 05-20-1946 Referring Provider: Dione Plover. Aluisio  Encounter Date: 06/20/2016      PT End of Session - 06/20/16 1100    Visit Number 6   Number of Visits 17   Date for PT Re-Evaluation 2016/07/31   Authorization Type g codes   Authorization Time Period 1/10   PT Start Time 1026   PT Stop Time 1105   PT Time Calculation (min) 39 min   Activity Tolerance Patient tolerated treatment well   Behavior During Therapy WFL for tasks assessed/performed      Past Medical History:  Diagnosis Date  . Arthritis   . GERD (gastroesophageal reflux disease)     Past Surgical History:  Procedure Laterality Date  . [place]  . KNEE SURGERY Bilateral 2003  . TONSILLECTOMY AND ADENOIDECTOMY    . TOTAL KNEE ARTHROPLASTY Right 05/23/2016   Procedure: RIGHT TOTAL KNEE ARTHROPLASTY;  Surgeon: Gaynelle Arabian, MD;  Location: [facility];  Service: Orthopedics;  Laterality: Right;    There were no vitals filed for this visit.      Subjective Assessment - 06/20/16 1027    Subjective Patient reports he was quite active with his grandchildren over the weekend. He reports he has some very mild pain around jointline still, no falls. He has had slight difficulty ascending/descending steps.    Pertinent History Pt is s/p R TKA 05/23/16.  He has orders for WBAT.  Pt reports he has not had any issues ascending/descending steps at home using crutch and a rail, has performed x2 since d/c from the hospital.  He has been ambulating with his RW since d/c and denies any falls.  Pt is retired and enjoys working with [employer] for [employer].  Pt has h/o chronic low back pain.  [name] he injured his back when he was 70 years old when he was moving concrete and  has had pain ever since.  Has tried multiple different interventions including going to a pain clinic. Pt has been sleeping on his L side while wearing his KI.  Has only been wearing KI when sleeping.  Pt has had most difficulty with bending over and donning pants and undergarments as well as bathing and has been having assist from wife for this. Pt was not using AD prior to R TKA and denies any falls in the past 6 months.    Limitations Standing;Walking;House hold activities   How long can you stand comfortably? 5 minutes   How long can you walk comfortably? 100 ft   Patient Stated Goals To be able to climb a ladder to a roof so he can continue volunteering with [employer]   Currently in Pain? --  "Just the normal pain"   Pain Location Knee   Pain Orientation Right   Pain Descriptors / Indicators Aching   Pain Type Chronic pain;Surgical pain   Pain Onset 1 to 4 weeks ago   Pain Frequency Intermittent      Leg Press - 45# x 12, 55# x 13, 55# x 12  on RLE only    Partial ROM lunges x 8 for 2 sets per side with bilateral HHA (cued for technique and set up)   Single leg squats on TG at level 19 with toe  touch assist from LLE x 12, progressed to level 24 with RLE x 12 for 2 sets   Single leg stance on RLE on blue foam pad only 5-10" holds for 2 bouts x 8 repetitions   Observed patient ascending/descending steps -- noted to have mild ER at the hip on descent, provided DF stretching on the step x 10 for 5-10" which allowed him to bring foot into more neutral position on descent afterwards.    Step ups to 2 risers with minimal bilateral HHA support on RLE only x 12 for 2 sets   Standing hip abductions with yellow t-band x 12 per side for 2 sets with unilateral hand hold assistance.                            PT Education - 06/20/16 1100    Education provided Yes   Education Details Patient progressing well, continue to focus on strength development, calf  stretching to gear towards discharge.    Person(s) Educated Patient   Methods Explanation;Demonstration   Comprehension Verbalized understanding;Returned demonstration             PT Long Term Goals - 05/26/16 1225      PT LONG TERM GOAL #1   Title Pt will improve R knee flexion AROM by 30 degrees for improved mobility   Baseline 90 deg   Time 2   Period Weeks   Status New     PT LONG TERM GOAL #2   Title Pt will improve R knee extension strength to at least 4/5 to demonstrate improved strength   Baseline 3-/5   Time 6   Period Weeks   Status New     PT LONG TERM GOAL #3   Title Pt will improve 5xSTS to at least 14.2 seconds to demonstrate improved strength and balance   Baseline 26.81 seconds   Time 8   Period Weeks   Status New     PT LONG TERM GOAL #4   Title Pt will be able to ascend/descend a flight of steps without railings to work toward goal of ascending/descending ladder   Time 6   Period Weeks   Status New               Plan - 06/20/16 1100    Clinical Impression Statement Patient progressing well with strengthening activities in this session, reports feeling better after the session. He has some limitations in DF ROM, causing him to externally rotate at his hip to descend steps, reduced with cuing and calf stretching. He is progressing well, though requires additional strengthening through flexion ROM of his knee/quadricep musculature.    Rehab Potential Good   Clinical Impairments Affecting Rehab Potential (+) Very motivated, (-) h/o chronic low back pain   PT Frequency 2x / week   PT Duration 8 weeks   PT Treatment/Interventions ADLs/Self Care Home Management;Cryotherapy;Electrical Stimulation;Moist Heat;Iontophoresis 4mg /ml Dexamethasone;Ultrasound;Contrast Bath;DME Instruction;Gait training;Stair training;Functional mobility training;Therapeutic activities;Therapeutic exercise;Balance training;Neuromuscular re-education;Patient/family  education;Manual techniques;Scar mobilization;Passive range of motion;Compression bandaging;Dry needling;Energy conservation;Taping   PT Next Visit Plan Pt to bring in HEP provided to him at hospital (to review this), 53mWT, gait training, RLE strengthening and ROM program   PT Home Exercise Plan Supine R knee presses, seated R knee AAROM, seated R knee extension   Consulted and Agree with Plan of Care Patient;Family member/caregiver   Family Member Consulted wife      Patient will benefit from  skilled therapeutic intervention in order to improve the following deficits and impairments:  Abnormal gait, Decreased activity tolerance, Decreased balance, Decreased endurance, Decreased knowledge of use of DME, Decreased range of motion, Decreased safety awareness, Decreased scar mobility, Decreased strength, Difficulty walking, Increased edema, Increased fascial restricitons, Increased muscle spasms, Impaired perceived functional ability, Impaired flexibility, Improper body mechanics, Postural dysfunction, Pain  Visit Diagnosis: Difficulty in walking, not elsewhere classified  Muscle weakness (generalized)  Acute pain of right knee     Problem List Patient Active Problem List   Diagnosis Date Noted  . OA (osteoarthritis) of knee 05/23/2016  . Cough 01/13/2015  . Hearing loss 01/06/2015  . Chronic back pain 12/09/2014  . GERD (gastroesophageal reflux disease) 12/09/2014  . Obesity 12/09/2014  . PSA Elevation 12/09/2014  . Rosacea 12/09/2014  . Hyperlipidemia, mixed 07/13/2008  . Family history of GI tract cancer 07/09/2008  . Lumbago 06/25/2007  . Diverticulosis of colon without hemorrhage 09/08/2006   Kerman Passey, PT, DPT    06/20/2016, 11:15 AM  [facility] [address], [place], [address] Phone: [phone]   Fax:  [phone]  Name: Alexander Moss MRN: [id number] Date of Birth:  17-Jun-1946

## 2016-06-20 NOTE — Patient Instructions (Addendum)
Leg Press - 45# x 12, 55# x 13, 55# x 12  on RLE only    Partial ROM lunges x 8 for 2 sets per side with bilateral HHA (cued for technique and set up)   Single leg squats on TG at level 19 with toe touch assist from LLE x 12, progressed to level 24 with RLE x 12 for 2 sets   Single leg stance on RLE only 5-10" holds for 2 bouts x 8 repetitions   Observed patient ascending/descending steps    Step ups to 2 risers with minimal bilateral HHA support on RLE only x 12 for 2 sets   Standing hip abductions with yellow t-band x 12 per side for 2 sets with unilateral hand hold assistance.

## 2016-06-23 ENCOUNTER — Ambulatory Visit: Payer: Medicare Other | Admitting: Physical Therapy

## 2016-06-23 DIAGNOSIS — M25561 Pain in right knee: Secondary | ICD-10-CM | POA: Diagnosis not present

## 2016-06-23 DIAGNOSIS — R262 Difficulty in walking, not elsewhere classified: Secondary | ICD-10-CM | POA: Diagnosis not present

## 2016-06-23 DIAGNOSIS — M6281 Muscle weakness (generalized): Secondary | ICD-10-CM

## 2016-06-27 ENCOUNTER — Ambulatory Visit: Payer: Medicare Other | Admitting: Physical Therapy

## 2016-06-27 DIAGNOSIS — M25561 Pain in right knee: Secondary | ICD-10-CM | POA: Diagnosis not present

## 2016-06-27 DIAGNOSIS — R262 Difficulty in walking, not elsewhere classified: Secondary | ICD-10-CM | POA: Diagnosis not present

## 2016-06-27 DIAGNOSIS — M6281 Muscle weakness (generalized): Secondary | ICD-10-CM

## 2016-06-27 NOTE — Patient Instructions (Addendum)
Sit to stands without use of hands (easy, but symptoms in anterior knee)  Calf stretching in standing x 10 for 5" holds on RLE performed 10 repetitions of sit to stands with 10# DB (5# too easy) -- 8 on second set    Side stepping with green t-band x 12 per set for 2 sets (challenging on the R side).   Step Ups x 12 onto 2 risers for 2 sets on RLE 1 set on LLE   Long arc quads with green t-band x 10 for 2 sets on RLE

## 2016-06-27 NOTE — Therapy (Signed)
[facility] [address], [place], [address] Phone: [phone]   Fax:  [phone]  Physical Therapy Treatment  Patient Details  Name: Alexander Moss MRN: [id number] Date of Birth: May 27, 1946 Referring Provider: Dione Plover. Aluisio  Encounter Date: 06/23/2016      PT End of Session - 06/27/16 0001    Visit Number 7   Number of Visits 17   Date for PT Re-Evaluation 07/29/2016   Authorization Type g codes   Authorization Time Period 1/10   PT Start Time 0917   PT Stop Time 1000   PT Time Calculation (min) 43 min   Activity Tolerance Patient tolerated treatment well   Behavior During Therapy [facility] for tasks assessed/performed      Past Medical History:  Diagnosis Date  . Arthritis   . GERD (gastroesophageal reflux disease)     Past Surgical History:  Procedure Laterality Date  . [place]  . KNEE SURGERY Bilateral 2003  . TONSILLECTOMY AND ADENOIDECTOMY    . TOTAL KNEE ARTHROPLASTY Right 05/23/2016   Procedure: RIGHT TOTAL KNEE ARTHROPLASTY;  Surgeon: Gaynelle Arabian, MD;  Location: [facility];  Service: Orthopedics;  Laterality: Right;    There were no vitals filed for this visit.      Subjective Assessment - 06/27/16 0000    Subjective Patient reports he has had minor pain, but more fatigue while trying to perform his more active duties (like chopping wood). 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    Pertinent History Pt is s/p R TKA 05/23/16.  He has orders for WBAT.  Pt reports he has not had any issues ascending/descending steps at home using crutch and a rail, has performed x2 since d/c from the hospital.  He has been ambulating with his RW since d/c and denies any falls.  Pt is retired and enjoys working with [employer] for [employer].  Pt has h/o chronic low back pain.  [name] he injured his back when he was 70 years old when he was  moving concrete and has had pain ever since.  Has tried multiple different interventions including going to a pain clinic. Pt has been sleeping on his L side while wearing his KI.  Has only been wearing KI when sleeping.  Pt has had most difficulty with bending over and donning pants and undergarments as well as bathing and has been having assist from wife for this. Pt was not using AD prior to R TKA and denies any falls in the past 6 months.    Limitations Standing;Walking;House hold activities   How long can you stand comfortably? 5 minutes   How long can you walk comfortably? 100 ft   Patient Stated Goals To be able to climb a ladder to a roof so he can continue volunteering with [employer]   Currently in Pain? Other (Comment)  Patient reports fatigue, but not overt pain in his LEs.       Single leg Y star Balance   L stance 30" forward, 25.5" lateral, 22" posterior , 34" at 45 degrees antero-lateral  R stance - 29" anterior, 25" lateral, 26" posterior, 32" at 45 degree angle antero-lateral   Observed functional task of getting on and off of roofs from ladder, which requires single leg stance, R knee flexion over edge of roof. Simulated with blue foam, cones to act as edge  of roof, performed single leg stance, brought R knee over cones and transitioned from LLE to RLE in SLS after stepping over cones (initially difficulty for him, though cuing to increase knee flexion and slow transition improved performance (less lateral sway)).   Observed patient complete rotational ball slams with 2 kg ball, noted initially to rotate through lumbar spine, required excessive UE involvement due to decreased triple extension to mass flexion through LEs. Educated to complete with more knee flexion simulating cutting wood at home, used video feedback of therapist vs patient to reinforce. Patient able to perform next 5-10 repetitions with improved mechanics, increased hip rotation and knee/hip  flexion/extension.   Patient observed ascending/descending steps which he can now do without use of hand rails, no increase in pain.   Performed single leg balance with rotations with minimal hand hold assistance on flat ground and with blue foam pad, performed appropriately, but continues with deficits with blue foam pad (likely due to proprioceptive deficits residual to TKR).                            PT Education - 06/27/16 0000    Education provided Yes   Education Details Will work towards single leg balance and dynamic knee flexion to prepare for performing recreational tasks.    Person(s) Educated Patient   Methods Explanation;Demonstration;Handout   Comprehension Verbalized understanding;Returned demonstration             PT Long Term Goals - 06/23/16 0932      PT LONG TERM GOAL #1   Title Pt will improve R knee flexion AROM by 30 degrees for improved mobility   Baseline 90 deg   Time 2   Period Weeks   Status Achieved     PT LONG TERM GOAL #2   Title Pt will improve R knee extension strength to at least 4/5 to demonstrate improved strength   Baseline 3-/5   Time 6   Period Weeks   Status Achieved     PT LONG TERM GOAL #3   Title Pt will improve 5xSTS to at least 14.2 seconds to demonstrate improved strength and balance   Baseline 26.81 seconds   Time 8   Period Weeks   Status Achieved     PT LONG TERM GOAL #4   Title Pt will be able to ascend/descend a flight of steps without railings to work toward goal of ascending/descending ladder   Time 6   Period Weeks   Status Achieved               Plan - 06/27/16 0003    Clinical Impression Statement Patient has made excellent progress with all measures of strength, static/dynamic balance, and pain free range of motion. He has a goal of performing dynamic tasks involving, power, rotation, and dynamic single leg balance, all of which were addressed today and provided feedback/cuing to  work on at home. Patient is progressing well and will be prepared with HEP for discharge.   Rehab Potential Good   Clinical Impairments Affecting Rehab Potential (+) Very motivated, (-) h/o chronic low back pain   PT Frequency 2x / week   PT Duration 8 weeks   PT Treatment/Interventions ADLs/Self Care Home Management;Cryotherapy;Electrical Stimulation;Moist Heat;Iontophoresis 4mg /ml Dexamethasone;Ultrasound;Contrast Bath;DME Instruction;Gait training;Stair training;Functional mobility training;Therapeutic activities;Therapeutic exercise;Balance training;Neuromuscular re-education;Patient/family education;Manual techniques;Scar mobilization;Passive range of motion;Compression bandaging;Dry needling;Energy conservation;Taping   PT Next Visit Plan Pt to bring in HEP provided to  him at hospital (to review this), 71mWT, gait training, RLE strengthening and ROM program   PT Home Exercise Plan Supine R knee presses, seated R knee AAROM, seated R knee extension   Consulted and Agree with Plan of Care Patient;Family member/caregiver   Family Member Consulted wife      Patient will benefit from skilled therapeutic intervention in order to improve the following deficits and impairments:  Abnormal gait, Decreased activity tolerance, Decreased balance, Decreased endurance, Decreased knowledge of use of DME, Decreased range of motion, Decreased safety awareness, Decreased scar mobility, Decreased strength, Difficulty walking, Increased edema, Increased fascial restricitons, Increased muscle spasms, Impaired perceived functional ability, Impaired flexibility, Improper body mechanics, Postural dysfunction, Pain  Visit Diagnosis: Difficulty in walking, not elsewhere classified  Muscle weakness (generalized)  Acute pain of right knee     Problem List Patient Active Problem List   Diagnosis Date Noted  . OA (osteoarthritis) of knee 05/23/2016  . Cough 01/13/2015  . Hearing loss 01/06/2015  . Chronic back  pain 12/09/2014  . GERD (gastroesophageal reflux disease) 12/09/2014  . Obesity 12/09/2014  . PSA Elevation 12/09/2014  . Rosacea 12/09/2014  . Hyperlipidemia, mixed 07/13/2008  . Family history of GI tract cancer 07/09/2008  . Lumbago 06/25/2007  . Diverticulosis of colon without hemorrhage 09/08/2006   Kerman Passey, PT, DPT    06/27/2016, 12:04 AM  [facility] [address] [place], [address] Phone: [phone]   Fax:  [phone]  Name: Alexander Moss MRN: [id number] Date of Birth: [birth year]

## 2016-06-27 NOTE — Therapy (Signed)
[facility] [address] [place], [address] Phone: [phone]   Fax:  [phone]  Physical Therapy Treatment  Patient Details  Name: Alexander Moss MRN: [id number] Date of Birth: 05/18/1946 Referring Provider: Dione Plover. Aluisio  Encounter Date: 06/27/2016      PT End of Session - 06/27/16 1053    Visit Number 8   Number of Visits 17   Date for PT Re-Evaluation 18-Aug-2016   Authorization Type g codes   Authorization Time Period 2/10   PT Start Time 1036   PT Stop Time 1115   PT Time Calculation (min) 39 min   Activity Tolerance Patient tolerated treatment well   Behavior During Therapy WFL for tasks assessed/performed      Past Medical History:  Diagnosis Date  . Arthritis   . GERD (gastroesophageal reflux disease)     Past Surgical History:  Procedure Laterality Date  . [place]  . KNEE SURGERY Bilateral 2003  . TONSILLECTOMY AND ADENOIDECTOMY    . TOTAL KNEE ARTHROPLASTY Right 05/23/2016   Procedure: RIGHT TOTAL KNEE ARTHROPLASTY;  Surgeon: Gaynelle Arabian, MD;  Location: [facility];  Service: Orthopedics;  Laterality: Right;    There were no vitals filed for this visit.      Subjective Assessment - 06/27/16 1038    Subjective Patient reports he has practiced balance activities from previous session. He has not had much if any pain aside from terminal extensions or initially after prolonged sitting.    Pertinent History Pt is s/p R TKA 05/23/16.  He has orders for WBAT.  Pt reports he has not had any issues ascending/descending steps at home using crutch and a rail, has performed x2 since d/c from the hospital.  He has been ambulating with his RW since d/c and denies any falls.  Pt is retired and enjoys working with [employer] for [employer].  Pt has h/o chronic low back pain.  Alexander Moss he injured his back when he was 70 years old when he was moving concrete and has had pain ever since.  Has  tried multiple different interventions including going to a pain clinic. Pt has been sleeping on his L side while wearing his KI.  Has only been wearing KI when sleeping.  Pt has had most difficulty with bending over and donning pants and undergarments as well as bathing and has been having assist from wife for this. Pt was not using AD prior to R TKA and denies any falls in the past 6 months.    Limitations Standing;Walking;House hold activities   How long can you stand comfortably? 5 minutes   How long can you walk comfortably? 100 ft   Patient Stated Goals To be able to climb a ladder to a roof so he can continue volunteering with [employer]   Currently in Pain? No/denies      Sit to stands without use of hands (easy, but symptoms in anterior knee)  Calf stretching in standing x 10 for 5" holds on RLE performed 10 repetitions of sit to stands with 10# DB (5# too easy) -- 8 on second set    Side stepping with green t-band x 12 per set for 2 sets (challenging on the R side).   Step Ups x 12 onto 2 risers for 2 sets on RLE 1 set on LLE   Long arc quads with green t-band x 10 for 2 sets on RLE  PT Education - 14-Jul-2016 1105    Education provided Yes   Education Details Follow up in 2-3 weeks as he has met ROM goals, strength and balance are appropriate.    Person(s) Educated Patient   Methods Explanation;Demonstration;Handout   Comprehension Verbalized understanding;Returned demonstration             PT Long Term Goals - 06/23/16 0932      PT LONG TERM GOAL #1   Title Pt will improve R knee flexion AROM by 30 degrees for improved mobility   Baseline 90 deg   Time 2   Period Weeks   Status Achieved     PT LONG TERM GOAL #2   Title Pt will improve R knee extension strength to at least 4/5 to demonstrate improved strength   Baseline 3-/5   Time 6   Period Weeks   Status Achieved     PT LONG TERM GOAL #3   Title  Pt will improve 5xSTS to at least 14.2 seconds to demonstrate improved strength and balance   Baseline 26.81 seconds   Time 8   Period Weeks   Status Achieved     PT LONG TERM GOAL #4   Title Pt will be able to ascend/descend a flight of steps without railings to work toward goal of ascending/descending ladder   Time 6   Period Weeks   Status Achieved               Plan - 07/14/16 1054    Clinical Impression Statement Patient demonstrating appropriate balance and ROM, minimal if any pain aside from prolonged sitting or fatigue. He was provided with strength building/maintenance routine for this session directed at quadriceps deficits often associated with TKR. He has progressed quite well, informed to return in 2-3 weeks for discharge instructions.    Rehab Potential Good   Clinical Impairments Affecting Rehab Potential (+) Very motivated, (-) h/o chronic low back pain   PT Frequency 2x / week   PT Duration 8 weeks   PT Treatment/Interventions ADLs/Self Care Home Management;Cryotherapy;Electrical Stimulation;Moist Heat;Iontophoresis 67m/ml Dexamethasone;Ultrasound;Contrast Bath;DME Instruction;Gait training;Stair training;Functional mobility training;Therapeutic activities;Therapeutic exercise;Balance training;Neuromuscular re-education;Patient/family education;Manual techniques;Scar mobilization;Passive range of motion;Compression bandaging;Dry needling;Energy conservation;Taping   PT Next Visit Plan Pt to bring in HEP provided to him at hospital (to review this), 155m, gait training, RLE strengthening and ROM program   PT Home Exercise Plan Supine R knee presses, seated R knee AAROM, seated R knee extension   Consulted and Agree with Plan of Care Patient;Family member/caregiver   Family Member Consulted wife      Patient will benefit from skilled therapeutic intervention in order to improve the following deficits and impairments:  Abnormal gait, Decreased activity tolerance,  Decreased balance, Decreased endurance, Decreased knowledge of use of DME, Decreased range of motion, Decreased safety awareness, Decreased scar mobility, Decreased strength, Difficulty walking, Increased edema, Increased fascial restricitons, Increased muscle spasms, Impaired perceived functional ability, Impaired flexibility, Improper body mechanics, Postural dysfunction, Pain  Visit Diagnosis: Muscle weakness (generalized)  Difficulty in walking, not elsewhere classified  Acute pain of right knee       G-Codes - 1201/04/18655    Functional Assessment Tool Used Clinical judgement, 5x sit to stand, MMT, ROM    Functional Limitation Mobility: Walking and moving around   Mobility: Walking and Moving Around Current Status (G([id number] least 1 percent but less than 20 percent impaired, limited or restricted   Mobility: Walking and Moving Around Goal Status (G(X3244At  least 1 percent but less than 20 percent impaired, limited or restricted      Problem List Patient Active Problem List   Diagnosis Date Noted  . OA (osteoarthritis) of knee 05/23/2016  . Cough 01/13/2015  . Hearing loss 01/06/2015  . Chronic back pain 12/09/2014  . GERD (gastroesophageal reflux disease) 12/09/2014  . Obesity 12/09/2014  . PSA Elevation 12/09/2014  . Rosacea 12/09/2014  . Hyperlipidemia, mixed 07/13/2008  . Family history of GI tract cancer 07/09/2008  . Lumbago 06/25/2007  . Diverticulosis of colon without hemorrhage 09/08/2006   Kerman Passey, PT, DPT    06/27/2016, 4:56 PM  [facility] [address], [place], [address] Phone: [phone]   Fax:  [phone]  Name: BROGAN MARTIS MRN: [id number] Date of Birth: 17-Apr-1946

## 2016-06-28 DIAGNOSIS — Z471 Aftercare following joint replacement surgery: Secondary | ICD-10-CM | POA: Diagnosis not present

## 2016-06-28 DIAGNOSIS — Z96651 Presence of right artificial knee joint: Secondary | ICD-10-CM | POA: Diagnosis not present

## 2016-06-30 ENCOUNTER — Encounter: Payer: Medicare Other | Admitting: Physical Therapy

## 2016-07-05 ENCOUNTER — Encounter: Payer: Medicare Other | Admitting: Physical Therapy

## 2016-07-07 ENCOUNTER — Encounter: Payer: Medicare Other | Admitting: Physical Therapy

## 2016-07-14 ENCOUNTER — Encounter: Payer: Medicare Other | Admitting: Physical Therapy

## 2016-07-18 ENCOUNTER — Ambulatory Visit: Payer: Medicare Other | Attending: Orthopedic Surgery | Admitting: Physical Therapy

## 2016-07-18 DIAGNOSIS — R262 Difficulty in walking, not elsewhere classified: Secondary | ICD-10-CM

## 2016-07-18 DIAGNOSIS — M6281 Muscle weakness (generalized): Secondary | ICD-10-CM | POA: Diagnosis not present

## 2016-07-18 DIAGNOSIS — M25561 Pain in right knee: Secondary | ICD-10-CM | POA: Insufficient documentation

## 2016-07-18 NOTE — Therapy (Signed)
[facility] [address] [place], [address] Phone: [phone]   Fax:  [phone]  Physical Therapy Treatment  Patient Details  Name: Alexander Moss MRN: [id number] Date of Birth: [birth year] Referring Provider: Dione Plover. Aluisio  Encounter Date: 07/18/2016      PT End of Session - 07/18/16 1239    Visit Number 9   Number of Visits 17   Date for PT Re-Evaluation 20-Aug-2016   Authorization Type g codes   Authorization Time Period 2/10   PT Start Time 1121   PT Stop Time 1154   PT Time Calculation (min) 33 min   Activity Tolerance Patient tolerated treatment well   Behavior During Therapy WFL for tasks assessed/performed      Past Medical History:  Diagnosis Date  . Arthritis   . GERD (gastroesophageal reflux disease)     Past Surgical History:  Procedure Laterality Date  . [place]  . KNEE SURGERY Bilateral 2003  . TONSILLECTOMY AND ADENOIDECTOMY    . TOTAL KNEE ARTHROPLASTY Right 05/23/2016   Procedure: RIGHT TOTAL KNEE ARTHROPLASTY;  Surgeon: Gaynelle Arabian, MD;  Location: [facility];  Service: Orthopedics;  Laterality: Right;    There were no vitals filed for this visit.      Subjective Assessment - 07/18/16 1237    Subjective Patient reports he has been going up and down ladders, and having less difficulty with ADLs like getting in and out of bed as well as moving around the house. He has been able to resume normal activities and increased his endurance.    Pertinent History Pt is s/p R TKA 05/23/16.  He has orders for WBAT.  Pt reports he has not had any issues ascending/descending steps at home using crutch and a rail, has performed x2 since d/c from the hospital.  He has been ambulating with his RW since d/c and denies any falls.  Pt is retired and enjoys working with [employer] for [employer].  Pt has h/o chronic low back pain.  [name] he injured his back when he was 71 years old when he  was moving concrete and has had pain ever since.  Has tried multiple different interventions including going to a pain clinic. Pt has been sleeping on his L side while wearing his KI.  Has only been wearing KI when sleeping.  Pt has had most difficulty with bending over and donning pants and undergarments as well as bathing and has been having assist from wife for this. Pt was not using AD prior to R TKA and denies any falls in the past 6 months.    Limitations Standing;Walking;House hold activities   Patient Stated Goals To be able to climb a ladder to a roof so he can continue volunteering with [employer]   Currently in Pain? No/denies       Single leg sit to stand (eccentric only x 8 -- progressed to use of HHA bilaterally for concentric and eccentric -- cuing for having his foot further anterior to reduce pressure in patellofemoral joint x 10)   Lunges with HHA on railing x 12 repetitions on RLE with cuing for LLE to be further posterior to his COM,   Step downs from wooden step with minimal HHA x 12 on RLE with cuing for performing slowly. Patient found this easy, added 10# DB x 5 (much more challenging).  Patient reports no further questions or complaints, educated on role of  quadriceps in overall improvements in function and pain relief and how this program is designed to provide functional quadricep strength.                           PT Education - 07/18/16 1238    Education provided Yes   Education Details Importance of improving quadricep strength.    Person(s) Educated Patient   Methods Explanation;Demonstration;Handout   Comprehension Verbalized understanding;Returned demonstration             PT Long Term Goals - 06/23/16 0932      PT LONG TERM GOAL #1   Title Pt will improve R knee flexion AROM by 30 degrees for improved mobility   Baseline 90 deg   Time 2   Period Weeks   Status Achieved     PT LONG TERM GOAL #2   Title Pt will  improve R knee extension strength to at least 4/5 to demonstrate improved strength   Baseline 3-/5   Time 6   Period Weeks   Status Achieved     PT LONG TERM GOAL #3   Title Pt will improve 5xSTS to at least 14.2 seconds to demonstrate improved strength and balance   Baseline 26.81 seconds   Time 8   Period Weeks   Status Achieved     PT LONG TERM GOAL #4   Title Pt will be able to ascend/descend a flight of steps without railings to work toward goal of ascending/descending ladder   Time 6   Period Weeks   Status Achieved               Plan - 07/18/16 1239    Clinical Impression Statement Patient reports he is able to perform most of his ADLs without difficulty. He does have some residual stiffness at times in his knee, but he reports his complaints are minimal at this time. Provided R quadricep functional strengthening, and educated on discharge instructions.    Rehab Potential Good   Clinical Impairments Affecting Rehab Potential (+) Very motivated, (-) h/o chronic low back pain   PT Frequency 2x / week   PT Duration 8 weeks   PT Treatment/Interventions ADLs/Self Care Home Management;Cryotherapy;Electrical Stimulation;Moist Heat;Iontophoresis 4mg /ml Dexamethasone;Ultrasound;Contrast Bath;DME Instruction;Gait training;Stair training;Functional mobility training;Therapeutic activities;Therapeutic exercise;Balance training;Neuromuscular re-education;Patient/family education;Manual techniques;Scar mobilization;Passive range of motion;Compression bandaging;Dry needling;Energy conservation;Taping   PT Next Visit Plan Pt to bring in HEP provided to him at hospital (to review this), 43mWT, gait training, RLE strengthening and ROM program   PT Home Exercise Plan Lunges, step downs, single leg sit to stands with hands.    Consulted and Agree with Plan of Care Patient;Family member/caregiver   Family Member Consulted wife      Patient will benefit from skilled therapeutic  intervention in order to improve the following deficits and impairments:  Abnormal gait, Decreased activity tolerance, Decreased balance, Decreased endurance, Decreased knowledge of use of DME, Decreased range of motion, Decreased safety awareness, Decreased scar mobility, Decreased strength, Difficulty walking, Increased edema, Increased fascial restricitons, Increased muscle spasms, Impaired perceived functional ability, Impaired flexibility, Improper body mechanics, Postural dysfunction, Pain  Visit Diagnosis: Muscle weakness (generalized)  Difficulty in walking, not elsewhere classified  Acute pain of right knee     Problem List Patient Active Problem List   Diagnosis Date Noted  . OA (osteoarthritis) of knee 05/23/2016  . Cough 01/13/2015  . Hearing loss 01/06/2015  . Chronic back pain 12/09/2014  .  GERD (gastroesophageal reflux disease) 12/09/2014  . Obesity 12/09/2014  . PSA Elevation 12/09/2014  . Rosacea 12/09/2014  . Hyperlipidemia, mixed 07/13/2008  . Family history of GI tract cancer 07/09/2008  . Lumbago 06/25/2007  . Diverticulosis of colon without hemorrhage 09/08/2006   Kerman Passey, PT, DPT    07/18/2016, 12:42 PM  [facility] [address] [place], [address] Phone: [phone]   Fax:  [phone]  Name: Alexander Moss MRN: [id number] Date of Birth: 04-29-1946

## 2016-07-21 ENCOUNTER — Encounter: Payer: Medicare Other | Admitting: Physical Therapy

## 2016-08-02 DIAGNOSIS — Z471 Aftercare following joint replacement surgery: Secondary | ICD-10-CM | POA: Diagnosis not present

## 2016-08-02 DIAGNOSIS — Z96651 Presence of right artificial knee joint: Secondary | ICD-10-CM | POA: Diagnosis not present

## 2017-01-27 ENCOUNTER — Encounter: Payer: Self-pay | Admitting: Family Medicine

## 2017-01-27 ENCOUNTER — Telehealth: Payer: Self-pay

## 2017-01-27 ENCOUNTER — Ambulatory Visit (INDEPENDENT_AMBULATORY_CARE_PROVIDER_SITE_OTHER): Payer: Medicare Other | Admitting: Family Medicine

## 2017-01-27 VITALS — BP 124/82 | HR 70 | Temp 98.4°F | Resp 16 | Ht 67.0 in | Wt 228.0 lb

## 2017-01-27 DIAGNOSIS — R739 Hyperglycemia, unspecified: Secondary | ICD-10-CM

## 2017-01-27 DIAGNOSIS — E782 Mixed hyperlipidemia: Secondary | ICD-10-CM

## 2017-01-27 DIAGNOSIS — Z125 Encounter for screening for malignant neoplasm of prostate: Secondary | ICD-10-CM | POA: Diagnosis not present

## 2017-01-27 NOTE — Telephone Encounter (Signed)
This patient had a wellness visit November 2017, can I charge another wellness visit now or do we have to wait until November?

## 2017-01-27 NOTE — Telephone Encounter (Signed)
When patient was leaving today after his visit he said he wanted to make sure that today's visit was coded correctly as Wellness Visit he said last time it was not correct and it took a while to get this fixed. I just advised patient I would pass along the information-aa

## 2017-01-27 NOTE — Progress Notes (Signed)
Patient: Alexander Moss, Male    DOB: [birth year], 71 y.o.   MRN: [id number] Visit Date: 01/27/2017  Today's Provider: Lelon Huh, MD   Chief Complaint  Patient presents with  . Hyperlipidemia   Subjective:    Lipid/Cholesterol, Follow-up:   Last seen for this2 years ago.  Management changes since that visit include no changes. . Last Lipid Panel:    Component Value Date/Time   CHOL 185 12/31/2014 0707   TRIG 87 12/31/2014 0707   HDL 47 12/31/2014 0707   CHOLHDL 3.9 12/31/2014 0707   LDLCALC 121 (H) 12/31/2014 0707    Risk factors for vascular disease include obesity  He reports good compliance with treatment. He is not having side effects.  Current symptoms include none and have been stable. Weight trend: stable Prior visit with dietician: no Current diet: well balanced Current exercise: no regular exercise  Wt Readings from Last 3 Encounters:  01/27/17 228 lb (103.4 kg)  05/23/16 231 lb (104.8 kg)  05/13/16 231 lb (104.8 kg)     Also for follow up hyperglycemia  . BMET    Component Value Date/Time   NA 135 05/24/2016 0353   NA 138 08/06/2013   K 4.3 05/24/2016 0353   CL 104 05/24/2016 0353   CO2 25 05/24/2016 0353   GLUCOSE 152 (H) 05/24/2016 0353   BUN 16 05/24/2016 0353   BUN 22 (A) 08/06/2013   CREATININE 0.99 05/24/2016 0353   CALCIUM 8.5 (L) 05/24/2016 0353   GFRNONAA >60 05/24/2016 0353   GFRAA >60 05/24/2016 0353     Review of Systems  Constitutional: Negative.   HENT: Negative.   Eyes: Negative.   Respiratory: Negative.   Cardiovascular: Negative.   Gastrointestinal: Negative.   Endocrine: Negative.   Genitourinary: Negative.   Musculoskeletal: Negative.   Skin: Negative.   Neurological: Negative.   Hematological: Negative.   Psychiatric/Behavioral: Negative.     Social History   Social History  . Marital status: Married    Spouse name: N/A  . Number of children: 1  . Years of education: [name]    Occupational History  . Retired    Social History Main Topics  . Smoking status: Former Research scientist (life sciences)  . Smokeless tobacco: Never Used     Comment: remote smoking history, quit in 1981  . Alcohol use 0.0 oz/week     Comment: 1/2 beer daily or less  . Drug use: No  . Sexual activity: Not on file   Other Topics Concern  . Not on file   Social History Narrative  . No narrative on file    Past Medical History:  Diagnosis Date  . Arthritis   . GERD (gastroesophageal reflux disease)      Patient Active Problem List   Diagnosis Date Noted  . OA (osteoarthritis) of knee 05/23/2016  . Hearing loss 01/06/2015  . Chronic back pain 12/09/2014  . GERD (gastroesophageal reflux disease) 12/09/2014  . Obesity 12/09/2014  . PSA Elevation 12/09/2014  . Rosacea 12/09/2014  . Hyperlipidemia, mixed 07/13/2008  . Family history of GI tract cancer 07/09/2008  . Lumbago 06/25/2007  . Diverticulosis of colon without hemorrhage 09/08/2006    Past Surgical History:  Procedure Laterality Date  . [place]  . KNEE SURGERY Bilateral 2003  . TONSILLECTOMY AND ADENOIDECTOMY    . TOTAL KNEE ARTHROPLASTY Right 05/23/2016   Procedure: RIGHT TOTAL KNEE ARTHROPLASTY;  Surgeon: Gaynelle Arabian, MD;  Location: [facility];  Service: Orthopedics;  Laterality: Right;    His family history includes Diabetes in his mother.      Current Outpatient Prescriptions:  .  fexofenadine (ALLEGRA) 180 MG tablet, Take 180 mg by mouth daily., Disp: , Rfl:  .  methocarbamol (ROBAXIN) 500 MG tablet, Take 1 tablet (500 mg total) by mouth every 6 (six) hours as needed for muscle spasms. (Patient not taking: Reported on 01/27/2017), Disp: 80 tablet, Rfl: 0 .  oxyCODONE (OXY IR/ROXICODONE) 5 MG immediate release tablet, Take 1-2 tablets (5-10 mg total) by mouth every 3 (three) hours as needed for moderate pain or severe pain. (Patient not taking: Reported on 01/27/2017), Disp: 80 tablet, Rfl: 0 .  rivaroxaban  (XARELTO) 10 MG TABS tablet, Take 1 tablet (10 mg total) by mouth daily with breakfast. Take Xarelto for two and a half more weeks, then discontinue Xarelto. Once the patient has completed the Xarelto, they may resume the 81 mg Aspirin. (Patient not taking: Reported on 05/26/2016), Disp: 20 tablet, Rfl: 0 .  traMADol (ULTRAM) 50 MG tablet, Take 1-2 tablets (50-100 mg total) by mouth every 6 (six) hours as needed (mild pain). (Patient not taking: Reported on 01/27/2017), Disp: 80 tablet, Rfl: 1  Patient Care Team: Birdie Sons, MD as PCP - General (Family Medicine) Gaynelle Arabian, MD as Consulting Physician (Orthopedic Surgery) Pinnix-Bailey, Damaris Schooner, MD as Consulting Physician (Dentistry)     Objective:   Vitals: BP 124/82 (BP Location: Left Arm, Patient Position: Sitting, Cuff Size: Large)   Pulse 70   Temp 98.4 F (36.9 C)   Resp 16   Ht 5\' 7"  (1.702 m)   Wt 228 lb (103.4 kg)   SpO2 95%   BMI 35.71 kg/m   Physical Exam   General Appearance:    Alert, cooperative, no distress  Eyes:    PERRL, conjunctiva/corneas clear, EOM's intact       Lungs:     Clear to auscultation bilaterally, respirations unlabored  Heart:    Regular rate and rhythm  Neurologic:   Awake, alert, oriented x 3. No apparent focal neurological           defect.        Activities of Daily Living In your present state of health, do you have any difficulty performing the following activities: 01/27/2017 05/23/2016  Hearing? Tempie Donning  Vision? Y N  Difficulty concentrating or making decisions? N N  Walking or climbing stairs? N Y  Dressing or bathing? N N  Doing errands, shopping? N N  Preparing Food and eating ? - -  Using the Toilet? - -  In the past six months, have you accidently leaked urine? - -  Do you have problems with loss of bowel control? - -  Managing your Medications? - -  Managing your Finances? - -  Housekeeping or managing your Housekeeping? - -  Some recent data might be hidden    Fall  Risk Assessment Fall Risk  01/27/2017 05/12/2016 05/12/2016 12/26/2014  Falls in the past year? No No No Yes  Number falls in past yr: - - - 2 or more  Injury with Fall? - - - No  Risk for fall due to : - - - History of fall(s)  Risk for fall due to (comments): - - - Usually involves ladders     Depression Screen PHQ 2/9 Scores 01/27/2017 05/12/2016 05/12/2016 12/26/2014  PHQ - 2 Score 0 0 0 0  PHQ- 9 Score 1 - - -  Cognitive Testing - 6-CIT  Correct? Score   What year is it? yes 0 0 or 4  What month is it? yes 0 0 or 3  Memorize:    [name],  40,  [place],      What time is it? (within 1 hour) yes 0 0 or 3  Count backwards from 20 yes 0 0, 2, or 4  Name the months of the year yes 0 0, 2, or 4  Repeat name & address above yes 0 0, 2, 4, 6, 8, or 10       TOTAL SCORE  0/28   Interpretation:  Normal  Normal (0-7) Abnormal (8-28)       Assessment & Plan:    1. Prostate cancer screening  - PSA  2. Hyperglycemia  - Hemoglobin A1c  3. Hyperlipidemia, mixed Diet controlled.  - Lipid panel    Lelon Huh, MD  [facility]

## 2017-01-27 NOTE — Patient Instructions (Addendum)
The CDC recommends two doses of Shingrix separated by 2 to 6 months for adults age 73 years and older. I recommend checking with your insurance plan regarding coverage for this vaccine.    Your are also due for a tetanus vaccine which your pharmacy may be able to provide    Preventive Care 65 Years and Older, Male Preventive care refers to lifestyle choices and visits with your health care provider that can promote health and wellness. What does preventive care include?  A yearly physical exam. This is also called an annual well check.  Dental exams once or twice a year.  Routine eye exams. Ask your health care provider how often you should have your eyes checked.  Personal lifestyle choices, including: ? Daily care of your teeth and gums. ? Regular physical activity. ? Eating a healthy diet. ? Avoiding tobacco and drug use. ? Limiting alcohol use. ? Practicing safe sex. ? Taking low doses of aspirin every day. ? Taking vitamin and mineral supplements as recommended by your health care provider. What happens during an annual well check? The services and screenings done by your health care provider during your annual well check will depend on your age, overall health, lifestyle risk factors, and family history of disease. Counseling Your health care provider may ask you questions about your:  Alcohol use.  Tobacco use.  Drug use.  Emotional well-being.  Home and relationship well-being.  Sexual activity.  Eating habits.  History of falls.  Memory and ability to understand (cognition).  Work and work Statistician.  Screening You may have the following tests or measurements:  Height, weight, and BMI.  Blood pressure.  Lipid and cholesterol levels. These may be checked every 5 years, or more frequently if you are over 44 years old.  Skin check.  Lung cancer screening. You may have this screening every year starting at age 61 if you have a 30-pack-year  history of smoking and currently smoke or have quit within the past 15 years.  Fecal occult blood test (FOBT) of the stool. You may have this test every year starting at age 47.  Flexible sigmoidoscopy or colonoscopy. You may have a sigmoidoscopy every 5 years or a colonoscopy every 10 years starting at age 85.  Prostate cancer screening. Recommendations will vary depending on your family history and other risks.  Hepatitis C blood test.  Hepatitis B blood test.  Sexually transmitted disease (STD) testing.  Diabetes screening. This is done by checking your blood sugar (glucose) after you have not eaten for a while (fasting). You may have this done every 1-3 years.  Abdominal aortic aneurysm (AAA) screening. You may need this if you are a current or former smoker.  Osteoporosis. You may be screened starting at age 38 if you are at high risk.  Talk with your health care provider about your test results, treatment options, and if necessary, the need for more tests. Vaccines Your health care provider may recommend certain vaccines, such as:  Influenza vaccine. This is recommended every year.  Tetanus, diphtheria, and acellular pertussis (Tdap, Td) vaccine. You may need a Td booster every 10 years.  Varicella vaccine. You may need this if you have not been vaccinated.  Zoster vaccine. You may need this after age 21.  Measles, mumps, and rubella (MMR) vaccine. You may need at least one dose of MMR if you were born in 1957 or later. You may also need a second dose.  Pneumococcal 13-valent conjugate (PCV13)  vaccine. One dose is recommended after age 57.  Pneumococcal polysaccharide (PPSV23) vaccine. One dose is recommended after age 53.  Meningococcal vaccine. You may need this if you have certain conditions.  Hepatitis A vaccine. You may need this if you have certain conditions or if you travel or work in places where you may be exposed to hepatitis A.  Hepatitis B vaccine. You may  need this if you have certain conditions or if you travel or work in places where you may be exposed to hepatitis B.  Haemophilus influenzae type b (Hib) vaccine. You may need this if you have certain risk factors.  Talk to your health care provider about which screenings and vaccines you need and how often you need them. This information is not intended to replace advice given to you by your health care provider. Make sure you discuss any questions you have with your health care provider. Document Released: 07/24/2015 Document Revised: 03/16/2016 Document Reviewed: 04/28/2015 Elsevier Interactive Patient Education  2017 [employer].

## 2017-01-31 ENCOUNTER — Other Ambulatory Visit: Payer: Self-pay | Admitting: Family Medicine

## 2017-01-31 DIAGNOSIS — E782 Mixed hyperlipidemia: Secondary | ICD-10-CM | POA: Diagnosis not present

## 2017-01-31 DIAGNOSIS — Z125 Encounter for screening for malignant neoplasm of prostate: Secondary | ICD-10-CM | POA: Diagnosis not present

## 2017-01-31 DIAGNOSIS — R739 Hyperglycemia, unspecified: Secondary | ICD-10-CM | POA: Diagnosis not present

## 2017-02-03 LAB — LIPID PANEL
CHOLESTEROL TOTAL: 157 mg/dL (ref 100–199)
Chol/HDL Ratio: 3 ratio (ref 0.0–5.0)
HDL: 53 mg/dL (ref >39)
LDL Calculated: 92 mg/dL (ref 0–99)
Triglycerides: 60 mg/dL (ref 0–149)
VLDL Cholesterol Cal: 12 mg/dL (ref 5–40)

## 2017-02-03 LAB — HEMOGLOBIN A1C
ESTIMATED AVERAGE GLUCOSE: 111 mg/dL
Hgb A1c MFr Bld: 5.5 % (ref 4.8–5.6)

## 2017-02-03 LAB — PSA: PROSTATE SPECIFIC AG, SERUM: 4.6 ng/mL — AB (ref 0.0–4.0)

## 2017-02-07 ENCOUNTER — Telehealth: Payer: Self-pay

## 2017-02-07 NOTE — Telephone Encounter (Signed)
-----   Message from Birdie Sons, MD sent at 02/07/2017 12:45 PM EDT ----- Cholesterol is good at 137. Normal blood sugar. Slightly elevated PSA, but stable at 4.6. Check yearly.

## 2017-02-07 NOTE — Telephone Encounter (Signed)
Pt advised.

## 2017-02-14 ENCOUNTER — Telehealth: Payer: Self-pay | Admitting: Family Medicine

## 2017-02-14 NOTE — Telephone Encounter (Signed)
Pt is requesting a call back.  Pt has a question about his cholesterol results.  PQ#[phone]/RT

## 2017-02-14 NOTE — Telephone Encounter (Signed)
Patient advised of lab results

## 2017-05-01 ENCOUNTER — Telehealth: Payer: Self-pay | Admitting: Family Medicine

## 2017-05-03 ENCOUNTER — Telehealth: Payer: Self-pay | Admitting: Family Medicine

## 2017-05-08 DIAGNOSIS — Z23 Encounter for immunization: Secondary | ICD-10-CM | POA: Diagnosis not present

## 2017-05-25 DIAGNOSIS — Z96651 Presence of right artificial knee joint: Secondary | ICD-10-CM | POA: Diagnosis not present

## 2017-05-25 DIAGNOSIS — Z471 Aftercare following joint replacement surgery: Secondary | ICD-10-CM | POA: Diagnosis not present

## 2017-05-25 DIAGNOSIS — M170 Bilateral primary osteoarthritis of knee: Secondary | ICD-10-CM | POA: Diagnosis not present

## 2017-06-28 NOTE — Telephone Encounter (Signed)
duplicate

## 2017-10-12 NOTE — Telephone Encounter (Signed)
complete

## 2017-11-06 ENCOUNTER — Telehealth: Payer: Self-pay | Admitting: Family Medicine

## 2017-11-06 NOTE — Telephone Encounter (Signed)
Left message for patient to return call for an appt.

## 2017-12-27 ENCOUNTER — Ambulatory Visit (INDEPENDENT_AMBULATORY_CARE_PROVIDER_SITE_OTHER): Payer: Medicare Other

## 2017-12-27 ENCOUNTER — Ambulatory Visit (INDEPENDENT_AMBULATORY_CARE_PROVIDER_SITE_OTHER): Payer: Medicare Other | Admitting: Family Medicine

## 2017-12-27 VITALS — BP 114/66 | HR 67 | Temp 99.2°F | Ht 67.0 in | Wt 210.8 lb

## 2017-12-27 DIAGNOSIS — Z0000 Encounter for general adult medical examination without abnormal findings: Secondary | ICD-10-CM

## 2017-12-27 DIAGNOSIS — Z1159 Encounter for screening for other viral diseases: Secondary | ICD-10-CM

## 2017-12-27 DIAGNOSIS — R351 Nocturia: Secondary | ICD-10-CM | POA: Diagnosis not present

## 2017-12-27 DIAGNOSIS — R739 Hyperglycemia, unspecified: Secondary | ICD-10-CM | POA: Diagnosis not present

## 2017-12-27 DIAGNOSIS — Z125 Encounter for screening for malignant neoplasm of prostate: Secondary | ICD-10-CM | POA: Diagnosis not present

## 2017-12-27 MED ORDER — TAMSULOSIN HCL 0.4 MG PO CAPS: 0.4000 mg | ORAL_CAPSULE | Freq: Every day | ORAL | 3 refills | Status: DC

## 2017-12-27 NOTE — Progress Notes (Signed)
Patient: Alexander Moss Male    DOB: 03-05-1946   72 y.o.   MRN: [id number] Visit Date: 12/27/2017  Today's Provider: Lelon Huh, MD   Chief Complaint  Patient presents with  . Follow-up  . Hyperlipidemia  . Hyperglycemia   Subjective:   Patient saw  [name] for AWV today at 2:30 pm.  HPI    Lipid/Cholesterol, Follow-up:   Last seen for this 11 months ago.  Management since that visit includes; labs checked, no changes.  Last Lipid Panel:    Component Value Date/Time   CHOL 157 01/31/2017 0000   TRIG 60 01/31/2017 0000   HDL 53 01/31/2017 0000   CHOLHDL 3.0 01/31/2017 0000   LDLCALC 92 01/31/2017 0000    He reports good compliance with treatment. He is not having side effects. none  Wt Readings from Last 3 Encounters:  12/27/17 210 lb 12.8 oz (95.6 kg)  01/27/17 228 lb (103.4 kg)  05/23/16 231 lb (104.8 kg)    -----------------------------------------------------------------  Hyperglycemia From 01/27/2017-labs checked, no changes. Hemoglobin A1c 5.5.  Previous fasint glucoses were 152 and 114.  He also complains of having to get up at night to void 4-5 times a night for about 6 months with urinary hesitancy. Doesn't notice it as much during the day. No burning or stinging with urination.     No Known Allergies   Current Outpatient Medications:  .  Cholecalciferol (VITAMIN D-1000 MAX ST) 1000 units tablet, Take 1,000 Units by mouth daily., Disp: , Rfl:  .  fexofenadine (ALLEGRA) 180 MG tablet, Take 180 mg by mouth daily., Disp: , Rfl:  .  Loratadine (CLARITIN) 10 MG CAPS, Take by mouth. , Disp: , Rfl:  .  naproxen sodium (ALEVE) 220 MG tablet, Take 220 mg by mouth 2 (two) times daily as needed. , Disp: , Rfl:  .  Omega-3 Fatty Acids (FISH OIL) 1000 MG CAPS, Take 1,000 mg by mouth daily. , Disp: , Rfl:  .  Vitamin E 400 units TABS, Take 1 tablet by mouth daily., Disp: , Rfl:  .  Zinc 25 MG TABS, Take by mouth daily., Disp: , Rfl:   Review of  Systems  HENT: Positive for congestion.   Genitourinary: Positive for frequency.  Musculoskeletal: Positive for arthralgias.  All other systems reviewed and are negative.   Social History   Tobacco Use  . Smoking status: Former Research scientist (life sciences)  . Smokeless tobacco: Never Used  . Tobacco comment: remote smoking history, quit in 1981  Substance Use Topics  . Alcohol use: Yes    Alcohol/week: 0.6 - 1.8 oz    Types: 1 - 3 Standard drinks or equivalent per week    Comment: beer or wine   Objective:   Vitals:   BP 114/66 (BP Location: Right Arm)   Pulse 67   Temp 99.2 F (37.3 C) (Oral)   Ht 5\' 7"  (1.702 m)   Wt 210 lb 12.8 oz (95.6 kg)   BMI 33.02 kg/m   BSA 2.13 m      Physical Exam   General Appearance:    Alert, cooperative, no distress  Eyes:    PERRL, conjunctiva/corneas clear, EOM's intact       Lungs:     Clear to auscultation bilaterally, respirations unlabored  Heart:    Regular rate and rhythm  Neurologic:   Awake, alert, oriented x 3. No apparent focal neurological           defect.  GU:   30 gram prostate, symmetrical, no nodules.        Assessment & Plan:     1. Hyperglycemia  - Comprehensive metabolic panel - Hemoglobin A1c  2. Nocturia  - PSA Total (Reflex To Free)  3. Prostate cancer screening  - PSA Total (Reflex To Free)       Lelon Huh, MD  [facility]

## 2017-12-27 NOTE — Patient Instructions (Addendum)
[name] , Thank you for taking time to come for your Medicare Wellness Visit. I appreciate your ongoing commitment to your health goals. Please review the following plan we discussed and let me know if I can assist you in the future.   Screening recommendations/referrals: Colonoscopy: Up to date Recommended yearly ophthalmology/optometry visit for glaucoma screening and checkup Recommended yearly dental visit for hygiene and checkup  Vaccinations: Influenza vaccine: Up to date Pneumococcal vaccine: Up to date Tdap vaccine: Pt declines today.  Shingles vaccine: Pt declines today.     Advanced directives: Please bring a copy of your POA (Power of Attorney) and/or Living Will to your next appointment.   Conditions/risks identified: Obesity- recommend to continue cutting back on sugars in daily diet to help aid in weight loss.   Next appointment: 3 PM today with Dr Caryn Section.   Preventive Care 74 Years and Older, Male Preventive care refers to lifestyle choices and visits with your health care provider that can promote health and wellness. What does preventive care include?  A yearly physical exam. This is also called an annual well check.  Dental exams once or twice a year.  Routine eye exams. Ask your health care provider how often you should have your eyes checked.  Personal lifestyle choices, including:  Daily care of your teeth and gums.  Regular physical activity.  Eating a healthy diet.  Avoiding tobacco and drug use.  Limiting alcohol use.  Practicing safe sex.  Taking low doses of aspirin every day.  Taking vitamin and mineral supplements as recommended by your health care provider. What happens during an annual well check? The services and screenings done by your health care provider during your annual well check will depend on your age, overall health, lifestyle risk factors, and family history of disease. Counseling  Your health care provider may ask you  questions about your:  Alcohol use.  Tobacco use.  Drug use.  Emotional well-being.  Home and relationship well-being.  Sexual activity.  Eating habits.  History of falls.  Memory and ability to understand (cognition).  Work and work Statistician. Screening  You may have the following tests or measurements:  Height, weight, and BMI.  Blood pressure.  Lipid and cholesterol levels. These may be checked every 5 years, or more frequently if you are over 63 years old.  Skin check.  Lung cancer screening. You may have this screening every year starting at age 34 if you have a 30-pack-year history of smoking and currently smoke or have quit within the past 15 years.  Fecal occult blood test (FOBT) of the stool. You may have this test every year starting at age 3.  Flexible sigmoidoscopy or colonoscopy. You may have a sigmoidoscopy every 5 years or a colonoscopy every 10 years starting at age 75.  Prostate cancer screening. Recommendations will vary depending on your family history and other risks.  Hepatitis C blood test.  Hepatitis B blood test.  Sexually transmitted disease (STD) testing.  Diabetes screening. This is done by checking your blood sugar (glucose) after you have not eaten for a while (fasting). You may have this done every 1-3 years.  Abdominal aortic aneurysm (AAA) screening. You may need this if you are a current or former smoker.  Osteoporosis. You may be screened starting at age 80 if you are at high risk. Talk with your health care provider about your test results, treatment options, and if necessary, the need for more tests. Vaccines  Your health  care provider may recommend certain vaccines, such as:  Influenza vaccine. This is recommended every year.  Tetanus, diphtheria, and acellular pertussis (Tdap, Td) vaccine. You may need a Td booster every 10 years.  Zoster vaccine. You may need this after age 35.  Pneumococcal 13-valent conjugate  (PCV13) vaccine. One dose is recommended after age 25.  Pneumococcal polysaccharide (PPSV23) vaccine. One dose is recommended after age 42. Talk to your health care provider about which screenings and vaccines you need and how often you need them. This information is not intended to replace advice given to you by your health care provider. Make sure you discuss any questions you have with your health care provider. Document Released: 07/24/2015 Document Revised: 03/16/2016 Document Reviewed: 04/28/2015 Elsevier Interactive Patient Education  2017 [place] Prevention in the Home Falls can cause injuries. They can happen to people of all ages. There are many things you can do to make your home safe and to help prevent falls. What can I do on the outside of my home?  Regularly fix the edges of walkways and driveways and fix any cracks.  Remove anything that might make you trip as you walk through a door, such as a raised step or threshold.  Trim any bushes or trees on the path to your home.  Use bright outdoor lighting.  Clear any walking paths of anything that might make someone trip, such as rocks or tools.  Regularly check to see if handrails are loose or broken. Make sure that both sides of any steps have handrails.  Any raised decks and porches should have guardrails on the edges.  Have any leaves, snow, or ice cleared regularly.  Use sand or salt on walking paths during winter.  Clean up any spills in your garage right away. This includes oil or grease spills. What can I do in the bathroom?  Use night lights.  Install grab bars by the toilet and in the tub and shower. Do not use towel bars as grab bars.  Use non-skid mats or decals in the tub or shower.  If you need to sit down in the shower, use a plastic, non-slip stool.  Keep the floor dry. Clean up any water that spills on the floor as soon as it happens.  Remove soap buildup in the tub or shower  regularly.  Attach bath mats securely with double-sided non-slip rug tape.  Do not have throw rugs and other things on the floor that can make you trip. What can I do in the bedroom?  Use night lights.  Make sure that you have a light by your bed that is easy to reach.  Do not use any sheets or blankets that are too big for your bed. They should not hang down onto the floor.  Have a firm chair that has side arms. You can use this for support while you get dressed.  Do not have throw rugs and other things on the floor that can make you trip. What can I do in the kitchen?  Clean up any spills right away.  Avoid walking on wet floors.  Keep items that you use a lot in easy-to-reach places.  If you need to reach something above you, use a strong step stool that has a grab bar.  Keep electrical cords out of the way.  Do not use floor polish or wax that makes floors slippery. If you must use wax, use non-skid floor wax.  Do not have  throw rugs and other things on the floor that can make you trip. What can I do with my stairs?  Do not leave any items on the stairs.  Make sure that there are handrails on both sides of the stairs and use them. Fix handrails that are broken or loose. Make sure that handrails are as long as the stairways.  Check any carpeting to make sure that it is firmly attached to the stairs. Fix any carpet that is loose or worn.  Avoid having throw rugs at the top or bottom of the stairs. If you do have throw rugs, attach them to the floor with carpet tape.  Make sure that you have a light switch at the top of the stairs and the bottom of the stairs. If you do not have them, ask someone to add them for you. What else can I do to help prevent falls?  Wear shoes that:  Do not have high heels.  Have rubber bottoms.  Are comfortable and fit you well.  Are closed at the toe. Do not wear sandals.  If you use a stepladder:  Make sure that it is fully  opened. Do not climb a closed stepladder.  Make sure that both sides of the stepladder are locked into place.  Ask someone to hold it for you, if possible.  Clearly mark and make sure that you can see:  Any grab bars or handrails.  First and last steps.  Where the edge of each step is.  Use tools that help you move around (mobility aids) if they are needed. These include:  Canes.  Walkers.  Scooters.  Crutches.  Turn on the lights when you go into a dark area. Replace any light bulbs as soon as they burn out.  Set up your furniture so you have a clear path. Avoid moving your furniture around.  If any of your floors are uneven, fix them.  If there are any pets around you, be aware of where they are.  Review your medicines with your doctor. Some medicines can make you feel dizzy. This can increase your chance of falling. Ask your doctor what other things that you can do to help prevent falls. This information is not intended to replace advice given to you by your health care provider. Make sure you discuss any questions you have with your health care provider. Document Released: 04/23/2009 Document Revised: 12/03/2015 Document Reviewed: 08/01/2014 Elsevier Interactive Patient Education  2017 [employer].

## 2017-12-27 NOTE — Patient Instructions (Signed)
 The CDC recommends two doses of Shingrix (the shingles vaccine) separated by 2 to 6 months for adults age 74 years and older. I recommend checking with your insurance plan regarding coverage for this vaccine.    Preventive Care 65 Years and Older, Male Preventive care refers to lifestyle choices and visits with your health care provider that can promote health and wellness. What does preventive care include?  A yearly physical exam. This is also called an annual well check.  Dental exams once or twice a year.  Routine eye exams. Ask your health care provider how often you should have your eyes checked.  Personal lifestyle choices, including: ? Daily care of your teeth and gums. ? Regular physical activity. ? Eating a healthy diet. ? Avoiding tobacco and drug use. ? Limiting alcohol use. ? Practicing safe sex. ? Taking low doses of aspirin every day. ? Taking vitamin and mineral supplements as recommended by your health care provider. What happens during an annual well check? The services and screenings done by your health care provider during your annual well check will depend on your age, overall health, lifestyle risk factors, and family history of disease. Counseling Your health care provider may ask you questions about your:  Alcohol use.  Tobacco use.  Drug use.  Emotional well-being.  Home and relationship well-being.  Sexual activity.  Eating habits.  History of falls.  Memory and ability to understand (cognition).  Work and work environment.  Screening You may have the following tests or measurements:  Height, weight, and BMI.  Blood pressure.  Lipid and cholesterol levels. These may be checked every 5 years, or more frequently if you are over 74 years old.  Skin check.  Lung cancer screening. You may have this screening every year starting at age 55 if you have a 30-pack-year history of smoking and currently smoke or have quit within the past 15  years.  Fecal occult blood test (FOBT) of the stool. You may have this test every year starting at age 74.  Flexible sigmoidoscopy or colonoscopy. You may have a sigmoidoscopy every 5 years or a colonoscopy every 10 years starting at age 74.  Prostate cancer screening. Recommendations will vary depending on your family history and other risks.  Hepatitis C blood test.  Hepatitis B blood test.  Sexually transmitted disease (STD) testing.  Diabetes screening. This is done by checking your blood sugar (glucose) after you have not eaten for a while (fasting). You may have this done every 1-3 years.  Abdominal aortic aneurysm (AAA) screening. You may need this if you are a current or former smoker.  Osteoporosis. You may be screened starting at age 70 if you are at high risk.  Talk with your health care provider about your test results, treatment options, and if necessary, the need for more tests. Vaccines Your health care provider may recommend certain vaccines, such as:  Influenza vaccine. This is recommended every year.  Tetanus, diphtheria, and acellular pertussis (Tdap, Td) vaccine. You may need a Td booster every 10 years.  Varicella vaccine. You may need this if you have not been vaccinated.  Zoster vaccine. You may need this after age 60.  Measles, mumps, and rubella (MMR) vaccine. You may need at least one dose of MMR if you were born in 1957 or later. You may also need a second dose.  Pneumococcal 13-valent conjugate (PCV13) vaccine. One dose is recommended after age 65.  Pneumococcal polysaccharide (PPSV23) vaccine. One dose is   is recommended after age 65.  Meningococcal vaccine. You may need this if you have certain conditions.  Hepatitis A vaccine. You may need this if you have certain conditions or if you travel or work in places where you may be exposed to hepatitis A.  Hepatitis B vaccine. You may need this if you have certain conditions or if you travel or work in  places where you may be exposed to hepatitis B.  Haemophilus influenzae type b (Hib) vaccine. You may need this if you have certain risk factors.  Talk to your health care provider about which screenings and vaccines you need and how often you need them. This information is not intended to replace advice given to you by your health care provider. Make sure you discuss any questions you have with your health care provider. Document Released: 07/24/2015 Document Revised: 03/16/2016 Document Reviewed: 04/28/2015 Elsevier Interactive Patient Education  2018 [employer]  

## 2017-12-27 NOTE — Progress Notes (Signed)
Subjective:   Alexander Moss is a 72 y.o. male who presents for Medicare Annual/Subsequent preventive examination.  Review of Systems:  N/A  Cardiac Risk Factors include: advanced age (>71men, >15 women)     Objective:    Vitals: BP 114/66 (BP Location: Right Arm)   Pulse 67   Temp 99.2 F (37.3 C) (Oral)   Ht 5\' 7"  (1.702 m)   Wt 210 lb 12.8 oz (95.6 kg)   BMI 33.02 kg/m   Body mass index is 33.02 kg/m.  Advanced Directives 12/27/2017 05/26/2016 05/23/2016 05/13/2016 05/12/2016  Does Patient Have a Medical Advance Directive? Yes Yes Yes Yes Yes  Type of Paramedic of [place];Living will [place];Living will [place];Living will [place];Living will Living will;Healthcare Power of Attorney  Does patient want to make changes to medical advance directive? - Yes - information given No - Patient declined No - Patient declined -  Copy of [place] in Chart? No - copy requested - No - copy requested No - copy requested No - copy requested    Tobacco Social History   Tobacco Use  Smoking Status Former Smoker  Smokeless Tobacco Never Used  Tobacco Comment   remote smoking history, quit in 1981     Counseling given: Not Answered Comment: remote smoking history, quit in 1981   Clinical Intake:  Pre-visit preparation completed: Yes  Pain : No/denies pain Pain Score: 0-No pain     Nutritional Status: BMI > 30  Obese Nutritional Risks: None Diabetes: No  How often do you need to have someone help you when you read instructions, pamphlets, or other written materials from your doctor or pharmacy?: 1 - Never  Interpreter Needed?: No  Information entered by :: [facility], LPN  Past Medical History:  Diagnosis Date  . Arthritis   . GERD (gastroesophageal reflux disease)    Past Surgical History:  Procedure Laterality Date  . [place]  . KNEE  SURGERY Bilateral 2003  . TONSILLECTOMY AND ADENOIDECTOMY    . TOTAL KNEE ARTHROPLASTY Right 05/23/2016   Procedure: RIGHT TOTAL KNEE ARTHROPLASTY;  Surgeon: Gaynelle Arabian, MD;  Location: [facility];  Service: Orthopedics;  Laterality: Right;   Family History  Problem Relation Age of Onset  . Diabetes Mother    Social History   Socioeconomic History  . Marital status: Married    Spouse name: Not on file  . Number of children: 1  . Years of education: [name]  . Highest education level: Bachelor's degree (e.g., BA, AB, BS)  Occupational History  . Occupation: Retired  Scientific laboratory technician  . Financial resource strain: Not hard at all  . Food insecurity:    Worry: Never true    Inability: Never true  . Transportation needs:    Medical: No    Non-medical: No  Tobacco Use  . Smoking status: Former Research scientist (life sciences)  . Smokeless tobacco: Never Used  . Tobacco comment: remote smoking history, quit in 1981  Substance and Sexual Activity  . Alcohol use: Yes    Alcohol/week: 0.6 - 1.8 oz    Types: 1 - 3 Standard drinks or equivalent per week    Comment: beer or wine  . Drug use: No  . Sexual activity: Not on file  Lifestyle  . Physical activity:    Days per week: Not on file    Minutes per session: Not on file  . Stress: Not  at all  Relationships  . Social connections:    Talks on phone: Not on file    Gets together: Not on file    Attends religious service: Not on file    Active member of club or organization: Not on file    Attends meetings of clubs or organizations: Not on file    Relationship status: Not on file  Other Topics Concern  . Not on file  Social History Narrative  . Not on file    Outpatient Encounter Medications as of 12/27/2017  Medication Sig  . Cholecalciferol (VITAMIN D-1000 MAX ST) 1000 units tablet Take 1,000 Units by mouth daily.  . fexofenadine (ALLEGRA) 180 MG tablet Take 180 mg by mouth daily.  . Loratadine (CLARITIN) 10 MG CAPS Take by mouth.   . naproxen  sodium (ALEVE) 220 MG tablet Take 220 mg by mouth 2 (two) times daily as needed.   . Omega-3 Fatty Acids (FISH OIL) 1000 MG CAPS Take 1,000 mg by mouth daily.   . Vitamin E 400 units TABS Take 1 tablet by mouth daily.  . Zinc 25 MG TABS Take by mouth daily.   No facility-administered encounter medications on file as of 12/27/2017.     Activities of Daily Living In your present state of health, do you have any difficulty performing the following activities: 12/27/2017 01/27/2017  Hearing? Tempie Donning  Comment Wears bilateral hearing aids. -  Vision? N Y  Difficulty concentrating or making decisions? N N  Walking or climbing stairs? N N  Dressing or bathing? N N  Doing errands, shopping? N N  Preparing Food and eating ? N -  Using the Toilet? N -  In the past six months, have you accidently leaked urine? N -  Do you have problems with loss of bowel control? N -  Managing your Medications? N -  Managing your Finances? N -  Housekeeping or managing your Housekeeping? N -  Some recent data might be hidden    Patient Care Team: Birdie Sons, MD as PCP - General (Family Medicine) Gaynelle Arabian, MD as Consulting Physician (Orthopedic Surgery) Pinnix-Bailey, Damaris Schooner, MD as Consulting Physician (Dentistry)   Assessment:   This is a routine wellness examination for [place].  Exercise Activities and Dietary recommendations Current Exercise Habits: The patient does not participate in regular exercise at present(Builds houses and maintains yard. ), Exercise limited by: None identified  Goals    . DIET - REDUCE SUGAR INTAKE     Recommend to continue cutting back on sugars in daily diet to help aid in weight loss.        Fall Risk Fall Risk  12/27/2017 01/27/2017 05/12/2016 05/12/2016 12/26/2014  Falls in the past year? No No No No Yes  Number falls in past yr: - - - - 2 or more  Injury with Fall? - - - - No  Risk for fall due to : - - - - History of fall(s)  Risk for fall due to: Comment - -  - - Usually involves ladders   Is the patient's home free of loose throw rugs in walkways, pet beds, electrical cords, etc?   yes      Grab bars in the bathroom? yes      Handrails on the stairs?   yes      Adequate lighting?   yes  Timed Get Up and Go Performed: N/A   Depression Screen PHQ 2/9 Scores 12/27/2017 01/27/2017 05/12/2016 05/12/2016  PHQ -  2 Score 0 0 0 0  PHQ- 9 Score - 1 - -    Cognitive Function     6CIT Screen 12/27/2017 05/12/2016  What Year? 0 points 0 points  What month? 0 points 0 points  What time? 0 points 0 points  Count back from 20 0 points 0 points  Months in reverse 0 points 0 points  Repeat phrase 0 points 0 points  Total Score 0 0    Immunization History  Administered Date(s) Administered  . Influenza,inj,Quad PF,6+ Mos 04/10/2013  . Influenza-Unspecified 05/08/2017  . Pneumococcal Conjugate-13 12/26/2014  . Pneumococcal Polysaccharide-23 07/29/2009  . Td 07/11/2006  . Zoster 03/05/2009    Qualifies for Shingles Vaccine? Due for Shingles vaccine. Declined my offer to administer today. Education has been provided regarding the importance of this vaccine. Pt has been advised to call her insurance company to determine her out of pocket expense. Advised she may also receive this vaccine at her local pharmacy or Health Dept. Verbalized acceptance and understanding.  Screening Tests Health Maintenance  Topic Date Due  . Hepatitis C Screening  May 16, 1946  . TETANUS/TDAP  07/11/2016  . INFLUENZA VACCINE  02/08/2018  . COLONOSCOPY  02/11/2024  . PNA vac Low Risk Adult  Completed   Cancer Screenings: Lung: Low Dose CT Chest recommended if Age 72-80 years, 30 pack-year currently smoking OR have quit w/in 15years. Patient does not qualify. Colorectal: Up to date  Additional Screenings:  Hepatitis C Screening: ordered and obtained today      Plan:  I have personally reviewed and addressed the Medicare Annual Wellness questionnaire and have noted  the following in the patient's chart:  A. Medical and social history B. Use of alcohol, tobacco or illicit drugs  C. Current medications and supplements D. Functional ability and status E.  Nutritional status F.  Physical activity G. Advance directives H. List of other physicians I.  Hospitalizations, surgeries, and ER visits in previous 12 months J.  [place] such as hearing and vision if needed, cognitive and depression L. Referrals and appointments - none  In addition, I have reviewed and discussed with patient certain preventive protocols, quality metrics, and best practice recommendations. A written personalized care plan for preventive services as well as general preventive health recommendations were provided to patient.  See attached scanned questionnaire for additional information.   Signed,  Fabio Neighbors, LPN Nurse Health Advisor   Nurse Recommendations: Pt declined the tetanus vaccine today.

## 2017-12-28 ENCOUNTER — Telehealth: Payer: Self-pay

## 2017-12-28 LAB — COMPREHENSIVE METABOLIC PANEL
ALT: 17 IU/L (ref 0–44)
AST: 21 IU/L (ref 0–40)
Albumin/Globulin Ratio: 1.5 (ref 1.2–2.2)
Albumin: 4.3 g/dL (ref 3.5–4.8)
Alkaline Phosphatase: 72 IU/L (ref 39–117)
BUN/Creatinine Ratio: 22 (ref 10–24)
BUN: 26 mg/dL (ref 8–27)
Bilirubin Total: 0.7 mg/dL (ref 0.0–1.2)
CALCIUM: 10.2 mg/dL (ref 8.6–10.2)
CO2: 24 mmol/L (ref 20–29)
CREATININE: 1.16 mg/dL (ref 0.76–1.27)
Chloride: 101 mmol/L (ref 96–106)
GFR calc Af Amer: 71 mL/min/{1.73_m2} (ref >59)
GFR, EST NON AFRICAN AMERICAN: 62 mL/min/{1.73_m2} (ref >59)
GLOBULIN, TOTAL: 2.9 g/dL (ref 1.5–4.5)
Glucose: 85 mg/dL (ref 65–99)
Potassium: 4.1 mmol/L (ref 3.5–5.2)
Sodium: 139 mmol/L (ref 134–144)
Total Protein: 7.2 g/dL (ref 6.0–8.5)

## 2017-12-28 LAB — HEMOGLOBIN A1C
ESTIMATED AVERAGE GLUCOSE: 111 mg/dL
Hgb A1c MFr Bld: 5.5 % (ref 4.8–5.6)

## 2017-12-28 LAB — HEPATITIS C ANTIBODY: Hep C Virus Ab: <0.1 s/co ratio (ref 0.0–0.9)

## 2017-12-28 LAB — FPSA% REFLEX
% FREE PSA: 17.3 %
PSA, FREE: 0.97 ng/mL

## 2017-12-28 LAB — PSA TOTAL (REFLEX TO FREE): Prostate Specific Ag, Serum: 5.6 ng/mL — ABNORMAL HIGH (ref 0.0–4.0)

## 2017-12-28 NOTE — Telephone Encounter (Signed)
Patient's wife advised as below. DPR checked

## 2017-12-28 NOTE — Telephone Encounter (Signed)
-----   Message from Birdie Sons, MD sent at 12/28/2017  7:45 AM EDT ----- PSA is up a bit from 4.6 last year to 5.6 now. Rest of labs are normal. Go ahead and start prescription for Flomax. Recheck PSA in 3 months (September) will contact patient when time to recheck it.

## 2018-03-01 ENCOUNTER — Other Ambulatory Visit: Payer: Self-pay | Admitting: Family Medicine

## 2018-03-01 DIAGNOSIS — R351 Nocturia: Secondary | ICD-10-CM

## 2018-03-02 DIAGNOSIS — M19232 Secondary osteoarthritis, left wrist: Secondary | ICD-10-CM | POA: Diagnosis not present

## 2018-03-14 ENCOUNTER — Encounter: Payer: Self-pay | Admitting: Family Medicine

## 2018-03-27 ENCOUNTER — Encounter: Payer: Self-pay | Admitting: Family Medicine

## 2018-03-27 ENCOUNTER — Ambulatory Visit (INDEPENDENT_AMBULATORY_CARE_PROVIDER_SITE_OTHER): Payer: Medicare Other | Admitting: Family Medicine

## 2018-03-27 VITALS — BP 114/74 | HR 58 | Temp 97.7°F | Resp 16 | Wt 215.0 lb

## 2018-03-27 DIAGNOSIS — R972 Elevated prostate specific antigen [PSA]: Secondary | ICD-10-CM | POA: Diagnosis not present

## 2018-03-27 DIAGNOSIS — K4090 Unilateral inguinal hernia, without obstruction or gangrene, not specified as recurrent: Secondary | ICD-10-CM

## 2018-03-27 DIAGNOSIS — R351 Nocturia: Secondary | ICD-10-CM

## 2018-03-27 MED ORDER — TAMSULOSIN HCL 0.4 MG PO CAPS: ORAL_CAPSULE | ORAL | 4 refills | Status: DC

## 2018-03-27 NOTE — Progress Notes (Signed)
Patient: Alexander Moss Male    DOB: 28-Jul-1945   72 y.o.   MRN: [id number] Visit Date: 03/27/2018  Today's Provider: Lelon Huh, MD   Chief Complaint  Patient presents with  . Follow-up   Subjective:    HPI  Follow up for PSA  The patient was last seen for this 3 months ago. Changes made at last visit include check labs and start Flomax.  He reports excellent compliance with treatment. He feels that condition is Improved. He is not having side effects.   He states he is now able to go 4-5 hours during the day without having to void, and usually only having to get up once at night. Does get a little light headed when standing up quickly since starting tamsulosin.  ------------------------------------------------------------------------------------  Noticed swelling on right side of scrotum several months ago which he has been able to manually reduce. It is not painful, but is getting larger.       No Known Allergies   Current Outpatient Medications:  .  Cholecalciferol (VITAMIN D-1000 MAX ST) 1000 units tablet, Take 1,000 Units by mouth daily., Disp: , Rfl:  .  fexofenadine (ALLEGRA) 180 MG tablet, Take 180 mg by mouth daily., Disp: , Rfl:  .  Loratadine (CLARITIN) 10 MG CAPS, Take by mouth. , Disp: , Rfl:  .  naproxen sodium (ALEVE) 220 MG tablet, Take 220 mg by mouth 2 (two) times daily as needed. , Disp: , Rfl:  .  Omega-3 Fatty Acids (FISH OIL) 1000 MG CAPS, Take 1,000 mg by mouth daily. , Disp: , Rfl:  .  tamsulosin (FLOMAX) 0.4 MG CAPS capsule, TAKE 1 CAPSULE(0.4 MG) BY MOUTH DAILY, Disp: 90 capsule, Rfl: 4 .  Vitamin E 400 units TABS, Take 1 tablet by mouth daily., Disp: , Rfl:  .  Zinc 25 MG TABS, Take by mouth daily., Disp: , Rfl:   Review of Systems  Constitutional: Negative.   Respiratory: Negative.   Cardiovascular: Negative.   Gastrointestinal: Negative.     Social History   Tobacco Use  . Smoking status: Former Research scientist (life sciences)  . Smokeless  tobacco: Never Used  . Tobacco comment: remote smoking history, quit in 1981  Substance Use Topics  . Alcohol use: Yes    Alcohol/week: 1.0 - 3.0 standard drinks    Types: 1 - 3 Standard drinks or equivalent per week    Comment: beer or wine   Objective:   BP 114/74 (BP Location: Right Arm, Patient Position: Sitting, Cuff Size: Large)   Pulse (!) 58   Temp 97.7 F (36.5 C) (Oral)   Resp 16   Wt 215 lb (97.5 kg)   SpO2 96%   BMI 33.67 kg/m     Physical Exam  .General appearance: alert, well developed, well nourished, cooperative and in no distress Head: Normocephalic, without obvious abnormality, atraumatic Respiratory: Respirations even and unlabored, normal respiratory rate GU: Left non-tender inguinal mass suspicious for hernia.      Assessment & Plan:     1. Nocturia Greatly improved since starting- tamsulosin (FLOMAX) 0.4 MG CAPS capsule; TAKE 1 CAPSULE(0.4 MG) BY MOUTH DAILY  Dispense: 90 capsule; Refill: 4  2. PSA Elevation recheck- PSA  3. Left inguinal hernia (suspected) Not currently having any pain or discomfort. Counseled this could be repaired electively. He is very active with [employer] [employer] and doesn't feel like he can get time off for a few months. He will call back for surgery  referral at a later time.        Lelon Huh, MD  [facility]

## 2018-03-27 NOTE — Patient Instructions (Signed)
Hernia A hernia happens when an organ or tissue inside your body pushes out through a weak spot in the belly (abdomen). Follow these instructions at home:  Avoid stretching or overusing (straining) the muscles near the hernia.  Do not lift anything heavier than 10 lb (4.5 kg).  Use the muscles in your leg when you lift something up. Do not use the muscles in your back.  When you cough, try to cough gently.  Eat a diet that has a lot of fiber. Eat lots of fruits and vegetables.  Drink enough fluids to keep your pee (urine) clear or pale yellow. Try to drink 6-8 glasses of water a day.  Take medicines to make your poop soft (stool softeners) as told by your doctor.  Lose weight, if you are overweight.  Do not use any tobacco products, including cigarettes, chewing tobacco, or electronic cigarettes. If you need help quitting, ask your doctor.  Keep all follow-up visits as told by your doctor. This is important. Contact a doctor if:  The skin by the hernia gets puffy (swollen) or red.  The hernia is painful. Get help right away if:  You have a fever.  You have belly pain that is getting worse.  You feel sick to your stomach (nauseous) or you throw up (vomit).  You cannot push the hernia back in place by gently pressing on it while you are lying down.  The hernia: ? Changes in shape or size. ? Is stuck outside your belly. ? Changes color. ? Feels hard or tender. This information is not intended to replace advice given to you by your health care provider. Make sure you discuss any questions you have with your health care provider. Document Released: 12/15/2009 Document Revised: 12/03/2015 Document Reviewed: 05/07/2014 Elsevier Interactive Patient Education  2018 Elsevier Inc.  

## 2018-03-28 ENCOUNTER — Ambulatory Visit: Payer: Medicare Other | Admitting: Family Medicine

## 2018-03-28 ENCOUNTER — Telehealth: Payer: Self-pay

## 2018-03-28 ENCOUNTER — Other Ambulatory Visit: Payer: Self-pay | Admitting: Family Medicine

## 2018-03-28 DIAGNOSIS — R972 Elevated prostate specific antigen [PSA]: Secondary | ICD-10-CM

## 2018-03-28 LAB — PSA: PROSTATE SPECIFIC AG, SERUM: 6.6 ng/mL — AB (ref 0.0–4.0)

## 2018-03-28 NOTE — Telephone Encounter (Signed)
-----   Message from Birdie Sons, MD sent at 03/28/2018  7:51 AM EDT ----- psa is still high, need referral to urology. Have entered order for sarah.

## 2018-03-28 NOTE — Telephone Encounter (Signed)
lmtcb

## 2018-03-29 NOTE — Telephone Encounter (Signed)
Patient notified of results. Expressed understanding.

## 2018-04-25 ENCOUNTER — Ambulatory Visit: Payer: Medicare Other | Admitting: Urology

## 2018-04-25 ENCOUNTER — Telehealth: Payer: Self-pay | Admitting: Family Medicine

## 2018-04-25 DIAGNOSIS — K4090 Unilateral inguinal hernia, without obstruction or gangrene, not specified as recurrent: Secondary | ICD-10-CM

## 2018-04-25 NOTE — Telephone Encounter (Signed)
Pt stated he had OV a couple months ago and was advised to see a Psychologist, sport and exercise for hernia repair. Pt stated that he has been out of town and will return in about a month. Pt is requesting that the referral be sent downstairs to [facility] as discussed. Please advise. Thanks TNP

## 2018-04-26 DIAGNOSIS — Z23 Encounter for immunization: Secondary | ICD-10-CM | POA: Diagnosis not present

## 2018-05-28 ENCOUNTER — Ambulatory Visit (INDEPENDENT_AMBULATORY_CARE_PROVIDER_SITE_OTHER): Payer: Medicare Other | Admitting: Urology

## 2018-05-28 ENCOUNTER — Encounter: Payer: Self-pay | Admitting: Urology

## 2018-05-28 VITALS — BP 155/90 | HR 76 | Ht 67.0 in | Wt 226.0 lb

## 2018-05-28 DIAGNOSIS — R972 Elevated prostate specific antigen [PSA]: Secondary | ICD-10-CM

## 2018-05-28 NOTE — Progress Notes (Signed)
05/28/2018 9:22 AM   Alexander Moss January 14, 1946 [id number]  Referring provider: Birdie Sons, MD [address] [place], [place] [address]  CC: Elevated PSA  HPI: I had the pleasure of seeing [name] in urology clinic in consultation from Dr. Caryn Moss for mildly elevated PSA.  He is a healthy 72 year old male with no family history of prostate cancer who was found to have a PSA of 6.6.  This is a slightly increased from his PSAs over the last few years.  He does report a prior biopsy 10 years ago that was reportedly negative.  These records are not available to me, however he thinks his PSA was around 5 at the time of biopsy.  There are no aggravating or alleviating factors.  He is otherwise very healthy.  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  He denies gross hematuria, flank pain, dysuria.  He drinks quite a bit of coffee and diet sodas during the day.  He does report some mild bothersome urinary frequency and nocturia.   PMH: Past Medical History:  Diagnosis Date  . Arthritis   . GERD (gastroesophageal reflux disease)     Surgical History: Past Surgical History:  Procedure Laterality Date  . [place]  . KNEE SURGERY Bilateral 2003  . TONSILLECTOMY AND ADENOIDECTOMY    . TOTAL KNEE ARTHROPLASTY Right 05/23/2016   Procedure: RIGHT TOTAL KNEE ARTHROPLASTY;  Surgeon: Alexander Arabian, MD;  Location: [facility];  Service: Orthopedics;  Laterality: Right;    Allergies: No Known Allergies  Family History: Family History  Problem Relation Age of Onset  . Diabetes Mother     Social History:  reports that he has quit smoking. He has never used smokeless tobacco. He reports that he drinks about 1.0 - 3.0 standard drinks of alcohol per week. He reports that he does not use drugs.  ROS: Please see flowsheet from today's date for complete review of systems.  Physical Exam: BP (!) 155/90 (BP Location: Left Arm,  Patient Position: Sitting, Cuff Size: Large)   Pulse 76   Ht 5\' 7"  (1.702 m)   Wt 226 lb (102.5 kg)   BMI 35.40 kg/m    Constitutional:  Alert and oriented, No acute distress. Cardiovascular: No clubbing, cyanosis, or edema. Respiratory: Normal respiratory effort, no increased work of breathing. GI: Abdomen is soft, nontender, nondistended, no abdominal masses GU: No CVA tenderness DRE: 60 g, smooth, no nodules Lymph: No cervical or inguinal lymphadenopathy. Skin: No rashes, bruises or suspicious lesions. Neurologic: Grossly intact, no focal deficits, moving all 4 extremities. Psychiatric: Normal mood and affect.  Laboratory Data: PSA history 03/2018: 6.6 12/2017: 5.6 01/2017: 4.6 01/2015: 3.6 12/2014: 4.8  Pertinent Imaging: None to review  Assessment & Plan:   In summary, [name] is a healthy 73 year old man with a very mildly elevated PSA of 6.6.  He reports a history of a negative biopsy for PSA of around 510 years prior.  He is asymptomatic and has no family history.  We did discuss the AUA guidelines that do not recommend regular routine PSA screening in men over 70.  We also discussed that the normal range of PSA for men in their 70s is 6.5 and below.  We reviewed the implications of an elevated PSA and the uncertainty surrounding it. In general, a man's PSA increases with age and is produced by both normal and cancerous prostate tissue. The differential diagnosis for elevated  PSA includes BPH, prostate cancer, infection, recent intercourse/ejaculation, recent urethroscopic manipulation (foley placement/cystoscopy) or trauma, and prostatitis.   Management of an elevated PSA can include observation or prostate biopsy and we discussed this in detail. Our goal is to detect clinically significant prostate cancers, and manage with either active surveillance, surgery, or radiation for localized disease. Risks of prostate biopsy include bleeding, infection (including life threatening  sepsis), pain, and lower urinary symptoms. Hematuria, hematospermia, and blood in the stool are all common after biopsy and can persist up to 4 weeks.   After long discussion, he would like to repeat the PSA in 3 months.  I think this is extremely reasonable.  If PSA is stable at that time likely discontinue PSA screening.  If significant elevation would consider prostate biopsy.  Return will call with PSA results in 3 mo.Billey Co, MD  [facility] [address], [place], [place] [address] [phone]

## 2018-05-30 ENCOUNTER — Encounter: Payer: Self-pay | Admitting: General Surgery

## 2018-05-30 ENCOUNTER — Other Ambulatory Visit: Payer: Self-pay | Admitting: General Surgery

## 2018-05-30 ENCOUNTER — Ambulatory Visit (INDEPENDENT_AMBULATORY_CARE_PROVIDER_SITE_OTHER): Payer: Medicare Other | Admitting: General Surgery

## 2018-05-30 ENCOUNTER — Other Ambulatory Visit: Payer: Self-pay

## 2018-05-30 VITALS — BP 148/84 | HR 64 | Temp 97.3°F | Resp 16 | Ht 67.5 in | Wt 224.2 lb

## 2018-05-30 DIAGNOSIS — K4090 Unilateral inguinal hernia, without obstruction or gangrene, not specified as recurrent: Secondary | ICD-10-CM | POA: Diagnosis not present

## 2018-05-30 NOTE — Patient Instructions (Addendum)
Please call our office tomorrow to speak with a surgery scheduler.    You have chose to have your hernia repaired. This will be done by Dr. Fredirick Maudlin  At  [facility].    You will need to arrange to be out of work for 2 weeks and then return with a lifting restrictions for 4 more weeks. Please send any FMLA paperwork prior to surgery and we will fill this out and fax it back to your employer within 3 business days.  You may have a bruise in your groin and also swelling and brusing in your testicle area. You may use ice 4-5 times daily for 15-20 minutes each time. Make sure that you place a barrier between you and the ice pack. To decrease the swelling, you may roll up a bath towel and place it vertically in between your thighs with your testicles resting on the towel. You will want to keep this area elevated as much as possible for several days following surgery.    Inguinal Hernia, Adult Muscles help keep everything in the body in its proper place. But if a weak spot in the muscles develops, something can poke through. That is called a hernia. When this happens in the lower part of the belly (abdomen), it is called an inguinal hernia. (It takes its name from a part of the body in this region called the inguinal canal.) A weak spot in the wall of muscles lets some fat or part of the small intestine bulge through. An inguinal hernia can develop at any age. Men get them more often than women. CAUSES  In adults, an inguinal hernia develops over time.  It can be triggered by:  Suddenly straining the muscles of the lower abdomen.  Lifting heavy objects.  Straining to have a bowel movement. Difficult bowel movements (constipation) can lead to this.  Constant coughing. This may be caused by smoking or lung disease.  Being overweight.  Being pregnant.  Working at a job that requires long periods of standing or heavy lifting.  Having had an inguinal hernia before. One type can be an  emergency situation. It is called a strangulated inguinal hernia. It develops if part of the small intestine slips through the weak spot and cannot get back into the abdomen. The blood supply can be cut off. If that happens, part of the intestine may die. This situation requires emergency surgery. SYMPTOMS  Often, a small inguinal hernia has no symptoms. It is found when a healthcare provider does a physical exam. Larger hernias usually have symptoms.   In adults, symptoms may include:  A lump in the groin. This is easier to see when the person is standing. It might disappear when lying down.  In men, a lump in the scrotum.  Pain or burning in the groin. This occurs especially when lifting, straining or coughing.  A dull ache or feeling of pressure in the groin.  Signs of a strangulated hernia can include:  A bulge in the groin that becomes very painful and tender to the touch.  A bulge that turns red or purple.  Fever, nausea and vomiting.  Inability to have a bowel movement or to pass gas. DIAGNOSIS  To decide if you have an inguinal hernia, a healthcare provider will probably do a physical examination.  This will include asking questions about any symptoms you have noticed.  The healthcare provider might feel the groin area and ask you to cough. If an inguinal hernia is  felt, the healthcare provider may try to slide it back into the abdomen.  Usually no other tests are needed. TREATMENT  Treatments can vary. The size of the hernia makes a difference. Options include:  Watchful waiting. This is often suggested if the hernia is small and you have had no symptoms.  No medical procedure will be done unless symptoms develop.  You will need to watch closely for symptoms. If any occur, contact your healthcare provider right away.  Surgery. This is used if the hernia is larger or you have symptoms.  Open surgery. This is usually an outpatient procedure (you will not stay  overnight in a hospital). An cut (incision) is made through the skin in the groin. The hernia is put back inside the abdomen. The weak area in the muscles is then repaired by herniorrhaphy or hernioplasty. Herniorrhaphy: in this type of surgery, the weak muscles are sewn back together. Hernioplasty: a patch or mesh is used to close the weak area in the abdominal wall.  Laparoscopy. In this procedure, a surgeon makes small incisions. A thin tube with a tiny video camera (called a laparoscope) is put into the abdomen. The surgeon repairs the hernia with mesh by looking with the video camera and using two long instruments. HOME CARE INSTRUCTIONS   After surgery to repair an inguinal hernia:  You will need to take pain medicine prescribed by your healthcare provider. Follow all directions carefully.  You will need to take care of the wound from the incision.  Your activity will be restricted for awhile. This will probably include no heavy lifting for several weeks. You also should not do anything too active for a few weeks. When you can return to work will depend on the type of job that you have.  During "watchful waiting" periods, you should:  Maintain a healthy weight.  Eat a diet high in fiber (fruits, vegetables and whole grains).  Drink plenty of fluids to avoid constipation. This means drinking enough water and other liquids to keep your urine clear or pale yellow.  Do not lift heavy objects.  Do not stand for long periods of time.  Quit smoking. This should keep you from developing a frequent cough. SEEK MEDICAL CARE IF:   A bulge develops in your groin area.  You feel pain, a burning sensation or pressure in the groin. This might be worse if you are lifting or straining.  You develop a fever of more than 100.5 F (38.1 C). SEEK IMMEDIATE MEDICAL CARE IF:   Pain in the groin increases suddenly.  A bulge in the groin gets bigger suddenly and does not go down.  For men, there  is sudden pain in the scrotum. Or, the size of the scrotum increases.  A bulge in the groin area becomes red or purple and is painful to touch.  You have nausea or vomiting that does not go away.  You feel your heart beating much faster than normal.  You cannot have a bowel movement or pass gas.  You develop a fever of more than 102.0 F (38.9 C).   This information is not intended to replace advice given to you by your health care provider. Make sure you discuss any questions you have with your health care provider.   Document Released: 11/13/2008 Document Revised: 09/19/2011 Document Reviewed: 12/29/2014 Elsevier Interactive Patient Education [employer].

## 2018-05-30 NOTE — Progress Notes (Signed)
Patient ID: Alexander Moss, male   DOB: 12/16/1945, 72 y.o.   MRN: [id number]  Chief Complaint  Patient presents with  . New Patient (Initial Visit)    Left Inguinal hernia    HPI Alexander Moss is a 72 y.o. male.   He has been referred by his PCP for surgical evaluation of an inguinal hernia.  He states that it was first noticed approximately 6 months ago.  He does endorse some mild discomfort in his left groin however denies specifically significant pain.  He denies nausea, vomiting, fevers, chills.  No constipation or diarrhea.  He reports that to him it appears to be a slight bulge that descends down into his scrotum.  He has never had an episode of obstipation.  He denies any symptoms of bowel obstruction or blood in his stool.  The discomfort does not radiate anywhere.  He does not feel like there are any activities that exacerbate or make the bulge worse.  He reports having a very active physical lifestyle including working for [employer] for [employer], enjoying time with his grandsons, and hiking and being out of doors.  He does report that when he lies supine he is able to manually reduce the bulge.   Past Medical History:  Diagnosis Date  . Arthritis   . GERD (gastroesophageal reflux disease)     Past Surgical History:  Procedure Laterality Date  . [place]  . KNEE SURGERY Bilateral 2003  . TONSILLECTOMY AND ADENOIDECTOMY    . TOTAL KNEE ARTHROPLASTY Right 05/23/2016   Procedure: RIGHT TOTAL KNEE ARTHROPLASTY;  Surgeon: Gaynelle Arabian, MD;  Location: [facility];  Service: Orthopedics;  Laterality: Right;    Family History  Problem Relation Age of Onset  . Diabetes Mother   . Lung cancer Father   . Liver cancer Sister     Social History Social History   Tobacco Use  . Smoking status: Former Smoker    Packs/day: 2.00    Last attempt to quit: 05/30/1980    Years since quitting: 38.0  . Smokeless tobacco: Never Used  . Tobacco comment: remote smoking history,  quit in 1981  Substance Use Topics  . Alcohol use: Yes    Alcohol/week: 1.0 - 3.0 standard drinks    Types: 1 - 3 Standard drinks or equivalent per week    Comment: beer or wine  . Drug use: No    No Known Allergies  Current Outpatient Medications  Medication Sig Dispense Refill  . fexofenadine (ALLEGRA) 180 MG tablet Take 180 mg by mouth daily.    . naproxen sodium (ALEVE) 220 MG tablet Take 220 mg by mouth 2 (two) times daily as needed.     . Omega-3 Fatty Acids (FISH OIL) 1000 MG CAPS Take 1,000 mg by mouth daily.     . tamsulosin (FLOMAX) 0.4 MG CAPS capsule TAKE 1 CAPSULE(0.4 MG) BY MOUTH DAILY 90 capsule 4  . Vitamin E 400 units TABS Take 1 tablet by mouth daily.    . Zinc 25 MG TABS Take by mouth daily.     No current facility-administered medications for this visit.     Review of Systems Review of Systems  Musculoskeletal: Positive for back pain.       Chronic, related to an injury in his youth.  All other systems reviewed and are negative. or as discussed in the HPI  Blood pressure (!) 148/84, pulse 64, temperature (!) 97.3 F (36.3 C), temperature source Temporal, resp.  rate 16, height 5' 7.5" (1.715 m), weight 224 lb 3.2 oz (101.7 kg), SpO2 93 %.  Physical Exam Physical Exam  Constitutional: He is oriented to person, place, and time. He appears well-developed and well-nourished. No distress.  HENT:  Head: Normocephalic and atraumatic.  Mouth/Throat: Oropharynx is clear and moist. No oropharyngeal exudate.  Bilateral hearing aids.  Eyes: Pupils are equal, round, and reactive to light. Right eye exhibits no discharge. Left eye exhibits no discharge. No scleral icterus.  Neck: Normal range of motion. Neck supple. No tracheal deviation present. No thyromegaly present.  Cardiovascular: Normal rate, regular rhythm and intact distal pulses.  No murmur heard. Pulmonary/Chest: Effort normal and breath sounds normal.  Abdominal: Soft. Bowel sounds are normal. A hernia is  present. Hernia confirmed positive in the left inguinal area.  Genitourinary: Penis normal. Circumcised. No phimosis. No discharge found.     Genitourinary Comments:  Rectal exam deferred. There is no hernia appreciated on the right.  There is a bulge in the left inguinal canal that descends into the left side of the scrotum. Non-tender to palpation, however bowel is present within the hernia.  Musculoskeletal: Normal range of motion. He exhibits no edema.  Surgical scar over right knee from prior arthroplasty.  Lymphadenopathy:    He has no cervical adenopathy.  Neurological: He is alert and oriented to person, place, and time.  Skin: Skin is warm and dry.  Tanned on exposed skin surfaces.  Innumerable small fatty tumors/deposits on arms and abdomen.  Psychiatric: He has a normal mood and affect.    Data Reviewed EMR reviewed. No pertinent additional information is available.  Assessment    Alexander Moss is a 72 year old man with an obvious left inguinal hernia.  Based upon my physical exam it seems to be a sliding-type hernia with involvement of the intestines.  He currently denies any symptoms current or past of incarceration or strangulation.     Plan  He is interested in repair however wishes to defer this until March.  We discussed the risks of doing so, however at this time he is not symptomatic.  He was cautioned about the risks of worsening symptoms, enlargement of the hernia which could potentially complicate repair, and given the warning signs for incarceration or strangulation.  He feels comfortable with this decision and we will plan to have him undergo his repair as desired, in March.  We will see him in clinic a couple of weeks before for an update of his history and physical.     I discussed the possibility of incarceration, strangulation, enlargement in size over time, and the risk of emergency surgery in the face of strangulation. I also discussed the risks of surgery  including injury to adjacent structures, recurrence which can be up to 30% in the case of complex hernias, use of prosthetic materials (mesh) and the increased risk of infection, post-op infection and the possible need for re-operation and removal of mesh if used, possibility of post-op bowel obstruction or ileus, and the risks of general anesthetic including MI, CVA, sudden death or even reaction to anesthetic medications. The patient understands the risks, any and all questions were answered to the patient's satisfaction.       Fredirick Maudlin 05/30/2018, 1:36 PM

## 2018-07-25 ENCOUNTER — Telehealth: Payer: Self-pay | Admitting: *Deleted

## 2018-07-25 NOTE — Telephone Encounter (Signed)
Patient saw Dr.Cannon in November 2019 and he is now ready to schedule surgery with her. Patient stated if you call him today 07/25/18 to call [phone] and if it will be tomorrow call wifes number after 11:30am [phone]

## 2018-07-26 NOTE — Telephone Encounter (Signed)
Spoke with the patient about getting his surgery scheduled. He would like at the end of February. The patient is scheduled for surgery at [facility] with Dr Celine Ahr on 09/07/18. He will pre admit at the hospital prior and we will call him with this appointment date and time. He will be seen by Dr Celine Ahr for a pre op visit on 08/29/18 at 2:45 pm. Arvilla Meres, RN will be assisting with this case.

## 2018-07-28 NOTE — Telephone Encounter (Signed)
encounter opened in error, closed for administrative reasons.

## 2018-07-31 ENCOUNTER — Other Ambulatory Visit: Payer: Self-pay | Admitting: General Surgery

## 2018-07-31 DIAGNOSIS — K4090 Unilateral inguinal hernia, without obstruction or gangrene, not specified as recurrent: Secondary | ICD-10-CM

## 2018-08-01 ENCOUNTER — Telehealth: Payer: Self-pay

## 2018-08-01 NOTE — Telephone Encounter (Signed)
The patient is scheduled to pre admit at the hospital in the [place] on 08/24/18 at 1:00 pm. The patient is aware of date, time, and instructions.

## 2018-08-11 DIAGNOSIS — K4090 Unilateral inguinal hernia, without obstruction or gangrene, not specified as recurrent: Secondary | ICD-10-CM

## 2018-08-11 HISTORY — DX: Unilateral inguinal hernia, without obstruction or gangrene, not specified as recurrent: K40.90

## 2018-08-24 ENCOUNTER — Encounter
Admission: RE | Admit: 2018-08-24 | Discharge: 2018-08-24 | Disposition: A | Discharge status: Home / Self Care | Payer: PPO | Source: Ambulatory Visit | Attending: General Surgery | Admitting: General Surgery

## 2018-08-24 ENCOUNTER — Other Ambulatory Visit: Payer: Self-pay

## 2018-08-24 DIAGNOSIS — Z01810 Encounter for preprocedural cardiovascular examination: Secondary | ICD-10-CM | POA: Diagnosis not present

## 2018-08-24 DIAGNOSIS — Z01818 Encounter for other preprocedural examination: Secondary | ICD-10-CM | POA: Diagnosis not present

## 2018-08-24 HISTORY — DX: Unilateral inguinal hernia, without obstruction or gangrene, not specified as recurrent: K40.90

## 2018-08-24 NOTE — Patient Instructions (Signed)
Your procedure is scheduled on: Friday, February 28 Report to Day Surgery on the 2nd floor of the [facility]. To find out your arrival time, please call [phone] between 1PM - 3PM on: Thursday, February 27  REMEMBER: Instructions that are not followed completely may result in serious medical risk, up to and including death; or upon the discretion of your surgeon and anesthesiologist your surgery may need to be rescheduled.  Do not eat food after midnight the night before surgery.  No gum chewing, lozengers or hard candies.  You may however, drink CLEAR liquids up to 2 hours before you are scheduled to arrive for your surgery. Do not drink anything within 2 hours of the start of your surgery.  Clear liquids include: - water  - apple juice without pulp - gatorade - black coffee or tea (Do NOT add milk or creamers to the coffee or tea) Do NOT drink anything that is not on this list.  No Alcohol for 24 hours before or after surgery.  No Smoking including e-cigarettes for 24 hours prior to surgery.  No chewable tobacco products for at least 6 hours prior to surgery.  No nicotine patches on the day of surgery.  On the morning of surgery brush your teeth with toothpaste and water, you may rinse your mouth with mouthwash if you wish. Do not swallow any toothpaste or mouthwash.  Notify your doctor if there is any change in your medical condition (cold, fever, infection).  Do not wear jewelry, make-up, hairpins, clips or nail polish.  Do not wear lotions, powders, or perfumes.   Do not shave 48 hours prior to surgery.   Contacts and dentures may not be worn into surgery.  Do not bring valuables to the hospital, including drivers license, insurance or credit cards.  [facility] is not responsible for any belongings or valuables.   DO NOT TAKE ANY MEDICATIONS THE MORNING OF SURGERY  Use CHG Soap as directed on instruction sheet.  STARTING FEB. 21 - Stop Anti-inflammatories  (NSAIDS) such as Advil, Aleve, Ibuprofen, Motrin, Naproxen, Naprosyn and Aspirin based products such as Excedrin, Goodys Powder, BC Powder. (May take Tylenol or Acetaminophen if needed.)  STARTING FEB. 21 - Stop ANY OVER THE COUNTER supplements until after surgery. (May continue Vitamin D, Vitamin B, and multivitamin.)  Wear comfortable clothing (specific to your surgery type) to the hospital.  Plan for stool softeners for home use.  If you are being discharged the day of surgery, you will not be allowed to drive home. You will need a responsible adult to drive you home and stay with you that night.   If you are taking public transportation, you will need to have a responsible adult with you. Please confirm with your physician that it is acceptable to use public transportation.   Please call [phone] if you have any questions about these instructions.

## 2018-08-29 ENCOUNTER — Encounter: Payer: Self-pay | Admitting: General Surgery

## 2018-08-29 ENCOUNTER — Other Ambulatory Visit: Payer: Self-pay

## 2018-08-29 ENCOUNTER — Ambulatory Visit (INDEPENDENT_AMBULATORY_CARE_PROVIDER_SITE_OTHER): Payer: PPO | Admitting: General Surgery

## 2018-08-29 ENCOUNTER — Other Ambulatory Visit: Payer: PPO

## 2018-08-29 VITALS — BP 139/86 | HR 62 | Temp 97.7°F | Resp 20 | Ht 67.5 in | Wt 223.4 lb

## 2018-08-29 DIAGNOSIS — K4090 Unilateral inguinal hernia, without obstruction or gangrene, not specified as recurrent: Secondary | ICD-10-CM

## 2018-08-29 DIAGNOSIS — R972 Elevated prostate specific antigen [PSA]: Secondary | ICD-10-CM

## 2018-08-29 NOTE — Patient Instructions (Addendum)
Left Inguinal Hernia Surgery as scheduled 09-07-18   Inguinal Hernia, Adult An inguinal hernia is when fat or your intestines push through a weak spot in a muscle where your leg meets your lower belly (groin). This causes a rounded lump (bulge). This kind of hernia could also be:  In your scrotum, if you are male.  In folds of skin around your vagina, if you are male. There are three types of inguinal hernias. These include:  Hernias that can be pushed back into the belly (are reducible). This type rarely causes pain.  Hernias that cannot be pushed back into the belly (are incarcerated).  Hernias that cannot be pushed back into the belly and lose their blood supply (are strangulated). This type needs emergency surgery. If you do not have symptoms, you may not need treatment. If you have symptoms or a large hernia, you may need surgery. Follow these instructions at home: Lifestyle  Do these things if told by your doctor so you do not have trouble pooping (constipation): ? Drink enough fluid to keep your pee (urine) pale yellow. ? Eat foods that have a lot of fiber. These include fresh fruits and vegetables, whole grains, and beans. ? Limit foods that are high in fat and processed sugars. These include foods that are fried or sweet. ? Take medicine for trouble pooping.  Avoid lifting heavy objects.  Avoid standing for long amounts of time.  Do not use any products that contain nicotine or tobacco. These include cigarettes and e-cigarettes. If you need help quitting, ask your doctor.  Stay at a healthy weight. General instructions  You may try to push your hernia in by very gently pressing on it when you are lying down. Do not try to force the bulge back in if it will not push in easily.  Watch your hernia for any changes in shape, size, or color. Tell your doctor if you see any changes.  Take over-the-counter and prescription medicines only as told by your doctor.  Keep all  follow-up visits as told by your doctor. This is important. Contact a doctor if:  You have a fever.  You have new symptoms.  Your symptoms get worse. Get help right away if:  The area where your leg meets your lower belly has: ? Pain that gets worse suddenly. ? A bulge that gets bigger suddenly, and it does not get smaller after that. ? A bulge that turns red or purple. ? A bulge that is painful when you touch it.  You are a man, and your scrotum: ? Suddenly feels painful. ? Suddenly changes in size.  You cannot push the hernia in by very gently pressing on it when you are lying down. Do not try to force the bulge back in if it will not push in easily.  You feel sick to your stomach (nauseous), and that feeling does not go away.  You throw up (vomit), and that keeps happening.  You have a fast heartbeat.  You cannot poop (have a bowel movement) or pass gas. These symptoms may be an emergency. Do not wait to see if the symptoms will go away. Get medical help right away. Call your local emergency services (911 in the [place] Summary  An inguinal hernia is when fat or your intestines push through a weak spot in a muscle where your leg meets your lower belly (groin). This causes a rounded lump (bulge).  If you do not have symptoms, you may not need treatment.  If you have symptoms or a large hernia, you may need surgery.  Avoid lifting heavy objects. Also avoid standing for long amounts of time.  Do not try to force the bulge back in if it will not push in easily. This information is not intended to replace advice given to you by your health care provider. Make sure you discuss any questions you have with your health care provider. Document Released: 07/28/2006 Document Revised: 07/29/2017 Document Reviewed: 03/29/2017 Elsevier Interactive Patient Education  2019 [employer].

## 2018-08-29 NOTE — Progress Notes (Signed)
Alexander Moss is here today to discuss his upcoming inguinal hernia surgery.  He is a 73 year old man whom I first saw in November 2019.  My initial history of present illness is copied here:  "Alexander Moss is a 73 y.o. male.   He has been referred by his PCP for surgical evaluation of an inguinal hernia.  He states that it was first noticed approximately 6 months ago.  He does endorse some mild discomfort in his left groin however denies specifically significant pain.  He denies nausea, vomiting, fevers, chills.  No constipation or diarrhea.  He reports that to him it appears to be a slight bulge that descends down into his scrotum.  He has never had an episode of obstipation.  He denies any symptoms of bowel obstruction or blood in his stool.  The discomfort does not radiate anywhere.  He does not feel like there are any activities that exacerbate or make the bulge worse.  He reports having a very active physical lifestyle including working for [employer] for [employer], enjoying time with his grandsons, and hiking and being out of doors.  He does report that when he lies supine he is able to manually reduce the bulge."  At the time of our initial visit, he wished to delay his operation in order to participate in additional [employer] activities.  He contacted our office in January 2020, and expressed a desire to have his operation scheduled.  He is here today to update his history and physical.  He states that he has not noticed any new symptoms related to his hernia.  He thinks that perhaps the bulge is a little bit larger.  He denies any fevers, chills, nausea, vomiting, constipation, obstipation, or new urinary symptoms.  There have been no significant changes to his past medical or surgical history.  Past Medical History:  Diagnosis Date  . Arthritis   . GERD (gastroesophageal reflux disease)   . Hyperlipidemia   . Left inguinal hernia 08/2018   Past Surgical History:  Procedure  Laterality Date  . [place]  . JOINT REPLACEMENT    . KNEE SURGERY Bilateral 2003  . TONSILLECTOMY AND ADENOIDECTOMY    . TOTAL KNEE ARTHROPLASTY Right 05/23/2016   Procedure: RIGHT TOTAL KNEE ARTHROPLASTY;  Surgeon: Gaynelle Arabian, MD;  Location: [facility];  Service: Orthopedics;  Laterality: Right;   Family History  Problem Relation Age of Onset  . Diabetes Mother   . Lung cancer Father   . Liver cancer Sister    Social History   Tobacco Use  . Smoking status: Former Smoker    Packs/day: 2.00    Last attempt to quit: 05/30/1980    Years since quitting: 38.2  . Smokeless tobacco: Never Used  . Tobacco comment: remote smoking history, quit in 1981  Substance Use Topics  . Alcohol use: Yes    Alcohol/week: 1.0 - 3.0 standard drinks    Types: 1 - 3 Standard drinks or equivalent per week    Comment: beer or wine  . Drug use: No   Current Meds  Medication Sig  . Calcium Carb-Cholecalciferol (CALCIUM 600 + D PO) Take 1 tablet by mouth daily at 12 noon.  . calcium carbonate (TUMS EX) 750 MG chewable tablet Chew 750-1,500 mg by mouth daily as needed for heartburn.  . fexofenadine (ALLEGRA) 180 MG tablet Take 180 mg by mouth daily.  . naproxen sodium (ALEVE) 220 MG tablet Take 220 mg by mouth 2 (  two) times daily.   . Omega-3 Fatty Acids (FISH OIL ULTRA) 1400 MG CAPS Take 1,400 mg by mouth daily.  . tamsulosin (FLOMAX) 0.4 MG CAPS capsule TAKE 1 CAPSULE(0.4 MG) BY MOUTH DAILY (Patient taking differently: Take 0.4 mg by mouth daily at 12 noon. TAKE 1 CAPSULE(0.4 MG) BY MOUTH DAILY)  . vitamin B-12 (CYANOCOBALAMIN) 1000 MCG tablet Take 1,000 mcg by mouth daily at 12 noon.  . Vitamin E 400 units TABS Take 400 Units by mouth daily.   . Zinc 25 MG TABS Take 25 mg by mouth daily.    No Known Allergies  Vitals:   08/29/18 1427  BP: 139/86  Pulse: 62  Resp: 20  Temp: 97.7 F (36.5 C)  SpO2: 96%   Physical Exam  Constitutional: He is oriented to person, place, and  time. He appears well-developed and well-nourished. No distress.  HENT:  Head: Normocephalic and atraumatic.  Eyes: Pupils are equal, round, and reactive to light. Right eye exhibits no discharge. Left eye exhibits no discharge. No scleral icterus.  Neck: Normal range of motion. Neck supple. No thyromegaly present.  Cardiovascular: Normal rate and regular rhythm.  No murmur heard. Pulmonary/Chest: Effort normal and breath sounds normal.  Abdominal: Soft.  Protuberant, consistent with his level of obesity.  Genitourinary:    Genitourinary Comments: Left inguinal hernia present with palpable bowel within the scrotum.   Musculoskeletal:        General: Tenderness present.     Comments: Left knee--arthritis, per patient.  Neurological: He is alert and oriented to person, place, and time.  Skin: Skin is warm and dry.  Psychiatric: He has a normal mood and affect. His behavior is normal.   Impression and plan: Alexander Moss is a 73 year old man with a left inguinal hernia that has become increasingly symptomatic.  He did not desire surgical repair.  I have offered him an open knee repair.  This is currently scheduled for Friday, September 07, 2018. I have explained the procedure, risks, and aftercare of inguinal hernia repair to [name].   Risks include but are not limited to bleeding, infection, wound problems, anesthesia, recurrence, bladder or intestine injury, urinary retention, testicular dysfunction, chronic pain, mesh problems.  He  seems to understand and agrees to proceed.  Questions were answered to his stated satisfaction.

## 2018-08-29 NOTE — H&P (View-Only) (Signed)
Alexander Moss is here today to discuss his upcoming inguinal hernia surgery.  He is a 73 year old man whom I first saw in November 2019.  My initial history of present illness is copied here:  "Alexander Moss is a 73 y.o. male.   He has been referred by his PCP for surgical evaluation of an inguinal hernia.  He states that it was first noticed approximately 6 months ago.  He does endorse some mild discomfort in his left groin however denies specifically significant pain.  He denies nausea, vomiting, fevers, chills.  No constipation or diarrhea.  He reports that to him it appears to be a slight bulge that descends down into his scrotum.  He has never had an episode of obstipation.  He denies any symptoms of bowel obstruction or blood in his stool.  The discomfort does not radiate anywhere.  He does not feel like there are any activities that exacerbate or make the bulge worse.  He reports having a very active physical lifestyle including working for [employer] for [employer], enjoying time with his grandsons, and hiking and being out of doors.  He does report that when he lies supine he is able to manually reduce the bulge."  At the time of our initial visit, he wished to delay his operation in order to participate in additional [employer] activities.  He contacted our office in January 2020, and expressed a desire to have his operation scheduled.  He is here today to update his history and physical.  He states that he has not noticed any new symptoms related to his hernia.  He thinks that perhaps the bulge is a little bit larger.  He denies any fevers, chills, nausea, vomiting, constipation, obstipation, or new urinary symptoms.  There have been no significant changes to his past medical or surgical history.  Past Medical History:  Diagnosis Date  . Arthritis   . GERD (gastroesophageal reflux disease)   . Hyperlipidemia   . Left inguinal hernia 08/2018   Past Surgical History:  Procedure  Laterality Date  . [place]  . JOINT REPLACEMENT    . KNEE SURGERY Bilateral 2003  . TONSILLECTOMY AND ADENOIDECTOMY    . TOTAL KNEE ARTHROPLASTY Right 05/23/2016   Procedure: RIGHT TOTAL KNEE ARTHROPLASTY;  Surgeon: Gaynelle Arabian, MD;  Location: [facility];  Service: Orthopedics;  Laterality: Right;   Family History  Problem Relation Age of Onset  . Diabetes Mother   . Lung cancer Father   . Liver cancer Sister    Social History   Tobacco Use  . Smoking status: Former Smoker    Packs/day: 2.00    Last attempt to quit: 05/30/1980    Years since quitting: 38.2  . Smokeless tobacco: Never Used  . Tobacco comment: remote smoking history, quit in 1981  Substance Use Topics  . Alcohol use: Yes    Alcohol/week: 1.0 - 3.0 standard drinks    Types: 1 - 3 Standard drinks or equivalent per week    Comment: beer or wine  . Drug use: No   Current Meds  Medication Sig  . Calcium Carb-Cholecalciferol (CALCIUM 600 + D PO) Take 1 tablet by mouth daily at 12 noon.  . calcium carbonate (TUMS EX) 750 MG chewable tablet Chew 750-1,500 mg by mouth daily as needed for heartburn.  . fexofenadine (ALLEGRA) 180 MG tablet Take 180 mg by mouth daily.  . naproxen sodium (ALEVE) 220 MG tablet Take 220 mg by mouth 2 (  two) times daily.   . Omega-3 Fatty Acids (FISH OIL ULTRA) 1400 MG CAPS Take 1,400 mg by mouth daily.  . tamsulosin (FLOMAX) 0.4 MG CAPS capsule TAKE 1 CAPSULE(0.4 MG) BY MOUTH DAILY (Patient taking differently: Take 0.4 mg by mouth daily at 12 noon. TAKE 1 CAPSULE(0.4 MG) BY MOUTH DAILY)  . vitamin B-12 (CYANOCOBALAMIN) 1000 MCG tablet Take 1,000 mcg by mouth daily at 12 noon.  . Vitamin E 400 units TABS Take 400 Units by mouth daily.   . Zinc 25 MG TABS Take 25 mg by mouth daily.    No Known Allergies  Vitals:   08/29/18 1427  BP: 139/86  Pulse: 62  Resp: 20  Temp: 97.7 F (36.5 C)  SpO2: 96%   Physical Exam  Constitutional: He is oriented to person, place, and  time. He appears well-developed and well-nourished. No distress.  HENT:  Head: Normocephalic and atraumatic.  Eyes: Pupils are equal, round, and reactive to light. Right eye exhibits no discharge. Left eye exhibits no discharge. No scleral icterus.  Neck: Normal range of motion. Neck supple. No thyromegaly present.  Cardiovascular: Normal rate and regular rhythm.  No murmur heard. Pulmonary/Chest: Effort normal and breath sounds normal.  Abdominal: Soft.  Protuberant, consistent with his level of obesity.  Genitourinary:    Genitourinary Comments: Left inguinal hernia present with palpable bowel within the scrotum.   Musculoskeletal:        General: Tenderness present.     Comments: Left knee--arthritis, per patient.  Neurological: He is alert and oriented to person, place, and time.  Skin: Skin is warm and dry.  Psychiatric: He has a normal mood and affect. His behavior is normal.   Impression and plan: Alexander Moss is a 73 year old man with a left inguinal hernia that has become increasingly symptomatic.  He did not desire surgical repair.  I have offered him an open knee repair.  This is currently scheduled for Friday, September 07, 2018. I have explained the procedure, risks, and aftercare of inguinal hernia repair to Alexander Moss.   Risks include but are not limited to bleeding, infection, wound problems, anesthesia, recurrence, bladder or intestine injury, urinary retention, testicular dysfunction, chronic pain, mesh problems.  He  seems to understand and agrees to proceed.  Questions were answered to his stated satisfaction.

## 2018-08-30 ENCOUNTER — Telehealth: Payer: Self-pay

## 2018-08-30 LAB — FPSA% REFLEX
% FREE PSA: 11.4 %
PSA, FREE: 0.49 ng/mL

## 2018-08-30 LAB — PSA TOTAL (REFLEX TO FREE): Prostate Specific Ag, Serum: 4.3 ng/mL — ABNORMAL HIGH (ref 0.0–4.0)

## 2018-08-30 NOTE — Telephone Encounter (Signed)
-----   Message from Billey Co, MD sent at 08/30/2018 10:09 AM EST ----- Please let him know his PSA is down to 4.3 from 6.6 previously.  This is stable from his value of 4.8 three years ago.  Normal PSA range for his age is less than 6.5, and the AUA does not recommend screening men over the age of 43.  These are all very reassuring findings.  Recommend discontinuing PSA screening as we discussed previously in clinic.  Happy to see him in clinic in the future if he would like to discuss further.  Nickolas Madrid, MD 08/30/2018

## 2018-08-30 NOTE — Telephone Encounter (Signed)
Called and advised patient of PSA results and Dr Willene Hatchet recommendation. Patient verbalized understanding.

## 2018-09-07 ENCOUNTER — Encounter
Admission: RE | Disposition: A | Discharge status: Home / Self Care | Payer: Self-pay | Source: Home / Self Care | Attending: General Surgery

## 2018-09-07 ENCOUNTER — Ambulatory Visit: Payer: PPO | Admitting: Anesthesiology

## 2018-09-07 ENCOUNTER — Other Ambulatory Visit: Payer: Self-pay

## 2018-09-07 ENCOUNTER — Encounter: Payer: Self-pay | Admitting: *Deleted

## 2018-09-07 ENCOUNTER — Ambulatory Visit
Admission: RE | Admit: 2018-09-07 | Discharge: 2018-09-07 | Disposition: A | Discharge status: Home / Self Care | Payer: PPO | Attending: General Surgery | Admitting: General Surgery

## 2018-09-07 DIAGNOSIS — K4090 Unilateral inguinal hernia, without obstruction or gangrene, not specified as recurrent: Secondary | ICD-10-CM

## 2018-09-07 DIAGNOSIS — K219 Gastro-esophageal reflux disease without esophagitis: Secondary | ICD-10-CM | POA: Diagnosis not present

## 2018-09-07 DIAGNOSIS — Z87891 Personal history of nicotine dependence: Secondary | ICD-10-CM | POA: Diagnosis not present

## 2018-09-07 DIAGNOSIS — M1990 Unspecified osteoarthritis, unspecified site: Secondary | ICD-10-CM | POA: Insufficient documentation

## 2018-09-07 DIAGNOSIS — E669 Obesity, unspecified: Secondary | ICD-10-CM | POA: Diagnosis not present

## 2018-09-07 DIAGNOSIS — Z79899 Other long term (current) drug therapy: Secondary | ICD-10-CM | POA: Diagnosis not present

## 2018-09-07 DIAGNOSIS — Z6834 Body mass index (BMI) 34.0-34.9, adult: Secondary | ICD-10-CM | POA: Diagnosis not present

## 2018-09-07 HISTORY — PX: INGUINAL HERNIA REPAIR: SHX194

## 2018-09-07 SURGERY — REPAIR, HERNIA, INGUINAL, ADULT
Anesthesia: General | Laterality: Left

## 2018-09-07 MED ORDER — ONDANSETRON HCL 4 MG/2ML IJ SOLN
INTRAMUSCULAR | Status: AC
  Filled 2018-09-07: qty 2

## 2018-09-07 MED ORDER — EPHEDRINE SULFATE 50 MG/ML IJ SOLN
INTRAMUSCULAR | Status: DC | PRN
  Administered 2018-09-07: 5 mg via INTRAVENOUS
  Administered 2018-09-07: 10 mg via INTRAVENOUS

## 2018-09-07 MED ORDER — GLYCOPYRROLATE 0.2 MG/ML IJ SOLN
INTRAMUSCULAR | Status: AC
  Filled 2018-09-07: qty 2

## 2018-09-07 MED ORDER — DEXAMETHASONE SODIUM PHOSPHATE 10 MG/ML IJ SOLN
INTRAMUSCULAR | Status: AC
  Filled 2018-09-07: qty 2

## 2018-09-07 MED ORDER — FENTANYL CITRATE (PF) 100 MCG/2ML IJ SOLN
INTRAMUSCULAR | Status: DC | PRN
  Administered 2018-09-07 (×3): 50 ug via INTRAVENOUS

## 2018-09-07 MED ORDER — BUPIVACAINE HCL (PF) 0.25 % IJ SOLN
INTRAMUSCULAR | Status: AC
  Filled 2018-09-07: qty 30

## 2018-09-07 MED ORDER — LACTATED RINGERS IV SOLN
INTRAVENOUS | Status: DC
  Administered 2018-09-07 (×2): via INTRAVENOUS

## 2018-09-07 MED ORDER — ACETAMINOPHEN 500 MG PO TABS
1000.0000 mg | ORAL_TABLET | ORAL | Status: AC
  Administered 2018-09-07: 1000 mg via ORAL

## 2018-09-07 MED ORDER — CELECOXIB 200 MG PO CAPS
ORAL_CAPSULE | ORAL | Status: AC
  Administered 2018-09-07: 200 mg via ORAL
  Filled 2018-09-07: qty 1

## 2018-09-07 MED ORDER — FENTANYL CITRATE (PF) 100 MCG/2ML IJ SOLN
INTRAMUSCULAR | Status: AC
  Filled 2018-09-07: qty 4

## 2018-09-07 MED ORDER — LIDOCAINE-EPINEPHRINE 1 %-1:100000 IJ SOLN
INTRAMUSCULAR | Status: AC
  Filled 2018-09-07: qty 1

## 2018-09-07 MED ORDER — DEXAMETHASONE SODIUM PHOSPHATE 10 MG/ML IJ SOLN
INTRAMUSCULAR | Status: DC | PRN
  Administered 2018-09-07: 10 mg via INTRAVENOUS

## 2018-09-07 MED ORDER — PROPOFOL 10 MG/ML IV BOLUS
INTRAVENOUS | Status: AC
  Filled 2018-09-07: qty 40

## 2018-09-07 MED ORDER — EPHEDRINE SULFATE 50 MG/ML IJ SOLN
INTRAMUSCULAR | Status: AC
  Filled 2018-09-07: qty 1

## 2018-09-07 MED ORDER — ONDANSETRON HCL 4 MG/2ML IJ SOLN
INTRAMUSCULAR | Status: DC | PRN
  Administered 2018-09-07 (×2): 4 mg via INTRAVENOUS

## 2018-09-07 MED ORDER — SUGAMMADEX SODIUM 500 MG/5ML IV SOLN
INTRAVENOUS | Status: AC
  Filled 2018-09-07: qty 5

## 2018-09-07 MED ORDER — FAMOTIDINE 20 MG PO TABS
20.0000 mg | ORAL_TABLET | Freq: Once | ORAL | Status: AC
  Administered 2018-09-07: 20 mg via ORAL

## 2018-09-07 MED ORDER — ROCURONIUM BROMIDE 50 MG/5ML IV SOLN
INTRAVENOUS | Status: AC
  Filled 2018-09-07: qty 3

## 2018-09-07 MED ORDER — HYDROCODONE-ACETAMINOPHEN 5-325 MG PO TABS: 1.0000 | ORAL_TABLET | Freq: Four times a day (QID) | ORAL | 0 refills | Status: DC | PRN

## 2018-09-07 MED ORDER — SEVOFLURANE IN SOLN
RESPIRATORY_TRACT | Status: AC
  Filled 2018-09-07: qty 250

## 2018-09-07 MED ORDER — ACETAMINOPHEN 500 MG PO TABS
ORAL_TABLET | ORAL | Status: AC
  Administered 2018-09-07: 1000 mg via ORAL
  Filled 2018-09-07: qty 2

## 2018-09-07 MED ORDER — SUGAMMADEX SODIUM 500 MG/5ML IV SOLN
INTRAVENOUS | Status: DC | PRN
  Administered 2018-09-07: 400 mg via INTRAVENOUS

## 2018-09-07 MED ORDER — MEPERIDINE HCL 50 MG/ML IJ SOLN: 6.2500 mg | INTRAMUSCULAR | Status: DC | PRN

## 2018-09-07 MED ORDER — SODIUM CHLORIDE 0.9 % IV SOLN
INTRAVENOUS | Status: DC | PRN
  Administered 2018-09-07: 15 ug/min via INTRAVENOUS

## 2018-09-07 MED ORDER — CEFAZOLIN SODIUM-DEXTROSE 2-4 GM/100ML-% IV SOLN
INTRAVENOUS | Status: AC
  Filled 2018-09-07: qty 100

## 2018-09-07 MED ORDER — BUPIVACAINE LIPOSOME 1.3 % IJ SUSP
INTRAMUSCULAR | Status: AC
  Filled 2018-09-07: qty 20

## 2018-09-07 MED ORDER — GABAPENTIN 300 MG PO CAPS
ORAL_CAPSULE | ORAL | Status: AC
  Administered 2018-09-07: 300 mg via ORAL
  Filled 2018-09-07: qty 1

## 2018-09-07 MED ORDER — PROMETHAZINE HCL 25 MG/ML IJ SOLN: 6.2500 mg | INTRAMUSCULAR | Status: DC | PRN

## 2018-09-07 MED ORDER — GABAPENTIN 300 MG PO CAPS
300.0000 mg | ORAL_CAPSULE | ORAL | Status: AC
  Administered 2018-09-07: 300 mg via ORAL

## 2018-09-07 MED ORDER — CHLORHEXIDINE GLUCONATE CLOTH 2 % EX PADS: 6.0000 | MEDICATED_PAD | Freq: Once | CUTANEOUS | Status: DC

## 2018-09-07 MED ORDER — BUPIVACAINE LIPOSOME 1.3 % IJ SUSP
INTRAMUSCULAR | Status: DC | PRN
  Administered 2018-09-07: 20 mL

## 2018-09-07 MED ORDER — FENTANYL CITRATE (PF) 100 MCG/2ML IJ SOLN: 25.0000 ug | INTRAMUSCULAR | Status: DC | PRN

## 2018-09-07 MED ORDER — BUPIVACAINE LIPOSOME 1.3 % IJ SUSP: 20.0000 mL | Freq: Once | INTRAMUSCULAR | Status: DC

## 2018-09-07 MED ORDER — OXYCODONE HCL 5 MG PO TABS: 5.0000 mg | ORAL_TABLET | Freq: Once | ORAL | Status: DC | PRN

## 2018-09-07 MED ORDER — CELECOXIB 200 MG PO CAPS
200.0000 mg | ORAL_CAPSULE | ORAL | Status: AC
  Administered 2018-09-07: 200 mg via ORAL

## 2018-09-07 MED ORDER — GLYCOPYRROLATE 0.2 MG/ML IJ SOLN
INTRAMUSCULAR | Status: DC | PRN
  Administered 2018-09-07: 0.2 mg via INTRAVENOUS

## 2018-09-07 MED ORDER — BUPIVACAINE HCL (PF) 0.25 % IJ SOLN
INTRAMUSCULAR | Status: DC | PRN
  Administered 2018-09-07: 7 mL

## 2018-09-07 MED ORDER — LIDOCAINE-EPINEPHRINE 1 %-1:100000 IJ SOLN
INTRAMUSCULAR | Status: DC | PRN
  Administered 2018-09-07: 7 mL

## 2018-09-07 MED ORDER — LIDOCAINE HCL (PF) 2 % IJ SOLN
INTRAMUSCULAR | Status: AC
  Filled 2018-09-07: qty 20

## 2018-09-07 MED ORDER — PROPOFOL 10 MG/ML IV BOLUS
INTRAVENOUS | Status: DC | PRN
  Administered 2018-09-07: 170 mg via INTRAVENOUS

## 2018-09-07 MED ORDER — MIDAZOLAM HCL 2 MG/2ML IJ SOLN
INTRAMUSCULAR | Status: AC
  Filled 2018-09-07: qty 2

## 2018-09-07 MED ORDER — CEFAZOLIN SODIUM-DEXTROSE 2-4 GM/100ML-% IV SOLN
2.0000 g | INTRAVENOUS | Status: AC
  Administered 2018-09-07: 2 g via INTRAVENOUS

## 2018-09-07 MED ORDER — OXYCODONE HCL 5 MG/5ML PO SOLN: 5.0000 mg | Freq: Once | ORAL | Status: DC | PRN

## 2018-09-07 MED ORDER — PHENYLEPHRINE HCL 10 MG/ML IJ SOLN
INTRAMUSCULAR | Status: DC | PRN
  Administered 2018-09-07 (×2): 100 ug via INTRAVENOUS

## 2018-09-07 MED ORDER — FAMOTIDINE 20 MG PO TABS
ORAL_TABLET | ORAL | Status: AC
  Administered 2018-09-07: 20 mg via ORAL
  Filled 2018-09-07: qty 1

## 2018-09-07 MED ORDER — LIDOCAINE HCL (CARDIAC) PF 100 MG/5ML IV SOSY
PREFILLED_SYRINGE | INTRAVENOUS | Status: DC | PRN
  Administered 2018-09-07: 100 mg via INTRAVENOUS

## 2018-09-07 MED ORDER — ROCURONIUM BROMIDE 100 MG/10ML IV SOLN
INTRAVENOUS | Status: DC | PRN
  Administered 2018-09-07: 10 mg via INTRAVENOUS
  Administered 2018-09-07: 50 mg via INTRAVENOUS
  Administered 2018-09-07: 10 mg via INTRAVENOUS

## 2018-09-07 MED ORDER — PHENYLEPHRINE HCL 10 MG/ML IJ SOLN
INTRAMUSCULAR | Status: AC
  Filled 2018-09-07: qty 1

## 2018-09-07 MED ORDER — SUCCINYLCHOLINE CHLORIDE 20 MG/ML IJ SOLN
INTRAMUSCULAR | Status: AC
  Filled 2018-09-07: qty 1

## 2018-09-07 SURGICAL SUPPLY — 35 items
CANISTER SUCT 1200ML W/VALVE (MISCELLANEOUS) ×2 IMPLANT
CHLORAPREP W/TINT 26ML (MISCELLANEOUS) ×2 IMPLANT
COVER WAND RF STERILE (DRAPES) ×2 IMPLANT
DERMABOND ADVANCED (GAUZE/BANDAGES/DRESSINGS) ×1
DERMABOND ADVANCED .7 DNX12 (GAUZE/BANDAGES/DRESSINGS) ×1 IMPLANT
DRAIN PENROSE 5/8X12 LTX STRL (DRAIN) ×2 IMPLANT
DRAPE LAPAROTOMY 77X122 PED (DRAPES) ×2 IMPLANT
ELECT CAUTERY BLADE TIP 2.5 (TIP) ×2
ELECT REM PT RETURN 9FT ADLT (ELECTROSURGICAL) ×2
ELECTRODE CAUTERY BLDE TIP 2.5 (TIP) ×1 IMPLANT
ELECTRODE REM PT RTRN 9FT ADLT (ELECTROSURGICAL) ×1 IMPLANT
GLOVE BIO SURGEON STRL SZ 6.5 (GLOVE) ×2 IMPLANT
GLOVE INDICATOR 7.0 STRL GRN (GLOVE) ×2 IMPLANT
GOWN STRL REUS W/ TWL LRG LVL3 (GOWN DISPOSABLE) ×2 IMPLANT
GOWN STRL REUS W/TWL LRG LVL3 (GOWN DISPOSABLE) ×2
KIT TURNOVER KIT A (KITS) ×2 IMPLANT
LABEL OR SOLS (LABEL) ×2 IMPLANT
MESH HERNIA 6X6 BARD (Mesh General) ×1 IMPLANT
MESH HERNIA BARD 6X6 (Mesh General) ×1 IMPLANT
NEEDLE HYPO 22GX1.5 SAFETY (NEEDLE) ×2 IMPLANT
NS IRRIG 500ML POUR BTL (IV SOLUTION) ×2 IMPLANT
PACK BASIN MINOR ARMC (MISCELLANEOUS) ×2 IMPLANT
SPONGE KITTNER 5P (MISCELLANEOUS) ×2 IMPLANT
STRIP CLOSURE SKIN 1/2X4 (GAUZE/BANDAGES/DRESSINGS) ×2 IMPLANT
SUT ETHIBOND NAB MO 7 #0 18IN (SUTURE) IMPLANT
SUT MNCRL 4-0 (SUTURE) ×1
SUT MNCRL 4-0 27XMFL (SUTURE) ×1
SUT PROLENE 2 0 SH DA (SUTURE) IMPLANT
SUT VIC AB 2-0 SH 27 (SUTURE)
SUT VIC AB 2-0 SH 27XBRD (SUTURE) IMPLANT
SUT VIC AB 3-0 SH 27 (SUTURE) ×1
SUT VIC AB 3-0 SH 27X BRD (SUTURE) ×1 IMPLANT
SUTURE MNCRL 4-0 27XMF (SUTURE) ×1 IMPLANT
SYR 10ML LL (SYRINGE) ×2 IMPLANT
SYR BULB IRRIG 60ML STRL (SYRINGE) ×2 IMPLANT

## 2018-09-07 NOTE — Anesthesia Preprocedure Evaluation (Signed)
Anesthesia Evaluation  Patient identified by MRN, date of birth, ID band Patient awake    Reviewed: Allergy & Precautions, NPO status , Patient's Chart, lab work & pertinent test results  History of Anesthesia Complications Negative for: history of anesthetic complications  Airway Mallampati: II  TM Distance: >3 FB Neck ROM: Full    Dental  (+) Implants   Pulmonary neg sleep apnea, neg COPD, former smoker,    breath sounds clear to auscultation- rhonchi (-) wheezing      Cardiovascular Exercise Tolerance: Good (-) hypertension(-) CAD, (-) Past MI, (-) Cardiac Stents and (-) CABG  Rhythm:Regular Rate:Normal - Systolic murmurs and - Diastolic murmurs    Neuro/Psych neg Seizures negative neurological ROS  negative psych ROS   GI/Hepatic Neg liver ROS, GERD  ,  Endo/Other  negative endocrine ROSneg diabetes  Renal/GU negative Renal ROS     Musculoskeletal  (+) Arthritis ,   Abdominal (+) + obese,   Peds  Hematology negative hematology ROS (+)   Anesthesia Other Findings Past Medical History: No date: Arthritis No date: GERD (gastroesophageal reflux disease) No date: Hyperlipidemia 08/2018: Left inguinal hernia   Reproductive/Obstetrics                             Anesthesia Physical Anesthesia Plan  ASA: II  Anesthesia Plan: General   Post-op Pain Management:    Induction: Intravenous  PONV Risk Score and Plan: 1 and Ondansetron and Dexamethasone  Airway Management Planned: Oral ETT  Additional Equipment:   Intra-op Plan:   Post-operative Plan: Extubation in OR  Informed Consent: I have reviewed the patients History and Physical, chart, labs and discussed the procedure including the risks, benefits and alternatives for the proposed anesthesia with the patient or authorized representative who has indicated his/her understanding and acceptance.     Dental advisory  given  Plan Discussed with: CRNA and Anesthesiologist  Anesthesia Plan Comments:         Anesthesia Quick Evaluation

## 2018-09-07 NOTE — Transfer of Care (Signed)
Immediate Anesthesia Transfer of Care Note  Patient: [name]  Procedure(s) Performed: OPEN LEFT HERNIA REPAIR - INGUINAL WITH MESH (Left ) 2 Patient Location: PACU  Anesthesia Type:General  Level of Consciousness: awake, alert , oriented and patient cooperative  Airway & Oxygen Therapy: Patient Spontanous Breathing  Post-op Assessment: Report given to RN and Post -op Vital signs reviewed and stable  Post vital signs: Reviewed and stable  Last Vitals:  Vitals Value Taken Time  BP 123/85 09/07/2018  9:33 AM  Temp 36.8 C 09/07/2018  9:33 AM  Pulse 71 09/07/2018  9:36 AM  Resp    SpO2 96 % 09/07/2018  9:36 AM  Vitals shown include unvalidated device data.  Last Pain:  Vitals:   09/07/18 0644  TempSrc: Tympanic  PainSc:          Complications: No apparent anesthesia complications

## 2018-09-07 NOTE — Anesthesia Postprocedure Evaluation (Signed)
Anesthesia Post Note  Patient: Alexander Moss  Procedure(s) Performed: OPEN LEFT HERNIA REPAIR - INGUINAL WITH MESH (Left )  Patient location during evaluation: PACU Anesthesia Type: General Level of consciousness: awake and alert and oriented Pain management: pain level controlled Vital Signs Assessment: post-procedure vital signs reviewed and stable Respiratory status: spontaneous breathing, nonlabored ventilation and respiratory function stable Cardiovascular status: blood pressure returned to baseline and stable Postop Assessment: no signs of nausea or vomiting Anesthetic complications: no     Last Vitals:  Vitals:   09/07/18 1040 09/07/18 1048  BP:    Pulse: (!) 58 69  Resp: 13 14  Temp:    SpO2: 95% 95%    Last Pain:  Vitals:   09/07/18 1048  TempSrc:   PainSc: 0-No pain                 Maxine Fredman

## 2018-09-07 NOTE — Interval H&P Note (Signed)
History and Physical Interval Note:  09/07/2018 7:28 AM  Alexander Moss  has presented today for surgery, with the diagnosis of LEFT INGUINAL HERNIA  The various methods of treatment have been discussed with the patient and family. After consideration of risks, benefits and other options for treatment, the patient has consented to  Procedure(s): OPEN LEFT HERNIA REPAIR - INGUINAL WITH MESH (Left) as a surgical intervention .  The patient's history has been reviewed, patient examined, no change in status, stable for surgery.  I have reviewed the patient's chart and labs.  Questions were answered to the patient's satisfaction.     Fredirick Maudlin

## 2018-09-07 NOTE — Op Note (Signed)
Hernia, Open, Procedure Note  Indications: The patient presented with a history of a left, not reducible hernia.    Pre-operative Diagnosis: left not reducible  Post-operative Diagnosis: same  Surgeon: Fredirick Maudlin   Assistants: Arvilla Meres, RNFA  Anesthesia: General endotracheal anesthesia  ASA Class: 2  Procedure Details  The patient was seen again in the Holding Room. The risks, benefits, complications, treatment options, and expected outcomes were discussed with the patient. The possibilities of reaction to medication, pulmonary aspiration, perforation of viscus, bleeding, recurrent infection, the need for additional procedures, and development of a complication requiring transfusion or further operation were discussed with the patient and/or family. There was concurrence with the proposed plan, and informed consent was obtained. The site of surgery was properly noted/marked. The patient was taken to the Operating Room, identified as Alexander Moss, and the procedure verified as hernia repair. A Time Out was held and the above information confirmed.  The patient was placed in the supine position and underwent induction of anesthesia, the lower abdomen and groin was prepped and draped in the standard fashion, and 0.25% bupivicaine mixed 1:1 with 1% lidocaine with epinephrine was used to anesthetize the skin over the mid-portion of the inguinal canal. A transverse incision was made. Dissection was carried through the soft tissue to expose the inguinal canal and inguinal ligament along its lower edge. The external oblique fascia was split along the course of its fibers, exposing the inguinal canal. The cord and nerve were looped using a Penrose drain and reflected out of the field. The defect was exposed and the hernia sac dissected off the cord structures.  The hernia sac was then opened and the hernia contents reduced back into the abdomen.  A pursestring suture was used to close the sac  and the excess tissue was excised and handed off. A piece of mesh was trimmed to size and placed over the defect. Running Ethibond suture was then used in a continuous fashion to suture the mesh in place, with the suture being sewn from the pubic tubercle inferiorly and superiorly along the canal to a level just beyond the internal ring. The mesh was split to allow passage of the cord and nerve into the canal without entrapment.  The mesh was closed around the cord to create an opening just big enough to admit a fingertip.  The contents were then returned to canal and the external oblique fashion was then closed in a continuous fashion using 3-0 Vicryl suture taking care not to cause entrapment.  Scarpa's fascia was then closed.  Exparel was injected around the entire incision under direct vision.  The skin was then closed with running subcuticular Monocryl.  The patient was awakened, extubated, and taken to the postanesthesia care unit in good condition.  Instrument, sponge, and needle counts were correct prior to closure and at the conclusion of the case.  Findings: Hernia as above  Estimated Blood Loss: Minimal         Drains: None         Total IV Fluids: see anesthesia record         Specimens: hernia sac               Complications: None; patient tolerated the procedure well.         Disposition: PACU - hemodynamically stable.         Condition: stable

## 2018-09-07 NOTE — Anesthesia Procedure Notes (Addendum)
Procedure Name: Intubation Date/Time: 09/07/2018 7:43 AM Performed by: Kelton Pillar, CRNA Pre-anesthesia Checklist: Patient identified, Emergency Drugs available, Suction available and Patient being monitored Patient Re-evaluated:Patient Re-evaluated prior to induction Oxygen Delivery Method: Circle system utilized Preoxygenation: Pre-oxygenation with 100% oxygen Induction Type: IV induction Ventilation: Mask ventilation without difficulty Laryngoscope Size: McGraph and 3 Grade View: Grade I Tube type: Oral Tube size: 7.0 mm Number of attempts: 1 Airway Equipment and Method: Stylet Placement Confirmation: ETT inserted through vocal cords under direct vision,  positive ETCO2,  CO2 detector and breath sounds checked- equal and bilateral Secured at: 23 (at the teeth) cm Tube secured with: Tape Dental Injury: Teeth and Oropharynx as per pre-operative assessment

## 2018-09-07 NOTE — Progress Notes (Signed)
Up to chair   Oxygen level 88 to 96   Lungs clear  Blow bottles used

## 2018-09-07 NOTE — Anesthesia Post-op Follow-up Note (Signed)
Anesthesia QCDR form completed.        

## 2018-09-07 NOTE — OR Nursing (Signed)
Per PACU RN on arrival to postop, MD requests pt to void prior to going home.  Discharge pending void.

## 2018-09-07 NOTE — Discharge Instructions (Signed)

## 2018-09-07 NOTE — Addendum Note (Signed)
Addendum  created 09/07/18 1244 by Kelton Pillar, CRNA   Charge Capture section accepted

## 2018-09-10 LAB — SURGICAL PATHOLOGY

## 2018-09-19 ENCOUNTER — Other Ambulatory Visit: Payer: Self-pay

## 2018-09-19 ENCOUNTER — Ambulatory Visit (INDEPENDENT_AMBULATORY_CARE_PROVIDER_SITE_OTHER): Payer: PPO | Admitting: General Surgery

## 2018-09-19 ENCOUNTER — Encounter: Payer: Self-pay | Admitting: General Surgery

## 2018-09-19 VITALS — BP 127/81 | HR 60 | Temp 97.7°F | Resp 16 | Wt 219.8 lb

## 2018-09-19 DIAGNOSIS — Z8719 Personal history of other diseases of the digestive system: Secondary | ICD-10-CM

## 2018-09-19 DIAGNOSIS — Z9889 Other specified postprocedural states: Secondary | ICD-10-CM

## 2018-09-19 NOTE — Progress Notes (Signed)
Alexander Moss is here today for a postoperative visit after undergoing an open left inguinal hernia repair with mesh.  He states that he has done well since his operation.  He has not required narcotic pain medication.  He is eating normally and having regular bowel movements.  Vitals:   09/19/18 1330  BP: 127/81  Pulse: 60  Resp: 16  Temp: 97.7 F (36.5 C)  SpO2: 97%   Focused examination: The Steri-Strips are still in place.  I left them in situ per the patient's request.  The area surrounding the incision does not have any erythema, induration, or drainage.  No evidence of recurrent hernia.  Impression and plan: Alexander Moss is a 73 year old man who underwent an uncomplicated open left inguinal hernia repair.  He has done well since his operation.  I told him to allow the Steri-Strips to fall off on their own, but if they were not off in 1 week, he should remove them himself.  He needs to continue to avoid lifting anything heavier than 10 pounds or otherwise engaging his abdominal musculature for a total of 6 weeks from the time of his operation.  He is otherwise cleared to resume all of his usual activities.  I will see him on an as-needed basis.

## 2018-09-19 NOTE — Patient Instructions (Addendum)
Resume activities as tolerated and no heavy lifting, pulling or sit ups for the next 4 weeks. The patient is aware to call back for any questions or new concerns.  GENERAL POST-OPERATIVE PATIENT INSTRUCTIONS   WOUND CARE INSTRUCTIONS:  Keep a dry clean dressing on the wound if there is drainage. The initial bandage may be removed after 24 hours.  Once the wound has quit draining you may leave it open to air.  If clothing rubs against the wound or causes irritation and the wound is not draining you may cover it with a dry dressing during the daytime.  Try to keep the wound dry and avoid ointments on the wound unless directed to do so.  If the wound becomes bright red and painful or starts to drain infected material that is not clear, please contact your physician immediately.  If the wound is mildly pink and has a thick firm ridge underneath it, this is normal, and is referred to as a healing ridge.  This will resolve over the next 4-6 weeks.  BATHING: You may shower if you have been informed of this by your surgeon. However, Please do not submerge in a tub, hot tub, or pool until incisions are completely sealed or have been told by your surgeon that you may do so.  DIET:  You may eat any foods that you can tolerate.  It is a good idea to eat a high fiber diet and take in plenty of fluids to prevent constipation.  If you do become constipated you may want to take a mild laxative or take ducolax tablets on a daily basis until your bowel habits are regular.  Constipation can be very uncomfortable, along with straining, after recent surgery.  ACTIVITY:  You are encouraged to cough and deep breath or use your incentive spirometer if you were given one, every 15-30 minutes when awake.  This will help prevent respiratory complications and low grade fevers post-operatively if you had a general anesthetic.  You may want to hug a pillow when coughing and sneezing to add additional support to the surgical area, if  you had abdominal or chest surgery, which will decrease pain during these times.  You are encouraged to walk and engage in light activity for the next two weeks.   At that time- Listen to your body when lifting, if you have pain when lifting, stop and then try again in a few days. Soreness after doing exercises or activities of daily living is normal as you get back in to your normal routine.  MEDICATIONS:  Try to take narcotic medications and anti-inflammatory medications, such as tylenol, ibuprofen, naprosyn, etc., with food.  This will minimize stomach upset from the medication.  Should you develop nausea and vomiting from the pain medication, or develop a rash, please discontinue the medication and contact your physician.  You should not drive, make important decisions, or operate machinery when taking narcotic pain medication.  SUNBLOCK Use sun block to incision area over the next year if this area will be exposed to sun. This helps decrease scarring and will allow you avoid a permanent darkened area over your incision.  QUESTIONS:  Please feel free to call our office if you have any questions, and we will be glad to assist you.

## 2018-09-28 DIAGNOSIS — Z96651 Presence of right artificial knee joint: Secondary | ICD-10-CM | POA: Diagnosis not present

## 2018-09-28 DIAGNOSIS — M25562 Pain in left knee: Secondary | ICD-10-CM | POA: Diagnosis not present

## 2018-09-28 DIAGNOSIS — Z471 Aftercare following joint replacement surgery: Secondary | ICD-10-CM | POA: Diagnosis not present

## 2018-09-28 DIAGNOSIS — M1712 Unilateral primary osteoarthritis, left knee: Secondary | ICD-10-CM | POA: Diagnosis not present

## 2018-12-07 ENCOUNTER — Other Ambulatory Visit: Payer: Self-pay

## 2018-12-07 NOTE — Patient Outreach (Signed)
[facility]) Care Management  12/07/2018  JAE SKEET [birth year] [id number]    Late entry.  Successful outreach.  The patient was engaged on 10/17/18 for [facility] HRA screening.  HIPAA provided by the patient.  Per the patient he does not have any conditions that need to be addressed.  He has an advanced directive. Upon completion of the assessment no further interventions needed.  [facility] services was explained to the patient and he agreed to a six month follow up.  Plan:  RN Health Coach will place the patient in a HRA Engaged program and follow up in six months. The patient has been mailed a successful letter and will send a Production assistant, radio.  [name] RN, BSN, [place] Direct Dial:  [phone]  Fax: [phone]

## 2018-12-11 ENCOUNTER — Ambulatory Visit: Payer: Self-pay | Admitting: Family Medicine

## 2018-12-24 NOTE — H&P (Signed)
TOTAL KNEE ADMISSION H&P  Patient is being admitted for left total knee arthroplasty.  Subjective:  Chief Complaint:left knee pain.  HPI: Alexander Moss, 73 y.o. male, has a history of pain and functional disability in the left knee due to arthritis and has failed non-surgical conservative treatments for greater than 12 weeks to includecorticosteriod injections and activity modification.  Onset of symptoms was gradual, starting several years ago with gradually worsening course since that time. The patient noted prior procedures on the knee to include  arthroscopy on the left knee(s).  Patient currently rates pain in the left knee(s) at 5 out of 10 with activity. Patient has worsening of pain with activity and weight bearing, pain that interferes with activities of daily living and crepitus.  Patient has evidence of bone-on-bone osteoarthritis of the medial and patellofemoral compartments by imaging studies. There is no active infection.  Patient Active Problem List   Diagnosis Date Noted   Non-recurrent unilateral inguinal hernia without obstruction or gangrene 08/29/2018   OA (osteoarthritis) of knee 05/23/2016   Hearing loss 01/06/2015   Chronic back pain 12/09/2014   GERD (gastroesophageal reflux disease) 12/09/2014   Obesity 12/09/2014   PSA Elevation 12/09/2014   Rosacea 12/09/2014   Family history of GI tract cancer 07/09/2008   Lumbago 06/25/2007   Diverticulosis of colon without hemorrhage 09/08/2006   Past Medical History:  Diagnosis Date   Arthritis    GERD (gastroesophageal reflux disease)    Hyperlipidemia    Left inguinal hernia 08/2018    Past Surgical History:  Procedure Laterality Date   HEMORRHOID SURGERY  1964   INGUINAL HERNIA REPAIR Left 09/07/2018   Procedure: OPEN LEFT HERNIA REPAIR - INGUINAL WITH MESH;  Surgeon: Fredirick Maudlin, MD;  Location: [facility] ORS;  Service: General;  Laterality: Left;   JOINT REPLACEMENT     KNEE SURGERY  Bilateral 2003   TONSILLECTOMY AND ADENOIDECTOMY     TOTAL KNEE ARTHROPLASTY Right 05/23/2016   Procedure: RIGHT TOTAL KNEE ARTHROPLASTY;  Surgeon: Gaynelle Arabian, MD;  Location: [facility] ORS;  Service: Orthopedics;  Laterality: Right;    No current facility-administered medications for this encounter.    Current Outpatient Medications  Medication Sig Dispense Refill Last Dose   Calcium Carb-Cholecalciferol (CALCIUM 600 + D PO) Take 1 tablet by mouth daily at 12 noon.   Taking   calcium carbonate (TUMS EX) 750 MG chewable tablet Chew 750-1,500 mg by mouth daily as needed for heartburn.   Taking   fexofenadine (ALLEGRA) 180 MG tablet Take 180 mg by mouth daily.   Taking   naproxen sodium (ALEVE) 220 MG tablet Take 220 mg by mouth 2 (two) times daily.    Taking   Omega-3 Fatty Acids (FISH OIL ULTRA) 1400 MG CAPS Take 1,400 mg by mouth daily.   Taking   tamsulosin (FLOMAX) 0.4 MG CAPS capsule TAKE 1 CAPSULE(0.4 MG) BY MOUTH DAILY (Patient taking differently: Take 0.4 mg by mouth daily at 12 noon. TAKE 1 CAPSULE(0.4 MG) BY MOUTH DAILY) 90 capsule 4 Taking   vitamin B-12 (CYANOCOBALAMIN) 1000 MCG tablet Take 1,000 mcg by mouth daily at 12 noon.   Taking   Vitamin E 400 units TABS Take 400 Units by mouth daily.    Taking   Zinc 25 MG TABS Take 25 mg by mouth daily.    Taking   No Known Allergies  Social History   Tobacco Use   Smoking status: Former Smoker    Packs/day: 2.00    Quit  date: 05/30/1980    Years since quitting: 38.5   Smokeless tobacco: Never Used   Tobacco comment: remote smoking history, quit in 1981  Substance Use Topics   Alcohol use: Yes    Alcohol/week: 1.0 - 3.0 standard drinks    Types: 1 - 3 Standard drinks or equivalent per week    Comment: beer or wine    Family History  Problem Relation Age of Onset   Diabetes Mother    Lung cancer Father    Liver cancer Sister      Review of Systems  Constitutional: Negative for chills and fever.  HENT:  Negative for congestion, sore throat and tinnitus.   Eyes: Negative for double vision, photophobia and pain.  Respiratory: Negative for cough, shortness of breath and wheezing.   Cardiovascular: Negative for chest pain, palpitations and orthopnea.  Gastrointestinal: Negative for heartburn, nausea and vomiting.  Genitourinary: Negative for dysuria, frequency and urgency.  Musculoskeletal: Positive for joint pain.  Neurological: Negative for dizziness, weakness and headaches.    Objective:  Physical Exam  Well nourished and well developed.  General: Alert and oriented x3, cooperative and pleasant, no acute distress.  Head: normocephalic, atraumatic, neck supple.  Eyes: EOMI.  Respiratory: breath sounds clear in all fields, no wheezing, rales, or rhonchi. Cardiovascular: Regular rate and rhythm, no murmurs, gallops or rubs.  Abdomen: non-tender to palpation and soft, normoactive bowel sounds. Musculoskeletal:  Left Knee Exam:  No effusion. No Swelling. Range of motion is 0-125 degrees.  Moderate crepitus on range of motion of the knee.  Significant medial joint line tenderness No lateral joint line tenderness.  Stable knee.  Calves soft and nontender. Motor function intact in LE. Strength 5/5 LE bilaterally. Neuro: Distal pulses 2+. Sensation to light touch intact in LE.  Vital signs in last 24 hours: Blood pressure: 140/80 mmHg Pulse: 60 bpm  Labs:   Estimated body mass index is 33.92 kg/m as calculated from the following:   Height as of 09/07/18: 5' 7.5" (1.715 m).   Weight as of 09/19/18: 99.7 kg.   Imaging Review Plain radiographs demonstrate severe degenerative joint disease of the left knee(s). The overall alignment isneutral. The bone quality appears to be adequate for age and reported activity level.      Assessment/Plan:  End stage arthritis, left knee   The patient history, physical examination, clinical judgment of the provider and imaging studies are  consistent with end stage degenerative joint disease of the left knee(s) and total knee arthroplasty is deemed medically necessary. The treatment options including medical management, injection therapy arthroscopy and arthroplasty were discussed at length. The risks and benefits of total knee arthroplasty were presented and reviewed. The risks due to aseptic loosening, infection, stiffness, patella tracking problems, thromboembolic complications and other imponderables were discussed. The patient acknowledged the explanation, agreed to proceed with the plan and consent was signed. Patient is being admitted for inpatient treatment for surgery, pain control, PT, OT, prophylactic antibiotics, VTE prophylaxis, progressive ambulation and ADL's and discharge planning. The patient is planning to be discharged home.  Anticipated LOS equal to or greater than 2 midnights due to - Age 70 and older with one or more of the following:  - Obesity  - Expected need for hospital services (PT, OT, Nursing) required for safe  discharge  - Anticipated need for postoperative skilled nursing care or inpatient rehab  - Active co-morbidities: None OR   - Unanticipated findings during/Post Surgery: None  - Patient is a  high risk of re-admission due to: None  Therapy Plans: outpatient therapy at [facility] in [place] Disposition: Home with wife Planned DVT Prophylaxis: Aspirin 325 mg BID DME needed: None PCP: Dr. Caryn Section TXA: IV Allergies: NKDA Anesthesia Concerns: None BMI: 32.9 Other: Had issues with UTI following previous right TKA  - Patient was instructed on what medications to stop prior to surgery. - Follow-up visit in 2 weeks with Dr. Wynelle Link - Begin physical therapy following surgery - Pre-operative lab work as pre-surgical testing - Prescriptions will be provided in hospital at time of discharge  Theresa Duty, PA-C Orthopedic Surgery [facility] [place]

## 2018-12-28 ENCOUNTER — Other Ambulatory Visit: Payer: Self-pay

## 2018-12-28 ENCOUNTER — Encounter: Payer: Self-pay | Admitting: Family Medicine

## 2018-12-28 ENCOUNTER — Ambulatory Visit (INDEPENDENT_AMBULATORY_CARE_PROVIDER_SITE_OTHER): Payer: PPO | Admitting: Family Medicine

## 2018-12-28 VITALS — BP 108/62 | HR 72 | Temp 98.0°F | Resp 16 | Wt 226.0 lb

## 2018-12-28 DIAGNOSIS — Z01818 Encounter for other preprocedural examination: Secondary | ICD-10-CM | POA: Diagnosis not present

## 2018-12-28 NOTE — Progress Notes (Signed)
Patient: Alexander Moss Male    DOB: [birth year]   73 y.o.   MRN: [id number] Visit Date: 12/28/2018  Today's Provider: Lelon Huh, MD   Chief Complaint  Patient presents with  . Medical Clearance   Subjective:     HPI   Pt is presents  today to be cleared for surgery.  He is scheduled for a left knee replacement on 01/07/2019.  He has no complaints aside from knee pain. Denies chest pains, palpations, and dyspnea. Has had several surgeries in the past with no surgical or anesthetic complications.   Past Surgical History:  Procedure Laterality Date  . [place]  . INGUINAL HERNIA REPAIR Left 09/07/2018   Procedure: OPEN LEFT HERNIA REPAIR - INGUINAL WITH MESH;  Surgeon: Fredirick Maudlin, MD;  Location: [facility] ORS;  Service: General;  Laterality: Left;  . JOINT REPLACEMENT    . KNEE SURGERY Bilateral 2003  . TONSILLECTOMY AND ADENOIDECTOMY    . TOTAL KNEE ARTHROPLASTY Right 05/23/2016   Procedure: RIGHT TOTAL KNEE ARTHROPLASTY;  Surgeon: Gaynelle Arabian, MD;  Location: [facility]S;  Service: Orthopedics;  Laterality: Right;        No Known Allergies   Current Outpatient Medications:  .  Calcium Carb-Cholecalciferol (CALCIUM 600 + D PO), Take 1 tablet by mouth daily at 12 noon., Disp: , Rfl:  .  calcium carbonate (TUMS EX) 750 MG chewable tablet, Chew 750-1,500 mg by mouth daily as needed for heartburn., Disp: , Rfl:  .  fexofenadine (ALLEGRA) 180 MG tablet, Take 180 mg by mouth daily., Disp: , Rfl:  .  naproxen sodium (ALEVE) 220 MG tablet, Take 220 mg by mouth 2 (two) times daily. , Disp: , Rfl:  .  Omega-3 Fatty Acids (FISH OIL ULTRA) 1400 MG CAPS, Take 1,400 mg by mouth daily., Disp: , Rfl:  .  tamsulosin (FLOMAX) 0.4 MG CAPS capsule, TAKE 1 CAPSULE(0.4 MG) BY MOUTH DAILY (Patient taking differently: Take 0.4 mg by mouth daily at 12 noon. TAKE 1 CAPSULE(0.4 MG) BY MOUTH DAILY), Disp: 90 capsule, Rfl: 4 .  vitamin B-12 (CYANOCOBALAMIN) 1000 MCG tablet, Take  1,000 mcg by mouth daily at 12 noon., Disp: , Rfl:  .  Vitamin E 400 units TABS, Take 400 Units by mouth daily. , Disp: , Rfl:  .  Zinc 25 MG TABS, Take 25 mg by mouth daily. , Disp: , Rfl:   Review of Systems  Constitutional: Negative.   Respiratory: Negative.   Cardiovascular: Negative.   Gastrointestinal: Negative.   Neurological: Negative for dizziness, light-headedness and headaches.    Social History   Tobacco Use  . Smoking status: Former Smoker    Packs/day: 2.00    Quit date: 05/30/1980    Years since quitting: 38.6  . Smokeless tobacco: Never Used  . Tobacco comment: remote smoking history, quit in 1981  Substance Use Topics  . Alcohol use: Yes    Alcohol/week: 1.0 - 3.0 standard drinks    Types: 1 - 3 Standard drinks or equivalent per week    Comment: beer or wine      Objective:   BP 108/62 (BP Location: Right Arm, Patient Position: Sitting, Cuff Size: Large)   Pulse 72   Temp 98 F (36.7 C) (Oral)   Resp 16   Wt 226 lb (102.5 kg)   BMI 34.87 kg/m  Vitals:   12/28/18 1336  BP: 108/62  Pulse: 72  Resp: 16  Temp: 98 F (36.7  C)  TempSrc: Oral  Weight: 226 lb (102.5 kg)     Physical Exam   General Appearance:    Alert, cooperative, no distress, appears stated age  Head:    Normocephalic, without obvious abnormality, atraumatic  Eyes:    PERRL, conjunctiva/corneas clear, EOM's intact, fundi    benign, both eyes       Ears:    Normal TM's and external ear canals, both ears  Nose:   Nares normal, septum midline, mucosa normal, no drainage   or sinus tenderness  Throat:   Lips, mucosa, and tongue normal; teeth and gums normal  Neck:   Supple, symmetrical, trachea midline, no adenopathy;       thyroid:  No enlargement/tenderness/nodules; no carotid   bruit or JVD  Back:     Symmetric, no curvature, ROM normal, no CVA tenderness  Lungs:     Clear to auscultation bilaterally, respirations unlabored  Chest wall:    No tenderness or deformity  Heart:     Regular rate and rhythm, S1 and S2 normal, no murmur, rub   or gallop  Abdomen:     Soft, non-tender, bowel sounds active all four quadrants,    no masses, no organomegaly  Genitalia:    deferred  Rectal:    deferred  Extremities:   Extremities normal, atraumatic, no cyanosis or edema  Pulses:   2+ and symmetric all extremities  Skin:   Skin color, texture, turgor normal, no rashes or lesions  Lymph nodes:   Cervical, supraclavicular, and axillary nodes normal  Neurologic:   CNII-XII intact. Normal strength, sensation and reflexes      throughout       Assessment & Plan    1. Preop general physical exam Patient has no absolute or relative contraindications to planned surgery and anesthesia including general anesthesia. He is at low risk for cardiac and pulmonary complications. Anticipates pre-op labs being done at upcoming appointment. If labs normal then no additional medical precautions recommended.       Lelon Huh, MD  [facility]

## 2019-01-02 NOTE — Patient Instructions (Addendum)
Alexander Moss     Your procedure is scheduled on: Monday 01/07/2019   Report to [facility] Main  Entrance Report to admitting at  08:35  AM               YOU NEED TO HAVE A COVID 19 TEST ON_6/25______ @_______ , THIS TEST MUST BE DONE BEFORE SURGERY, COME TO [place].             ONCE YOUR COVID TEST IS COMPLETED, PLEASE BEGIN THE QUARANTINE INSTRUCTIONS AS OUTLINED IN YOUR HANDOUT.   Call this number if you have problems the morning of surgery [phone]    Remember: Do not eat food after Midnight.              NO SOLID FOOD AFTER MIDNIGHT THE NIGHT PRIOR TO SURGERY . NOTHING BY MOUTH EXCEPT CLEAR LIQUIDS UNTIL 8:00 am PRIOR TO [place].             PLEASE FINISH ENSURE DRINK PER SURGEON ORDER 3 HOURS PRIOR TO SCHEDULED SURGERY TIME WHICH NEEDS TO BE COMPLETED AT _8:00 am.   CLEAR LIQUID DIET   Foods Allowed                                                                     Foods Excluded  Coffee and tea, regular and decaf                             liquids that you cannot  Plain Jell-O in any flavor                                             see through such as: Fruit ices (not with fruit pulp)                                     milk, soups, orange juice  Iced Popsicles                                    All solid food Carbonated beverages, regular and diet                                    Cranberry, grape and apple juices Sports drinks like Gatorade Lightly seasoned clear broth or consume(fat free) Sugar, honey syrup   ____________________________________________________________________                BRUSH YOUR TEETH MORNING OF SURGERY AND RINSE YOUR MOUTH OUT, NO CHEWING GUM CANDY OR MINTS.     Take these medicines the morning of surgery with A SIP OF WATER                                                                       :  Fexofenadine (Allegra), Tamulosin (Flomax)                                 You may not have any metal on your body including piercings             Do not wear jewelry,, lotions, powders or  deodorant                         Men may shave face and neck.   Do not bring valuables to the hospital. Okay.  Contacts, dentures or bridgework may not be worn into surgery.                    Please read over the following fact sheets you were given: _____________________________________________________________________             [facility] - Preparing for Surgery  Before surgery, you can play an important role.   Because skin is not sterile, your skin needs to be as free of germs as possible .  You can reduce the number of germs on your skin by washing with CHG (chlorahexidine gluconate) soap before surgery.  CHG is an antiseptic cleaner which kills germs and bonds with the skin to continue killing germs even after washing. Please DO NOT use if you have an allergy to CHG or antibacterial soaps.   If your skin becomes reddened/irritated stop using the CHG and inform your nurse when you arrive at Short Stay. .  You may shave your face/neck. Please follow these instructions carefully:  1.  Shower with CHG Soap the night before surgery and the  morning of Surgery.  2.  If you choose to wash your hair, wash your hair first as usual with your  normal  shampoo.  3.  After you shampoo, rinse your hair and body thoroughly to remove the  shampoo.                                        4.  Use CHG as you would any other liquid soap.  You can apply chg directly  to the skin and wash                       Gently with a scrungie or clean washcloth.  5.  Apply the CHG Soap to your body ONLY FROM THE NECK DOWN.   Do not use on face/ open                           Wound or open sores. Avoid contact with eyes, ears mouth and genitals (private parts).                       Wash face,  Genitals (private parts) with  your normal soap.             6.  Wash thoroughly, paying special attention to the area where your surgery  will be performed.  7.  Thoroughly rinse your body with warm water from the neck down.  8.  DO NOT shower/wash with your normal soap after  using and rinsing off  the CHG Soap.              9.  Pat yourself dry with a clean towel.            10.  Wear clean pajamas.            11.  Place clean sheets on your bed the night of your first shower and do not  sleep with pets . Day of Surgery : Do not apply any lotions/deodorants the morning of surgery.  Please wear clean clothes to the hospital/surgery center.   FAILURE TO FOLLOW THESE INSTRUCTIONS MAY RESULT IN THE CANCELLATION OF YOUR SURGERY PATIENT SIGNATURE_________________________________  NURSE SIGNATURE__________________________________  ________________________________________________________________________   Alexander Moss  An incentive spirometer is a tool that can help keep your lungs clear and active. This tool measures how well you are filling your lungs with each breath. Taking long deep breaths may help reverse or decrease the chance of developing breathing (pulmonary) problems (especially infection) following:  A long period of time when you are unable to move or be active. BEFORE THE PROCEDURE   If the spirometer includes an indicator to show your best effort, your nurse or respiratory therapist will set it to a desired goal.  If possible, sit up straight or lean slightly forward. Try not to slouch.  Hold the incentive spirometer in an upright position. INSTRUCTIONS FOR USE  1. Sit on the edge of your bed if possible, or sit up as far as you can in bed or on a chair. 2. Hold the incentive spirometer in an upright position. 3. Breathe out normally. 4. Place the mouthpiece in your mouth and seal your lips tightly around it. 5. Breathe in slowly and as deeply as possible, raising the piston or the ball  toward the top of the column. 6. Hold your breath for 3-5 seconds or for as long as possible. Allow the piston or ball to fall to the bottom of the column. 7. Remove the mouthpiece from your mouth and breathe out normally. 8. Rest for a few seconds and repeat Steps 1 through 7 at least 10 times every 1-2 hours when you are awake. Take your time and take a few normal breaths between deep breaths. 9. The spirometer may include an indicator to show your best effort. Use the indicator as a goal to work toward during each repetition. 10. After each set of 10 deep breaths, practice coughing to be sure your lungs are clear. If you have an incision (the cut made at the time of surgery), support your incision when coughing by placing a pillow or rolled up towels firmly against it. Once you are able to get out of bed, walk around indoors and cough well. You may stop using the incentive spirometer when instructed by your caregiver.  RISKS AND COMPLICATIONS  Take your time so you do not get dizzy or light-headed.  If you are in pain, you may need to take or ask for pain medication before doing incentive spirometry. It is harder to take a deep breath if you are having pain. AFTER USE  Rest and breathe slowly and easily.  It can be helpful to keep track of a log of your progress. Your caregiver can provide you with a simple table to help with this. If you are using the spirometer at home, follow these instructions: [place] IF:   You are having difficultly using the spirometer.  You have trouble  using the spirometer as often as instructed.  Your pain medication is not giving enough relief while using the spirometer.  You develop fever of 100.5 F (38.1 C) or higher. SEEK IMMEDIATE MEDICAL CARE IF:   You cough up bloody sputum that had not been present before.  You develop fever of 102 F (38.9 C) or greater.  You develop worsening pain at or near the incision site. MAKE SURE YOU:    Understand these instructions.  Will watch your condition.  Will get help right away if you are not doing well or get worse. Document Released: 11/07/2006 Document Revised: 09/19/2011 Document Reviewed: 01/08/2007 [facility] Patient Information 2014 [place], [place].   ________________________________________________________________________ . If you are admitted to the hospital after surgery: o The goal for blood sugar control after surgery is 80-180 mg/dL. . Do not take oral diabetes medicines (pills) the morning of surgery.  . THE NIGHT BEFORE SURGERY, take     units of       insulin.      . If your CBG is greater than 220 mg/dL, you may take  of your sliding scale  . (correction) dose of insulin.    For patients with insulin pumps: Contact your diabetes doctor for specific instructions before surgery. Decrease basal rates by 20% at midnight the night before your surgery. Note that if your surgery is planned to be longer than 2 hours, your insulin pump will be removed and intravenous (IV) insulin will be started and managed by the nurses and the anesthesiologist. You will be able to restart your insulin pump once you are awake and able to manage it.  Make sure to bring insulin pump supplies to the hospital with you in case the  site needs to be changed.  Patient Signature:  Date:   Nurse Signature:  Date:   Reviewed and Endorsed by [facility] Patient Education Committee, August 2015 WHAT IS A BLOOD TRANSFUSION? Blood Transfusion Information  A transfusion is the replacement of blood or some of its parts. Blood is made up of multiple cells which provide different functions.  Red blood cells carry oxygen and are used for blood loss replacement.  White blood cells fight against infection.  Platelets control bleeding.  Plasma helps clot blood.  Other blood products are available for specialized needs, such as hemophilia or other clotting disorders. BEFORE THE TRANSFUSION   Who gives blood for transfusions?   Healthy volunteers who are fully evaluated to make sure their blood is safe. This is blood bank blood. Transfusion therapy is the safest it has ever been in the practice of medicine. Before blood is taken from a donor, a complete history is taken to make sure that person has no history of diseases nor engages in risky social behavior (examples are intravenous drug use or sexual activity with multiple partners). The donor's travel history is screened to minimize risk of transmitting infections, such as malaria. The donated blood is tested for signs of infectious diseases, such as HIV and hepatitis. The blood is then tested to be sure it is compatible with you in order to minimize the chance of a transfusion reaction. If you or a relative donates blood, this is often done in anticipation of surgery and is not appropriate for emergency situations. It takes many days to process the donated blood. RISKS AND COMPLICATIONS Although transfusion therapy is very safe and saves many lives, the main dangers of transfusion include:   Getting an infectious disease.  Developing a transfusion  reaction. This is an allergic reaction to something in the blood you were given. Every precaution is taken to prevent this. The decision to have a blood transfusion has been considered carefully by your caregiver before blood is given. Blood is not given unless the benefits outweigh the risks. AFTER THE TRANSFUSION  Right after receiving a blood transfusion, you will usually feel much better and more energetic. This is especially true if your red blood cells have gotten low (anemic). The transfusion raises the level of the red blood cells which carry oxygen, and this usually causes an energy increase.  The nurse administering the transfusion will monitor you carefully for complications. HOME CARE INSTRUCTIONS  No special instructions are needed after a transfusion. You may find your energy  is better. Speak with your caregiver about any limitations on activity for underlying diseases you may have. SEEK MEDICAL CARE IF:   Your condition is not improving after your transfusion.  You develop redness or irritation at the intravenous (IV) site. SEEK IMMEDIATE MEDICAL CARE IF:  Any of the following symptoms occur over the next 12 hours:  Shaking chills.  You have a temperature by mouth above 102 F (38.9 C), not controlled by medicine.  Chest, back, or muscle pain.  People around you feel you are not acting correctly or are confused.  Shortness of breath or difficulty breathing.  Dizziness and fainting.  You get a rash or develop hives.  You have a decrease in urine output.  Your urine turns a dark color or changes to pink, red, or brown. Any of the following symptoms occur over the next 10 days:  You have a temperature by mouth above 102 F (38.9 C), not controlled by medicine.  Shortness of breath.  Weakness after normal activity.  The white part of the eye turns yellow (jaundice).  You have a decrease in the amount of urine or are urinating less often.  Your urine turns a dark color or changes to pink, red, or brown. Document Released: 06/24/2000 Document Revised: 09/19/2011 Document Reviewed: 02/11/2008 [facility] Patient Information 2014 [place], [place].  _______________________________________________________________________

## 2019-01-02 NOTE — Patient Instructions (Signed)
.   Please review the attached list of medications and notify my office if there are any errors.   . Please bring all of your medications to every appointment so we can make sure that our medication list is the same as yours.   

## 2019-01-02 NOTE — Progress Notes (Signed)
08/24/2018- noted in Epic- EKG

## 2019-01-03 ENCOUNTER — Other Ambulatory Visit (HOSPITAL_COMMUNITY)
Admission: RE | Admit: 2019-01-03 | Discharge: 2019-01-03 | Disposition: A | Discharge status: Home / Self Care | Payer: PPO | Source: Ambulatory Visit

## 2019-01-03 ENCOUNTER — Encounter (HOSPITAL_COMMUNITY)
Admission: RE | Admit: 2019-01-03 | Discharge: 2019-01-03 | Disposition: A | Discharge status: Home / Self Care | Payer: PPO | Source: Ambulatory Visit | Attending: Orthopedic Surgery | Admitting: Orthopedic Surgery

## 2019-01-03 ENCOUNTER — Other Ambulatory Visit: Payer: Self-pay

## 2019-01-03 ENCOUNTER — Encounter (HOSPITAL_COMMUNITY): Payer: Self-pay

## 2019-01-03 DIAGNOSIS — Z01812 Encounter for preprocedural laboratory examination: Secondary | ICD-10-CM | POA: Insufficient documentation

## 2019-01-03 DIAGNOSIS — Z87891 Personal history of nicotine dependence: Secondary | ICD-10-CM | POA: Insufficient documentation

## 2019-01-03 DIAGNOSIS — E785 Hyperlipidemia, unspecified: Secondary | ICD-10-CM | POA: Insufficient documentation

## 2019-01-03 DIAGNOSIS — Z1159 Encounter for screening for other viral diseases: Secondary | ICD-10-CM | POA: Insufficient documentation

## 2019-01-03 DIAGNOSIS — M1712 Unilateral primary osteoarthritis, left knee: Secondary | ICD-10-CM | POA: Diagnosis not present

## 2019-01-03 DIAGNOSIS — M1990 Unspecified osteoarthritis, unspecified site: Secondary | ICD-10-CM | POA: Diagnosis not present

## 2019-01-03 LAB — COMPREHENSIVE METABOLIC PANEL
ALT: 21 U/L (ref 0–44)
AST: 22 U/L (ref 15–41)
Albumin: 4 g/dL (ref 3.5–5.0)
Alkaline Phosphatase: 55 U/L (ref 38–126)
Anion gap: 6 (ref 5–15)
BUN: 20 mg/dL (ref 8–23)
CO2: 25 mmol/L (ref 22–32)
Calcium: 9 mg/dL (ref 8.9–10.3)
Chloride: 106 mmol/L (ref 98–111)
Creatinine, Ser: 1.03 mg/dL (ref 0.61–1.24)
GFR calc Af Amer: >60 mL/min (ref >60)
GFR calc non Af Amer: >60 mL/min (ref >60)
Glucose, Bld: 92 mg/dL (ref 70–99)
Potassium: 4.3 mmol/L (ref 3.5–5.1)
Sodium: 137 mmol/L (ref 135–145)
Total Bilirubin: 0.8 mg/dL (ref 0.3–1.2)
Total Protein: 7.4 g/dL (ref 6.5–8.1)

## 2019-01-03 LAB — CBC
HCT: 43.2 % (ref 39.0–52.0)
Hemoglobin: 14.9 g/dL (ref 13.0–17.0)
MCH: 34.4 pg — ABNORMAL HIGH (ref 26.0–34.0)
MCHC: 34.5 g/dL (ref 30.0–36.0)
MCV: 99.8 fL (ref 80.0–100.0)
Platelets: 218 10*3/uL (ref 150–400)
RBC: 4.33 MIL/uL (ref 4.22–5.81)
RDW: 12.7 % (ref 11.5–15.5)
WBC: 7.8 10*3/uL (ref 4.0–10.5)
nRBC: 0 % (ref 0.0–0.2)

## 2019-01-03 LAB — SURGICAL PCR SCREEN
MRSA, PCR: NEGATIVE
Staphylococcus aureus: NEGATIVE

## 2019-01-03 LAB — SARS CORONAVIRUS 2 (TAT 6-24 HRS): SARS Coronavirus 2: NEGATIVE

## 2019-01-03 LAB — PROTIME-INR
INR: 0.9 (ref 0.8–1.2)
Prothrombin Time: 12.1 seconds (ref 11.4–15.2)

## 2019-01-03 LAB — APTT: aPTT: 33 seconds (ref 24–36)

## 2019-01-04 NOTE — Anesthesia Preprocedure Evaluation (Addendum)
Anesthesia Evaluation  Patient identified by MRN, date of birth, ID band Patient awake    Reviewed: Allergy & Precautions, NPO status , Patient's Chart, lab work & pertinent test results  Airway Mallampati: II  TM Distance: >3 FB Neck ROM: Full    Dental no notable dental hx. (+) Teeth Intact   Pulmonary former smoker,    Pulmonary exam normal breath sounds clear to auscultation       Cardiovascular negative cardio ROS Normal cardiovascular exam Rhythm:Regular Rate:Normal     Neuro/Psych negative neurological ROS  negative psych ROS   GI/Hepatic Neg liver ROS, GERD  ,  Endo/Other  negative endocrine ROS  Renal/GU negative Renal ROS     Musculoskeletal  (+) Arthritis ,   Abdominal   Peds  Hematology negative hematology ROS (+)   Anesthesia Other Findings   Reproductive/Obstetrics                           Lab Results  Component Value Date   WBC 7.8 01/03/2019   HGB 14.9 01/03/2019   HCT 43.2 01/03/2019   MCV 99.8 01/03/2019   PLT 218 01/03/2019   Lab Results  Component Value Date   CREATININE 1.03 01/03/2019   BUN 20 01/03/2019   NA 137 01/03/2019   K 4.3 01/03/2019   CL 106 01/03/2019   CO2 25 01/03/2019     Anesthesia Physical Anesthesia Plan  ASA: II  Anesthesia Plan: Spinal   Post-op Pain Management:  Regional for Post-op pain   Induction:   PONV Risk Score and Plan: 2 and Treatment may vary due to age or medical condition and Ondansetron  Airway Management Planned: Natural Airway and Nasal Cannula  Additional Equipment:   Intra-op Plan:   Post-operative Plan: Extubation in OR  Informed Consent: I have reviewed the patients History and Physical, chart, labs and discussed the procedure including the risks, benefits and alternatives for the proposed anesthesia with the patient or authorized representative who has indicated his/her understanding and acceptance.        Plan Discussed with: CRNA  Anesthesia Plan Comments: (See PAT note 01/03/2019, Konrad Felix, PA-C  Spinal w L adductor canal block)     Anesthesia Quick Evaluation

## 2019-01-04 NOTE — Progress Notes (Signed)
Anesthesia Chart Review   Case: [id number] Date/Time: 01/07/19 1015   Procedure: TOTAL KNEE ARTHROPLASTY (Left ) - 69min   Anesthesia type: Choice   Pre-op diagnosis: left knee osteoarthritis   Location: [facility] ROOM 10 / [facility]   Surgeon: Gaynelle Arabian, MD      DISCUSSION: 73 y.o. former smoker (05/30/80) with h/o GERD, HLD, left knee OA scheduled for above procedure 01/07/2019 with Dr. Gaynelle Arabian.   Pt seen by PCP, Dr. Alan Mulder, 12/28/2018 for preoperative evaluation.  Per OV note, "Patient has no absolute or relative contraindications to planned surgery and anesthesia including general anesthesia. He is at low risk for cardiac and pulmonary complications. Anticipates pre-op labs being done at upcoming appointment. If labs normal then no additional medical precautions recommended."  Anticipate pt can proceed with planned procedure barring acute status change.   VS: BP (!) 148/77 (BP Location: Right Arm)   Pulse 60   Temp 36.7 C (Oral)   Resp 18   Ht 5\' 7"  (1.702 m)   Wt 102.3 kg   SpO2 96%   BMI 35.31 kg/m   PROVIDERS: Birdie Sons, MD is PCP    LABS: Labs reviewed: Acceptable for surgery. (all labs ordered are listed, but only abnormal results are displayed)  Labs Reviewed  CBC - Abnormal; Notable for the following components:      Result Value   MCH 34.4 (*)    All other components within normal limits  SURGICAL PCR SCREEN  APTT  COMPREHENSIVE METABOLIC PANEL  PROTIME-INR  TYPE AND SCREEN     IMAGES:   EKG: 08/24/2018 Rate 61 bpm Sinus rhythm with 1st degree AV block  Otherwise normal ECG No previous ECGs available  CV:  Past Medical History:  Diagnosis Date  . Arthritis   . GERD (gastroesophageal reflux disease)   . Hyperlipidemia   . Left inguinal hernia 08/2018    Past Surgical History:  Procedure Laterality Date  . [place]  . INGUINAL HERNIA REPAIR Left 09/07/2018   Procedure: OPEN LEFT HERNIA REPAIR - INGUINAL WITH  MESH;  Surgeon: Fredirick Maudlin, MD;  Location: [facility] ORS;  Service: General;  Laterality: Left;  . JOINT REPLACEMENT Right 2017   Knee  . KNEE SURGERY Bilateral 2003  . TONSILLECTOMY AND ADENOIDECTOMY    . TOTAL KNEE ARTHROPLASTY Right 05/23/2016   Procedure: RIGHT TOTAL KNEE ARTHROPLASTY;  Surgeon: Gaynelle Arabian, MD;  Location: [facility];  Service: Orthopedics;  Laterality: Right;    MEDICATIONS: . Calcium Carb-Cholecalciferol (CALCIUM 600 + D PO)  . calcium carbonate (TUMS EX) 750 MG chewable tablet  . fexofenadine (ALLEGRA) 180 MG tablet  . naproxen sodium (ALEVE) 220 MG tablet  . Omega-3 Fatty Acids (FISH OIL ULTRA) 1400 MG CAPS  . tamsulosin (FLOMAX) 0.4 MG CAPS capsule  . vitamin B-12 (CYANOCOBALAMIN) 1000 MCG tablet  . Vitamin E 400 units TABS  . Zinc 25 MG TABS   No current facility-administered medications for this encounter.      Maia Plan [facility] Pre-Surgical Testing [phone] 01/04/19  2:17 PM

## 2019-01-04 NOTE — Progress Notes (Signed)
Left a voicemail to instruct Alexander Moss to arrive at 8:00am day of surgery.

## 2019-01-04 NOTE — Progress Notes (Signed)
Alexander Moss returned mty call and is aware to arrive at 8:00AM 01/07/2019.

## 2019-01-06 MED ORDER — BUPIVACAINE LIPOSOME 1.3 % IJ SUSP
20.0000 mL | INTRAMUSCULAR | Status: DC
  Filled 2019-01-06: qty 20

## 2019-01-07 ENCOUNTER — Encounter (HOSPITAL_COMMUNITY)
Admission: RE | Disposition: A | Discharge status: Home / Self Care | Payer: Self-pay | Source: Home / Self Care | Attending: Orthopedic Surgery

## 2019-01-07 ENCOUNTER — Encounter (HOSPITAL_COMMUNITY): Payer: Self-pay | Admitting: Emergency Medicine

## 2019-01-07 ENCOUNTER — Inpatient Hospital Stay (HOSPITAL_COMMUNITY)
Admission: RE | Admit: 2019-01-07 | Discharge: 2019-01-08 | DRG: 470 | Disposition: A | Discharge status: Home / Self Care | Payer: PPO | Attending: Orthopedic Surgery | Admitting: Orthopedic Surgery

## 2019-01-07 ENCOUNTER — Other Ambulatory Visit: Payer: Self-pay

## 2019-01-07 ENCOUNTER — Inpatient Hospital Stay (HOSPITAL_COMMUNITY): Payer: PPO | Admitting: Physician Assistant

## 2019-01-07 ENCOUNTER — Inpatient Hospital Stay (HOSPITAL_COMMUNITY): Payer: PPO | Admitting: Anesthesiology

## 2019-01-07 DIAGNOSIS — G8929 Other chronic pain: Secondary | ICD-10-CM | POA: Diagnosis present

## 2019-01-07 DIAGNOSIS — Z87891 Personal history of nicotine dependence: Secondary | ICD-10-CM | POA: Diagnosis not present

## 2019-01-07 DIAGNOSIS — M545 Low back pain: Secondary | ICD-10-CM | POA: Diagnosis not present

## 2019-01-07 DIAGNOSIS — E785 Hyperlipidemia, unspecified: Secondary | ICD-10-CM | POA: Diagnosis present

## 2019-01-07 DIAGNOSIS — K219 Gastro-esophageal reflux disease without esophagitis: Secondary | ICD-10-CM | POA: Diagnosis present

## 2019-01-07 DIAGNOSIS — Z6835 Body mass index (BMI) 35.0-35.9, adult: Secondary | ICD-10-CM

## 2019-01-07 DIAGNOSIS — E669 Obesity, unspecified: Secondary | ICD-10-CM | POA: Diagnosis not present

## 2019-01-07 DIAGNOSIS — Z79899 Other long term (current) drug therapy: Secondary | ICD-10-CM

## 2019-01-07 DIAGNOSIS — Z801 Family history of malignant neoplasm of trachea, bronchus and lung: Secondary | ICD-10-CM

## 2019-01-07 DIAGNOSIS — Z96651 Presence of right artificial knee joint: Secondary | ICD-10-CM | POA: Diagnosis present

## 2019-01-07 DIAGNOSIS — Z800 Family history of malignant neoplasm of digestive organs: Secondary | ICD-10-CM | POA: Diagnosis not present

## 2019-01-07 DIAGNOSIS — M1712 Unilateral primary osteoarthritis, left knee: Secondary | ICD-10-CM | POA: Diagnosis not present

## 2019-01-07 DIAGNOSIS — H9190 Unspecified hearing loss, unspecified ear: Secondary | ICD-10-CM | POA: Diagnosis not present

## 2019-01-07 DIAGNOSIS — Z791 Long term (current) use of non-steroidal anti-inflammatories (NSAID): Secondary | ICD-10-CM

## 2019-01-07 DIAGNOSIS — G8918 Other acute postprocedural pain: Secondary | ICD-10-CM | POA: Diagnosis not present

## 2019-01-07 DIAGNOSIS — Z833 Family history of diabetes mellitus: Secondary | ICD-10-CM | POA: Diagnosis not present

## 2019-01-07 DIAGNOSIS — M1710 Unilateral primary osteoarthritis, unspecified knee: Secondary | ICD-10-CM | POA: Diagnosis present

## 2019-01-07 DIAGNOSIS — K5730 Diverticulosis of large intestine without perforation or abscess without bleeding: Secondary | ICD-10-CM | POA: Diagnosis not present

## 2019-01-07 HISTORY — PX: TOTAL KNEE ARTHROPLASTY: SHX125

## 2019-01-07 LAB — TYPE AND SCREEN
ABO/RH(D): O POS
Antibody Screen: NEGATIVE

## 2019-01-07 SURGERY — ARTHROPLASTY, KNEE, TOTAL
Anesthesia: Spinal | Laterality: Left

## 2019-01-07 MED ORDER — PROPOFOL 10 MG/ML IV BOLUS
INTRAVENOUS | Status: DC | PRN
  Administered 2019-01-07: 20 mg via INTRAVENOUS
  Administered 2019-01-07: 30 mg via INTRAVENOUS

## 2019-01-07 MED ORDER — METOCLOPRAMIDE HCL 5 MG PO TABS: 5.0000 mg | ORAL_TABLET | Freq: Three times a day (TID) | ORAL | Status: DC | PRN

## 2019-01-07 MED ORDER — HYDROCODONE-ACETAMINOPHEN 7.5-325 MG PO TABS: 1.0000 | ORAL_TABLET | Freq: Once | ORAL | Status: DC | PRN

## 2019-01-07 MED ORDER — ONDANSETRON HCL 4 MG/2ML IJ SOLN: 4.0000 mg | Freq: Four times a day (QID) | INTRAMUSCULAR | Status: DC | PRN

## 2019-01-07 MED ORDER — TAMSULOSIN HCL 0.4 MG PO CAPS
0.4000 mg | ORAL_CAPSULE | Freq: Every day | ORAL | Status: DC
  Administered 2019-01-08: 12:00:00 0.4 mg via ORAL
  Filled 2019-01-07: qty 1

## 2019-01-07 MED ORDER — MENTHOL 3 MG MT LOZG: 1.0000 | LOZENGE | OROMUCOSAL | Status: DC | PRN

## 2019-01-07 MED ORDER — POLYETHYLENE GLYCOL 3350 17 G PO PACK: 17.0000 g | PACK | Freq: Every day | ORAL | Status: DC | PRN

## 2019-01-07 MED ORDER — PHENOL 1.4 % MT LIQD
1.0000 | OROMUCOSAL | Status: DC | PRN
  Filled 2019-01-07: qty 177

## 2019-01-07 MED ORDER — MEPERIDINE HCL 50 MG/ML IJ SOLN: 6.2500 mg | INTRAMUSCULAR | Status: DC | PRN

## 2019-01-07 MED ORDER — DEXAMETHASONE SODIUM PHOSPHATE 10 MG/ML IJ SOLN
8.0000 mg | Freq: Once | INTRAMUSCULAR | Status: AC
  Administered 2019-01-07: 11:00:00 8 mg via INTRAVENOUS

## 2019-01-07 MED ORDER — LORATADINE 10 MG PO TABS
10.0000 mg | ORAL_TABLET | Freq: Every day | ORAL | Status: DC
  Administered 2019-01-08: 08:00:00 10 mg via ORAL
  Filled 2019-01-07: qty 1

## 2019-01-07 MED ORDER — METOCLOPRAMIDE HCL 5 MG/ML IJ SOLN: 5.0000 mg | Freq: Three times a day (TID) | INTRAMUSCULAR | Status: DC | PRN

## 2019-01-07 MED ORDER — ASPIRIN EC 325 MG PO TBEC
325.0000 mg | DELAYED_RELEASE_TABLET | Freq: Two times a day (BID) | ORAL | Status: DC
  Administered 2019-01-08: 325 mg via ORAL
  Filled 2019-01-07: qty 1

## 2019-01-07 MED ORDER — ONDANSETRON HCL 4 MG/2ML IJ SOLN
INTRAMUSCULAR | Status: DC | PRN
  Administered 2019-01-07: 4 mg via INTRAVENOUS

## 2019-01-07 MED ORDER — PROPOFOL 500 MG/50ML IV EMUL
INTRAVENOUS | Status: DC | PRN
  Administered 2019-01-07: 100 ug/kg/min via INTRAVENOUS

## 2019-01-07 MED ORDER — POVIDONE-IODINE 10 % EX SWAB
2.0000 "application " | Freq: Once | CUTANEOUS | Status: AC
  Administered 2019-01-07: 2 via TOPICAL

## 2019-01-07 MED ORDER — SODIUM CHLORIDE 0.9 % IV SOLN
INTRAVENOUS | Status: DC
  Administered 2019-01-07: 23:00:00 via INTRAVENOUS
  Administered 2019-01-07: 1000 mL via INTRAVENOUS

## 2019-01-07 MED ORDER — GABAPENTIN 100 MG PO CAPS
200.0000 mg | ORAL_CAPSULE | Freq: Three times a day (TID) | ORAL | Status: DC
  Administered 2019-01-07 – 2019-01-08 (×3): 200 mg via ORAL
  Filled 2019-01-07 (×4): qty 2

## 2019-01-07 MED ORDER — PHENYLEPHRINE 40 MCG/ML (10ML) SYRINGE FOR IV PUSH (FOR BLOOD PRESSURE SUPPORT)
PREFILLED_SYRINGE | INTRAVENOUS | Status: AC
  Filled 2019-01-07: qty 10

## 2019-01-07 MED ORDER — PHENYLEPHRINE 40 MCG/ML (10ML) SYRINGE FOR IV PUSH (FOR BLOOD PRESSURE SUPPORT)
PREFILLED_SYRINGE | INTRAVENOUS | Status: DC | PRN
  Administered 2019-01-07: 100 ug via INTRAVENOUS
  Administered 2019-01-07: 80 ug via INTRAVENOUS
  Administered 2019-01-07 (×2): 100 ug via INTRAVENOUS

## 2019-01-07 MED ORDER — DEXAMETHASONE SODIUM PHOSPHATE 10 MG/ML IJ SOLN
10.0000 mg | Freq: Once | INTRAMUSCULAR | Status: AC
  Administered 2019-01-08: 10 mg via INTRAVENOUS
  Filled 2019-01-07: qty 1

## 2019-01-07 MED ORDER — LACTATED RINGERS IV SOLN
INTRAVENOUS | Status: DC
  Administered 2019-01-07 (×2): via INTRAVENOUS

## 2019-01-07 MED ORDER — PROPOFOL 10 MG/ML IV BOLUS
INTRAVENOUS | Status: AC
  Filled 2019-01-07: qty 20

## 2019-01-07 MED ORDER — BUPIVACAINE IN DEXTROSE 0.75-8.25 % IT SOLN
INTRATHECAL | Status: DC | PRN
  Administered 2019-01-07: 1.8 mL via INTRATHECAL

## 2019-01-07 MED ORDER — TRANEXAMIC ACID-NACL 1000-0.7 MG/100ML-% IV SOLN
1000.0000 mg | INTRAVENOUS | Status: AC
  Administered 2019-01-07: 11:00:00 1000 mg via INTRAVENOUS
  Filled 2019-01-07: qty 100

## 2019-01-07 MED ORDER — FLEET ENEMA 7-19 GM/118ML RE ENEM: 1.0000 | ENEMA | Freq: Once | RECTAL | Status: DC | PRN

## 2019-01-07 MED ORDER — BUPIVACAINE LIPOSOME 1.3 % IJ SUSP
INTRAMUSCULAR | Status: DC | PRN
  Administered 2019-01-07: 80 mL

## 2019-01-07 MED ORDER — METHOCARBAMOL 500 MG PO TABS
500.0000 mg | ORAL_TABLET | Freq: Four times a day (QID) | ORAL | Status: DC | PRN
  Administered 2019-01-07 – 2019-01-08 (×3): 500 mg via ORAL
  Filled 2019-01-07 (×4): qty 1

## 2019-01-07 MED ORDER — SODIUM CHLORIDE 0.9 % IR SOLN
Status: DC | PRN
  Administered 2019-01-07: 1000 mL

## 2019-01-07 MED ORDER — OXYCODONE HCL 5 MG PO TABS
5.0000 mg | ORAL_TABLET | ORAL | Status: DC | PRN
  Administered 2019-01-07 – 2019-01-08 (×5): 5 mg via ORAL
  Filled 2019-01-07: qty 2
  Filled 2019-01-07 (×4): qty 1

## 2019-01-07 MED ORDER — FENTANYL CITRATE (PF) 100 MCG/2ML IJ SOLN
INTRAMUSCULAR | Status: AC
  Administered 2019-01-07: 100 ug via INTRAVENOUS
  Filled 2019-01-07: qty 2

## 2019-01-07 MED ORDER — ACETAMINOPHEN 10 MG/ML IV SOLN: 1000.0000 mg | Freq: Once | INTRAVENOUS | Status: DC | PRN

## 2019-01-07 MED ORDER — DOCUSATE SODIUM 100 MG PO CAPS
100.0000 mg | ORAL_CAPSULE | Freq: Two times a day (BID) | ORAL | Status: DC
  Administered 2019-01-07 – 2019-01-08 (×2): 100 mg via ORAL
  Filled 2019-01-07 (×2): qty 1

## 2019-01-07 MED ORDER — ACETAMINOPHEN 500 MG PO TABS
1000.0000 mg | ORAL_TABLET | Freq: Four times a day (QID) | ORAL | Status: AC
  Administered 2019-01-07 – 2019-01-08 (×4): 1000 mg via ORAL
  Filled 2019-01-07 (×4): qty 2

## 2019-01-07 MED ORDER — TRAMADOL HCL 50 MG PO TABS
50.0000 mg | ORAL_TABLET | Freq: Four times a day (QID) | ORAL | Status: DC | PRN
  Administered 2019-01-07 (×2): 50 mg via ORAL
  Filled 2019-01-07: qty 1
  Filled 2019-01-07: qty 2
  Filled 2019-01-07: qty 1

## 2019-01-07 MED ORDER — ONDANSETRON HCL 4 MG PO TABS: 4.0000 mg | ORAL_TABLET | Freq: Four times a day (QID) | ORAL | Status: DC | PRN

## 2019-01-07 MED ORDER — ACETAMINOPHEN 10 MG/ML IV SOLN
1000.0000 mg | Freq: Four times a day (QID) | INTRAVENOUS | Status: DC
  Administered 2019-01-07: 1000 mg via INTRAVENOUS
  Filled 2019-01-07: qty 100

## 2019-01-07 MED ORDER — METHOCARBAMOL 500 MG IVPB - SIMPLE MED
500.0000 mg | Freq: Four times a day (QID) | INTRAVENOUS | Status: DC | PRN
  Filled 2019-01-07: qty 50

## 2019-01-07 MED ORDER — HYDROMORPHONE HCL 1 MG/ML IJ SOLN: 0.2500 mg | INTRAMUSCULAR | Status: DC | PRN

## 2019-01-07 MED ORDER — CEFAZOLIN SODIUM-DEXTROSE 2-4 GM/100ML-% IV SOLN
2.0000 g | INTRAVENOUS | Status: AC
  Administered 2019-01-07: 2 g via INTRAVENOUS
  Filled 2019-01-07: qty 100

## 2019-01-07 MED ORDER — BISACODYL 10 MG RE SUPP: 10.0000 mg | Freq: Every day | RECTAL | Status: DC | PRN

## 2019-01-07 MED ORDER — SODIUM CHLORIDE (PF) 0.9 % IJ SOLN
INTRAMUSCULAR | Status: AC
  Filled 2019-01-07: qty 50

## 2019-01-07 MED ORDER — CEFAZOLIN SODIUM-DEXTROSE 2-4 GM/100ML-% IV SOLN
2.0000 g | Freq: Four times a day (QID) | INTRAVENOUS | Status: AC
  Administered 2019-01-07 (×2): 2 g via INTRAVENOUS
  Filled 2019-01-07 (×2): qty 100

## 2019-01-07 MED ORDER — DIPHENHYDRAMINE HCL 12.5 MG/5ML PO ELIX: 12.5000 mg | ORAL_SOLUTION | ORAL | Status: DC | PRN

## 2019-01-07 MED ORDER — PROPOFOL 10 MG/ML IV BOLUS
INTRAVENOUS | Status: AC
  Filled 2019-01-07: qty 60

## 2019-01-07 MED ORDER — CHLORHEXIDINE GLUCONATE 4 % EX LIQD: 60.0000 mL | Freq: Once | CUTANEOUS | Status: DC

## 2019-01-07 MED ORDER — ONDANSETRON HCL 4 MG/2ML IJ SOLN: 4.0000 mg | Freq: Once | INTRAMUSCULAR | Status: DC | PRN

## 2019-01-07 MED ORDER — SODIUM CHLORIDE (PF) 0.9 % IJ SOLN
INTRAMUSCULAR | Status: DC | PRN
  Administered 2019-01-07: 60 mL

## 2019-01-07 MED ORDER — SODIUM CHLORIDE (PF) 0.9 % IJ SOLN
INTRAMUSCULAR | Status: AC
  Filled 2019-01-07: qty 10

## 2019-01-07 MED ORDER — FENTANYL CITRATE (PF) 100 MCG/2ML IJ SOLN
50.0000 ug | INTRAMUSCULAR | Status: DC
  Administered 2019-01-07: 10:00:00 100 ug via INTRAVENOUS

## 2019-01-07 MED ORDER — ROPIVACAINE HCL 5 MG/ML IJ SOLN
INTRAMUSCULAR | Status: DC | PRN
  Administered 2019-01-07: 30 mL via PERINEURAL

## 2019-01-07 MED ORDER — MORPHINE SULFATE (PF) 2 MG/ML IV SOLN: 1.0000 mg | INTRAVENOUS | Status: DC | PRN

## 2019-01-07 SURGICAL SUPPLY — 57 items
ATTUNE MED DOME PAT 41 KNEE (Knees) ×2 IMPLANT
ATTUNE PS FEM LT SZ 7 CEM KNEE (Femur) ×2 IMPLANT
ATTUNE PSRP INSR SZ7 10 KNEE (Insert) ×2 IMPLANT
BAG ZIPLOCK 12X15 (MISCELLANEOUS) ×2 IMPLANT
BANDAGE ACE 6X5 VEL STRL LF (GAUZE/BANDAGES/DRESSINGS) ×2 IMPLANT
BASE TIBIAL ROT PLAT SZ 7 KNEE (Knees) ×1 IMPLANT
BLADE SAG 18X100X1.27 (BLADE) ×2 IMPLANT
BLADE SAW SGTL 11.0X1.19X90.0M (BLADE) ×2 IMPLANT
BLADE SURG SZ10 CARB STEEL (BLADE) ×4 IMPLANT
BOWL SMART MIX CTS (DISPOSABLE) ×2 IMPLANT
CEMENT HV SMART SET (Cement) ×2 IMPLANT
COVER SURGICAL LIGHT HANDLE (MISCELLANEOUS) ×2 IMPLANT
COVER WAND RF STERILE (DRAPES) IMPLANT
CUFF TOURN SGL QUICK 34 (TOURNIQUET CUFF) ×1
CUFF TRNQT CYL 34X4.125X (TOURNIQUET CUFF) ×1 IMPLANT
DECANTER SPIKE VIAL GLASS SM (MISCELLANEOUS) ×2 IMPLANT
DRAPE U-SHAPE 47X51 STRL (DRAPES) ×2 IMPLANT
DRSG ADAPTIC 3X8 NADH LF (GAUZE/BANDAGES/DRESSINGS) ×2 IMPLANT
DRSG PAD ABDOMINAL 8X10 ST (GAUZE/BANDAGES/DRESSINGS) ×2 IMPLANT
DURAPREP 26ML APPLICATOR (WOUND CARE) ×2 IMPLANT
ELECT REM PT RETURN 15FT ADLT (MISCELLANEOUS) ×2 IMPLANT
EVACUATOR 1/8 PVC DRAIN (DRAIN) ×2 IMPLANT
GAUZE SPONGE 4X4 12PLY STRL (GAUZE/BANDAGES/DRESSINGS) ×2 IMPLANT
GLOVE BIO SURGEON STRL SZ7 (GLOVE) ×2 IMPLANT
GLOVE BIO SURGEON STRL SZ8 (GLOVE) ×2 IMPLANT
GLOVE BIOGEL PI IND STRL 6.5 (GLOVE) ×1 IMPLANT
GLOVE BIOGEL PI IND STRL 7.0 (GLOVE) ×1 IMPLANT
GLOVE BIOGEL PI IND STRL 8 (GLOVE) ×1 IMPLANT
GLOVE BIOGEL PI INDICATOR 6.5 (GLOVE) ×1
GLOVE BIOGEL PI INDICATOR 7.0 (GLOVE) ×1
GLOVE BIOGEL PI INDICATOR 8 (GLOVE) ×1
GLOVE SURG SS PI 6.5 STRL IVOR (GLOVE) ×2 IMPLANT
GOWN STRL REUS W/TWL LRG LVL3 (GOWN DISPOSABLE) ×6 IMPLANT
HANDPIECE INTERPULSE COAX TIP (DISPOSABLE) ×1
HOLDER FOLEY CATH W/STRAP (MISCELLANEOUS) IMPLANT
IMMOBILIZER KNEE 20 (SOFTGOODS) ×2
IMMOBILIZER KNEE 20 THIGH 36 (SOFTGOODS) ×1 IMPLANT
KIT TURNOVER KIT A (KITS) IMPLANT
MANIFOLD NEPTUNE II (INSTRUMENTS) ×2 IMPLANT
NS IRRIG 1000ML POUR BTL (IV SOLUTION) ×2 IMPLANT
PACK TOTAL KNEE CUSTOM (KITS) ×2 IMPLANT
PADDING CAST COTTON 6X4 STRL (CAST SUPPLIES) ×6 IMPLANT
PIN STEINMAN FIXATION KNEE (PIN) ×2 IMPLANT
PIN THREADED HEADED SIGMA (PIN) ×2 IMPLANT
PROTECTOR NERVE ULNAR (MISCELLANEOUS) ×2 IMPLANT
SET HNDPC FAN SPRY TIP SCT (DISPOSABLE) ×1 IMPLANT
STRIP CLOSURE SKIN 1/2X4 (GAUZE/BANDAGES/DRESSINGS) ×4 IMPLANT
SUT MNCRL AB 4-0 PS2 18 (SUTURE) ×2 IMPLANT
SUT STRATAFIX 0 PDS 27 VIOLET (SUTURE) ×2
SUT VIC AB 2-0 CT1 27 (SUTURE) ×3
SUT VIC AB 2-0 CT1 TAPERPNT 27 (SUTURE) ×3 IMPLANT
SUTURE STRATFX 0 PDS 27 VIOLET (SUTURE) ×1 IMPLANT
TIBIAL BASE ROT PLAT SZ 7 KNEE (Knees) ×2 IMPLANT
TRAY FOLEY MTR SLVR 16FR STAT (SET/KITS/TRAYS/PACK) ×2 IMPLANT
WATER STERILE IRR 1000ML POUR (IV SOLUTION) ×4 IMPLANT
WRAP KNEE MAXI GEL POST OP (GAUZE/BANDAGES/DRESSINGS) ×2 IMPLANT
YANKAUER SUCT BULB TIP 10FT TU (MISCELLANEOUS) ×2 IMPLANT

## 2019-01-07 NOTE — Transfer of Care (Signed)
Immediate Anesthesia Transfer of Care Note  Patient: Alexander Moss  Procedure(s) Performed: TOTAL KNEE ARTHROPLASTY (Left )  Patient Location: PACU  Anesthesia Type:MAC and Spinal  Level of Consciousness: awake, alert , oriented and patient cooperative  Airway & Oxygen Therapy: Patient Spontanous Breathing and Patient connected to face mask oxygen  Post-op Assessment: Report given to RN, Post -op Vital signs reviewed and stable and Patient moving all extremities  Post vital signs: Reviewed and stable  Last Vitals:  Vitals Value Taken Time  BP    Temp    Pulse    Resp    SpO2      Last Pain:  Vitals:   01/07/19 0830  TempSrc: Oral  PainSc:       Patients Stated Pain Goal: 4 ([id number])  Complications: No apparent anesthesia complications

## 2019-01-07 NOTE — Op Note (Signed)
OPERATIVE REPORT-TOTAL KNEE ARTHROPLASTY   Pre-operative diagnosis- Osteoarthritis  Left knee(s)  Post-operative diagnosis- Osteoarthritis Left knee(s)  Procedure-  Left  Total Knee Arthroplasty  Surgeon- Dione Plover. Shon Indelicato, MD  Assistant- Theresa Duty, PA-C   Anesthesia-  Adductor canal block and spinal  EBL- 25 ml   Drains Hemovac  Tourniquet time-  Total Tourniquet Time Documented: Thigh (Left) - 40 minutes Total: Thigh (Left) - 40 minutes     Complications- None  Condition-PACU - hemodynamically stable.   Brief Clinical Note   Alexander Moss is a 73 y.o. year old male with end stage OA of his left knee with progressively worsening pain and dysfunction. He has constant pain, with activity and at rest and significant functional deficits with difficulties even with ADLs. He has had extensive non-op management including analgesics, injections of cortisone and viscosupplements, and home exercise program, but remains in significant pain with significant dysfunction. Radiographs show bone on bone arthritis medial and patellofemoral. He presents now for left Total Knee Arthroplasty.     Procedure in detail---   The patient is brought into the operating room and positioned supine on the operating table. After successful administration of  Adductor canal block and spinal,   a tourniquet is placed high on the  Left thigh(s) and the lower extremity is prepped and draped in the usual sterile fashion. Time out is performed by the operating team and then the  Left lower extremity is wrapped in Esmarch, knee flexed and the tourniquet inflated to 300 mmHg.       A midline incision is made with a ten blade through the subcutaneous tissue to the level of the extensor mechanism. A fresh blade is used to make a medial parapatellar arthrotomy. Soft tissue over the proximal medial tibia is subperiosteally elevated to the joint line with a knife and into the semimembranosus bursa with a Cobb  elevator. Soft tissue over the proximal lateral tibia is elevated with attention being paid to avoiding the patellar tendon on the tibial tubercle. The patella is everted, knee flexed 90 degrees and the ACL and PCL are removed. Findings are bone on bone medial and patellofemoral with large global osteophytes        The drill is used to create a starting hole in the distal femur and the canal is thoroughly irrigated with sterile saline to remove the fatty contents. The 5 degree Left  valgus alignment guide is placed into the femoral canal and the distal femoral cutting block is pinned to remove 10 mm off the distal femur. Resection is made with an oscillating saw.      The tibia is subluxed forward and the menisci are removed. The extramedullary alignment guide is placed referencing proximally at the medial aspect of the tibial tubercle and distally along the second metatarsal axis and tibial crest. The block is pinned to remove 29mm off the more deficient medial  side. Resection is made with an oscillating saw. Size 7is the most appropriate size for the tibia and the proximal tibia is prepared with the modular drill and keel punch for that size.      The femoral sizing guide is placed and size 7 is most appropriate. Rotation is marked off the epicondylar axis and confirmed by creating a rectangular flexion gap at 90 degrees. The size 7 cutting block is pinned in this rotation and the anterior, posterior and chamfer cuts are made with the oscillating saw. The intercondylar block is then placed and that cut  is made.      Trial size 7 tibial component, trial size 7 posterior stabilized femur and a 10  mm posterior stabilized rotating platform insert trial is placed. Full extension is achieved with excellent varus/valgus and anterior/posterior balance throughout full range of motion. The patella is everted and thickness measured to be 27  mm. Free hand resection is taken to 15 mm, a 41 template is placed, lug holes  are drilled, trial patella is placed, and it tracks normally. Osteophytes are removed off the posterior femur with the trial in place. All trials are removed and the cut bone surfaces prepared with pulsatile lavage. Cement is mixed and once ready for implantation, the size 7 tibial implant, size  7 posterior stabilized femoral component, and the size 41 patella are cemented in place and the patella is held with the clamp. The trial insert is placed and the knee held in full extension. The Exparel (20 ml mixed with 60 ml saline) is injected into the extensor mechanism, posterior capsule, medial and lateral gutters and subcutaneous tissues.  All extruded cement is removed and once the cement is hard the permanent 10 mm posterior stabilized rotating platform insert is placed into the tibial tray.      The wound is copiously irrigated with saline solution and the extensor mechanism closed over a hemovac drain with #1 V-loc suture. The tourniquet is released for a total tourniquet time of 40  minutes. Flexion against gravity is 140 degrees and the patella tracks normally. Subcutaneous tissue is closed with 2.0 vicryl and subcuticular with running 4.0 Monocryl. The incision is cleaned and dried and steri-strips and a bulky sterile dressing are applied. The limb is placed into a knee immobilizer and the patient is awakened and transported to recovery in stable condition.      Please note that a surgical assistant was a medical necessity for this procedure in order to perform it in a safe and expeditious manner. Surgical assistant was necessary to retract the ligaments and vital neurovascular structures to prevent injury to them and also necessary for proper positioning of the limb to allow for anatomic placement of the prosthesis.   Dione Plover Shealynn Saulnier, MD    01/07/2019, 11:36 AM

## 2019-01-07 NOTE — Anesthesia Procedure Notes (Addendum)
Spinal  Patient location during procedure: OR Start time: 01/07/2019 10:27 AM End time: 01/07/2019 10:31 AM Reason for block: at surgeon's request Staffing Resident/CRNA: Anne Fu, CRNA Performed: resident/CRNA  Preanesthetic Checklist Completed: patient identified, site marked, surgical consent, pre-op evaluation, timeout performed, IV checked, risks and benefits discussed and monitors and equipment checked Spinal Block Patient position: sitting Prep: DuraPrep Patient monitoring: heart rate, continuous pulse ox and blood pressure Approach: midline Location: L2-3 Injection technique: single-shot Needle Needle type: Pencan  Needle gauge: 24 G Needle length: 9 cm Assessment Sensory level: T6 Additional Notes  Functioning IV was confirmed and monitors were applied. Expiration date of kit checked and confirmed. Sterile prep and drape, including hand hygiene and sterile gloves were used. The patient was positioned and the spine was prepped. The skin was anesthetized with lidocaine.  Free flow of clear CSF was obtained prior to injecting local anesthetic into the CSF X 1 attempt.  The spinal needle aspirated freely following injection.  The needle was carefully withdrawn. Patient tolerated procedure well, without complications. Loss of motor and sensory on exam post injection.

## 2019-01-07 NOTE — Anesthesia Postprocedure Evaluation (Signed)
Anesthesia Post Note  Patient: Alexander Moss  Procedure(s) Performed: TOTAL KNEE ARTHROPLASTY (Left )     Patient location during evaluation: Nursing Unit Anesthesia Type: Spinal Level of consciousness: oriented and awake and alert Pain management: pain level controlled Vital Signs Assessment: post-procedure vital signs reviewed and stable Respiratory status: spontaneous breathing and respiratory function stable Cardiovascular status: blood pressure returned to baseline and stable Postop Assessment: no headache, no backache, no apparent nausea or vomiting and patient able to bend at knees Anesthetic complications: no    Last Vitals:  Vitals:   01/07/19 1346 01/07/19 1448  BP: 108/68 116/74  Pulse: (!) 50 (!) 55  Resp: 14 14  Temp: (!) 36.4 C 36.6 C  SpO2: 98% 99%    Last Pain:  Vitals:   01/07/19 1458  TempSrc:   PainSc: 4                  Barnet Glasgow

## 2019-01-07 NOTE — Evaluation (Signed)
Physical Therapy Evaluation Patient Details Name: Alexander Moss MRN: [id number] DOB: Nov 30, 1945 Today's Date: 01/07/2019   History of Present Illness  73 yo male s/p L TKR on 01/07/19. PMH includes hearing loss, chronic pain, GERD, obesity, diverticulosis, OA, HLD, TKR 2017.  Clinical Impression  Pt presents with L knee ROM, mild L knee pain, Increased time and effort to perform mobility, and decreased activity tolerance due to L knee pain. Pt to benefit from acute PT to address deficits. Pt ambulated hallway distance with RW with min guard assist, verbal cuing for form and safety provided. Pt educated on ankle pumps (20/hour) to perform this afternoon/evening to increase circulation, to pt's tolerance and limited by pain. PT to progress mobility as tolerated, and will continue to follow acutely.        Follow Up Recommendations Follow surgeon's recommendation for DC plan and follow-up therapies;Supervision for mobility/OOB(OPPT)    Equipment Recommendations  None recommended by PT    Recommendations for Other Services       Precautions / Restrictions Precautions Precautions: Fall Required Braces or Orthoses: Knee Immobilizer - Left Knee Immobilizer - Left: On when out of bed or walking;Discontinue once straight leg raise with < 10 degree lag Restrictions Weight Bearing Restrictions: No Other Position/Activity Restrictions: WBAT      Mobility  Bed Mobility Overal bed mobility: Needs Assistance Bed Mobility: Supine to Sit     Supine to sit: Min guard;HOB elevated     General bed mobility comments: Min gaurd for safety, verbal cuing for sequencing.  Transfers Overall transfer level: Needs assistance Equipment used: Rolling walker (2 wheeled) Transfers: Sit to/from Stand Sit to Stand: Min guard;From elevated surface         General transfer comment: Min guard for safety, verbal cuing for hand placement when rising.  Ambulation/Gait Ambulation/Gait assistance: Min  guard;+2 safety/equipment Gait Distance (Feet): 65 Feet Assistive device: Rolling walker (2 wheeled) Gait Pattern/deviations: Step-to pattern;Trunk flexed Gait velocity: normal   General Gait Details: Min guard for safety, verbal cuing for slowing down as pt attempting to walk quickly, placement in RW, sequencing.  Stairs            Wheelchair Mobility    Modified Rankin (Stroke Patients Only)       Balance Overall balance assessment: Mild deficits observed, not formally tested                                           Pertinent Vitals/Pain Pain Assessment: 0-10 Pain Score: 2  Pain Location: L knee Pain Descriptors / Indicators: Sore Pain Intervention(s): Monitored during session;Limited activity within patient's tolerance;Premedicated before session;Repositioned;Ice applied    Home Living   Living Arrangements: Spouse/significant other Available Help at Discharge: Family;Available 24 hours/day Type of Home: House Home Access: Stairs to enter Entrance Stairs-Rails: None Entrance Stairs-Number of Steps: 2 Home Layout: Multi-level Home Equipment: Bedside commode;Walker - 2 wheels;Cane - single point      Prior Function Level of Independence: Independent               Hand Dominance   Dominant Hand: Right    Extremity/Trunk Assessment   Upper Extremity Assessment Upper Extremity Assessment: Overall WFL for tasks assessed    Lower Extremity Assessment Lower Extremity Assessment: Overall WFL for tasks assessed;LLE deficits/detail LLE Deficits / Details: able to perform ankle pumps, quad set, heel slide, SLR with  quad lag ~10* LLE Sensation: WNL    Cervical / Trunk Assessment Cervical / Trunk Assessment: Normal  Communication   Communication: HOH  Cognition Arousal/Alertness: Awake/alert Behavior During Therapy: WFL for tasks assessed/performed Overall Cognitive Status: Within Functional Limits for tasks assessed                                         General Comments      Exercises     Assessment/Plan    PT Assessment Patient needs continued PT services  PT Problem List Decreased strength;Decreased mobility;Decreased range of motion;Decreased activity tolerance;Decreased balance;Decreased knowledge of use of DME;Pain       PT Treatment Interventions DME instruction;Therapeutic activities;Gait training;Therapeutic exercise;Patient/family education;Balance training;Stair training;Functional mobility training    PT Goals (Current goals can be found in the Care Plan section)  Acute Rehab PT Goals Patient Stated Goal: go home PT Goal Formulation: With patient Time For Goal Achievement: 01/14/19 Potential to Achieve Goals: Good    Frequency 7X/week   Barriers to discharge        Co-evaluation               AM-PAC PT "6 Clicks" Mobility  Outcome Measure Help needed turning from your back to your side while in a flat bed without using bedrails?: A Little Help needed moving from lying on your back to sitting on the side of a flat bed without using bedrails?: A Little Help needed moving to and from a bed to a chair (including a wheelchair)?: A Little Help needed standing up from a chair using your arms (e.g., wheelchair or bedside chair)?: A Little Help needed to walk in hospital room?: A Little Help needed climbing 3-5 steps with a railing? : A Lot 6 Click Score: 17    End of Session Equipment Utilized During Treatment: Gait belt;Left knee immobilizer Activity Tolerance: Patient tolerated treatment well Patient left: in chair;with call bell/phone within reach;with chair alarm set;with SCD's reapplied Nurse Communication: Mobility status PT Visit Diagnosis: Difficulty in walking, not elsewhere classified (R26.2);Other abnormalities of gait and mobility (R26.89)    Time: 5859-2924 PT Time Calculation (min) (ACUTE ONLY): 19 min   Charges:   PT Evaluation $PT Eval Low  Complexity: 1 Low          Julien Girt, PT Acute Rehabilitation Services Pager [phone]  Office [phone]   Roxine Caddy D Elonda Husky 01/07/2019, 6:45 PM

## 2019-01-07 NOTE — Anesthesia Procedure Notes (Signed)
Anesthesia Regional Block: Adductor canal block   Pre-Anesthetic Checklist: ,, timeout performed, Correct Patient, Correct Site, Correct Laterality, Correct Procedure, Correct Position, site marked, Risks and benefits discussed,  Surgical consent,  Pre-op evaluation,  At surgeon's request and post-op pain management  Laterality: Lower and Left  Prep: chloraprep       Needles:  Injection technique: Single-shot  Needle Type: Echogenic Needle     Needle Length: 9cm  Needle Gauge: 22     Additional Needles:   Procedures:,,,, ultrasound used (permanent image in chart),,,,  Narrative:  Start time: 01/07/2019 9:47 AM End time: 01/07/2019 9:54 AM Injection made incrementally with aspirations every 5 mL.  Performed by: Personally  Anesthesiologist: Barnet Glasgow, MD  Additional Notes: Block assessed prior to surgery. Pt tolerated procedure well.

## 2019-01-07 NOTE — Progress Notes (Signed)
AssistedDr. Houser with left, ultrasound guided, adductor canal block. Side rails up, monitors on throughout procedure. See vital signs in flow sheet. Tolerated Procedure well.  

## 2019-01-07 NOTE — Interval H&P Note (Signed)
History and Physical Interval Note:  01/07/2019 8:08 AM  Alexander Moss  has presented today for surgery, with the diagnosis of left knee osteoarthritis.  The various methods of treatment have been discussed with the patient and family. After consideration of risks, benefits and other options for treatment, the patient has consented to  Procedure(s) with comments: TOTAL KNEE ARTHROPLASTY (Left) - 45min as a surgical intervention.  The patient's history has been reviewed, patient examined, no change in status, stable for surgery.  I have reviewed the patient's chart and labs.  Questions were answered to the patient's satisfaction.     Pilar Plate Shiela Bruns

## 2019-01-07 NOTE — Discharge Instructions (Signed)
° °Dr. Frank Aluisio °Total Joint Specialist °[facility] °[address] °[place], [place] [address] °[phone] ° °TOTAL KNEE REPLACEMENT POSTOPERATIVE DIRECTIONS ° °Knee Rehabilitation, Guidelines Following Surgery  °Results after knee surgery are often greatly improved when you follow the exercise, range of motion and muscle strengthening exercises prescribed by your doctor. Safety measures are also important to protect the knee from further injury. Any time any of these exercises cause you to have increased pain or swelling in your knee joint, decrease the amount until you are comfortable again and slowly increase them. If you have problems or questions, call your caregiver or physical therapist for advice.  ° °HOME CARE INSTRUCTIONS  °• Remove items at home which could result in a fall. This includes throw rugs or furniture in walking pathways.  °· ICE to the affected knee every three hours for 30 minutes at a time and then as needed for pain and swelling.  Continue to use ice on the knee for pain and swelling from surgery. You may notice swelling that will progress down to the foot and ankle.  This is normal after surgery.  Elevate the leg when you are not up walking on it.   °· Continue to use the breathing machine which will help keep your temperature down.  It is common for your temperature to cycle up and down following surgery, especially at night when you are not up moving around and exerting yourself.  The breathing machine keeps your lungs expanded and your temperature down. °· Do not place pillow under knee, focus on keeping the knee straight while resting ° °DIET °You may resume your previous home diet once your are discharged from the hospital. ° °DRESSING / WOUND CARE / SHOWERING °You may shower 3 days after surgery, but keep the wounds dry during showering.  You may use an occlusive plastic wrap (Press'n Seal for example), NO SOAKING/SUBMERGING IN THE BATHTUB.  If the bandage  gets wet, change with a clean dry gauze.  If the incision gets wet, pat the wound dry with a clean towel. °You may start showering once you are discharged home but do not submerge the incision under water. Just pat the incision dry and apply a dry gauze dressing on daily. °Change the surgical dressing daily and reapply a dry dressing each time. ° °ACTIVITY °Walk with your walker as instructed. °Use walker as long as suggested by your caregivers. °Avoid periods of inactivity such as sitting longer than an hour when not asleep. This helps prevent blood clots.  °You may resume a sexual relationship in one month or when given the OK by your doctor.  °You may return to work once you are cleared by your doctor.  °Do not drive a car for 6 weeks or until released by you surgeon.  °Do not drive while taking narcotics. ° °WEIGHT BEARING °Weight bearing as tolerated with assist device (walker, cane, etc) as directed, use it as long as suggested by your surgeon or therapist, typically at least 4-6 weeks. ° °POSTOPERATIVE CONSTIPATION PROTOCOL °Constipation - defined medically as fewer than three stools per week and severe constipation as less than one stool per week. ° °One of the most common issues patients have following surgery is constipation.  Even if you have a regular bowel pattern at home, your normal regimen is likely to be disrupted due to multiple reasons following surgery.  Combination of anesthesia, postoperative narcotics, change in appetite and fluid intake all can affect your bowels.    In order to avoid complications following surgery, here are some recommendations in order to help you during your recovery period. ° °Colace (docusate) - Pick up an over-the-counter form of Colace or another stool softener and take twice a day as long as you are requiring postoperative pain medications.  Take with a full glass of water daily.  If you experience loose stools or diarrhea, hold the colace until you stool forms back  up.  If your symptoms do not get better within 1 week or if they get worse, check with your doctor. ° °Dulcolax (bisacodyl) - Pick up over-the-counter and take as directed by the product packaging as needed to assist with the movement of your bowels.  Take with a full glass of water.  Use this product as needed if not relieved by Colace only.  ° °MiraLax (polyethylene glycol) - Pick up over-the-counter to have on hand.  MiraLax is a solution that will increase the amount of water in your bowels to assist with bowel movements.  Take as directed and can mix with a glass of water, juice, soda, coffee, or tea.  Take if you go more than two days without a movement. °Do not use MiraLax more than once per day. Call your doctor if you are still constipated or irregular after using this medication for 7 days in a row. ° °If you continue to have problems with postoperative constipation, please contact the office for further assistance and recommendations.  If you experience "the worst abdominal pain ever" or develop nausea or vomiting, please contact the office immediatly for further recommendations for treatment. ° °ITCHING ° If you experience itching with your medications, try taking only a single pain pill, or even half a pain pill at a time.  You can also use Benadryl over the counter for itching or also to help with sleep.  ° °TED HOSE STOCKINGS °Wear the elastic stockings on both legs for three weeks following surgery during the day but you may remove then at night for sleeping. ° °MEDICATIONS °See your medication summary on the “After Visit Summary” that the nursing staff will review with you prior to discharge.  You may have some home medications which will be placed on hold until you complete the course of blood thinner medication.  It is important for you to complete the blood thinner medication as prescribed by your surgeon.  Continue your approved medications as instructed at time of discharge. ° °PRECAUTIONS °If  you experience chest pain or shortness of breath - call 911 immediately for transfer to the hospital emergency department.  °If you develop a fever greater that 101 F, purulent drainage from wound, increased redness or drainage from wound, foul odor from the wound/dressing, or calf pain - CONTACT YOUR SURGEON.   °                                                °FOLLOW-UP APPOINTMENTS °Make sure you keep all of your appointments after your operation with your surgeon and caregivers. You should call the office at the above phone number and make an appointment for approximately two weeks after the date of your surgery or on the date instructed by your surgeon outlined in the "After Visit Summary". ° ° °RANGE OF MOTION AND STRENGTHENING EXERCISES  °Rehabilitation of the knee is important following a knee injury or   an operation. After just a few days of immobilization, the muscles of the thigh which control the knee become weakened and shrink (atrophy). Knee exercises are designed to build up the tone and strength of the thigh muscles and to improve knee motion. Often times heat used for twenty to thirty minutes before working out will loosen up your tissues and help with improving the range of motion but do not use heat for the first two weeks following surgery. These exercises can be done on a training (exercise) mat, on the floor, on a table or on a bed. Use what ever works the best and is most comfortable for you Knee exercises include:  °• Leg Lifts - While your knee is still immobilized in a splint or cast, you can do straight leg raises. Lift the leg to 60 degrees, hold for 3 sec, and slowly lower the leg. Repeat 10-20 times 2-3 times daily. Perform this exercise against resistance later as your knee gets better.  °• Quad and Hamstring Sets - Tighten up the muscle on the front of the thigh (Quad) and hold for 5-10 sec. Repeat this 10-20 times hourly. Hamstring sets are done by pushing the foot backward against an  object and holding for 5-10 sec. Repeat as with quad sets.  °· Leg Slides: Lying on your back, slowly slide your foot toward your buttocks, bending your knee up off the floor (only go as far as is comfortable). Then slowly slide your foot back down until your leg is flat on the floor again. °· Angel Wings: Lying on your back spread your legs to the side as far apart as you can without causing discomfort.  °A rehabilitation program following serious knee injuries can speed recovery and prevent re-injury in the future due to weakened muscles. Contact your doctor or a physical therapist for more information on knee rehabilitation.  ° °IF YOU ARE TRANSFERRED TO A SKILLED REHAB FACILITY °If the patient is transferred to a skilled rehab facility following release from the hospital, a list of the current medications will be sent to the facility for the patient to continue.  When discharged from the skilled rehab facility, please have the facility set up the patient's Home Health Physical Therapy prior to being released. Also, the skilled facility will be responsible for providing the patient with their medications at time of release from the facility to include their pain medication, the muscle relaxants, and their blood thinner medication. If the patient is still at the rehab facility at time of the two week follow up appointment, the skilled rehab facility will also need to assist the patient in arranging follow up appointment in our office and any transportation needs. ° °MAKE SURE YOU:  °• Understand these instructions.  °• Get help right away if you are not doing well or get worse.  ° ° °Pick up stool softner and laxative for home use following surgery while on pain medications. °Do not submerge incision under water. °Please use good hand washing techniques while changing dressing each day. °May shower starting three days after surgery. °Please use a clean towel to pat the incision dry following showers. °Continue to  use ice for pain and swelling after surgery. °Do not use any lotions or creams on the incision until instructed by your surgeon. ° °

## 2019-01-08 ENCOUNTER — Encounter (HOSPITAL_COMMUNITY): Payer: Self-pay | Admitting: Orthopedic Surgery

## 2019-01-08 LAB — CBC
HCT: 38.9 % — ABNORMAL LOW (ref 39.0–52.0)
Hemoglobin: 13.3 g/dL (ref 13.0–17.0)
MCH: 34.4 pg — ABNORMAL HIGH (ref 26.0–34.0)
MCHC: 34.2 g/dL (ref 30.0–36.0)
MCV: 100.5 fL — ABNORMAL HIGH (ref 80.0–100.0)
Platelets: 219 10*3/uL (ref 150–400)
RBC: 3.87 MIL/uL — ABNORMAL LOW (ref 4.22–5.81)
RDW: 12.9 % (ref 11.5–15.5)
WBC: 13 10*3/uL — ABNORMAL HIGH (ref 4.0–10.5)
nRBC: 0 % (ref 0.0–0.2)

## 2019-01-08 LAB — BASIC METABOLIC PANEL
Anion gap: 7 (ref 5–15)
BUN: 17 mg/dL (ref 8–23)
CO2: 23 mmol/L (ref 22–32)
Calcium: 8.3 mg/dL — ABNORMAL LOW (ref 8.9–10.3)
Chloride: 107 mmol/L (ref 98–111)
Creatinine, Ser: 0.92 mg/dL (ref 0.61–1.24)
GFR calc Af Amer: >60 mL/min (ref >60)
GFR calc non Af Amer: >60 mL/min (ref >60)
Glucose, Bld: 138 mg/dL — ABNORMAL HIGH (ref 70–99)
Potassium: 4.3 mmol/L (ref 3.5–5.1)
Sodium: 137 mmol/L (ref 135–145)

## 2019-01-08 MED ORDER — OXYCODONE HCL 5 MG PO TABS: 5.0000 mg | ORAL_TABLET | Freq: Four times a day (QID) | ORAL | 0 refills | Status: DC | PRN

## 2019-01-08 MED ORDER — ASPIRIN 325 MG PO TBEC: 325.0000 mg | DELAYED_RELEASE_TABLET | Freq: Two times a day (BID) | ORAL | 0 refills | Status: AC

## 2019-01-08 MED ORDER — TRAMADOL HCL 50 MG PO TABS: 50.0000 mg | ORAL_TABLET | Freq: Four times a day (QID) | ORAL | 0 refills | Status: DC | PRN

## 2019-01-08 MED ORDER — METHOCARBAMOL 500 MG PO TABS: 500.0000 mg | ORAL_TABLET | Freq: Four times a day (QID) | ORAL | 0 refills | Status: DC | PRN

## 2019-01-08 MED ORDER — GABAPENTIN 100 MG PO CAPS: 200.0000 mg | ORAL_CAPSULE | Freq: Three times a day (TID) | ORAL | 0 refills | Status: DC

## 2019-01-08 NOTE — Plan of Care (Signed)
Patient discharged home. He is waiting on his ride

## 2019-01-08 NOTE — Progress Notes (Signed)
Physical Therapy Treatment Patient Details Name: Alexander Moss MRN: [id number] DOB: 03/03/1946 Today's Date: 01/08/2019    History of Present Illness 73 yo male s/p L TKR on 01/07/19. PMH includes hearing loss, chronic pain, GERD, obesity, diverticulosis, OA, HLD, TKR 2017.    PT Comments    Pt performed LE exercises and then ambulated in hallway. Will return for afternoon session and practice steps.  Pt then hopeful for d/c home later today.   Follow Up Recommendations  Follow surgeon's recommendation for DC plan and follow-up therapies;Supervision for mobility/OOB     Equipment Recommendations  None recommended by PT    Recommendations for Other Services       Precautions / Restrictions Precautions Precautions: Fall;Knee Precaution Comments: able to perform SLR Restrictions Other Position/Activity Restrictions: WBAT    Mobility  Bed Mobility Overal bed mobility: Needs Assistance Bed Mobility: Supine to Sit     Supine to sit: Min guard;HOB elevated     General bed mobility comments: verbal cues for technique  Transfers Overall transfer level: Needs assistance Equipment used: Rolling walker (2 wheeled) Transfers: Sit to/from Stand Sit to Stand: Min guard;From elevated surface         General transfer comment: verbal cues for UE and LE positioning  Ambulation/Gait Ambulation/Gait assistance: Min guard Gait Distance (Feet): 140 Feet Assistive device: Rolling walker (2 wheeled) Gait Pattern/deviations: Step-to pattern;Antalgic;Decreased stance time - left     General Gait Details: verbal cues for sequence, RW positioning, step length, decreasing toe out   Stairs             Wheelchair Mobility    Modified Rankin (Stroke Patients Only)       Balance                                            Cognition Arousal/Alertness: Awake/alert Behavior During Therapy: WFL for tasks assessed/performed Overall Cognitive Status:  Within Functional Limits for tasks assessed                                        Exercises Total Joint Exercises Ankle Circles/Pumps: AROM;Both;10 reps Quad Sets: AROM;Both;10 reps Short Arc Quad: AROM;Left;10 reps Heel Slides: AAROM;Left;10 reps Hip ABduction/ADduction: AROM;Left;10 reps Straight Leg Raises: AROM;Left;10 reps    General Comments        Pertinent Vitals/Pain Pain Assessment: 0-10 Pain Score: 2  Pain Location: L knee Pain Descriptors / Indicators: Sore;Tightness Pain Intervention(s): Limited activity within patient's tolerance;Monitored during session;Repositioned    Home Living                      Prior Function            PT Goals (current goals can now be found in the care plan section) Progress towards PT goals: Progressing toward goals    Frequency    7X/week      PT Plan Current plan remains appropriate    Co-evaluation              AM-PAC PT "6 Clicks" Mobility   Outcome Measure  Help needed turning from your back to your side while in a flat bed without using bedrails?: A Little Help needed moving from lying on your back to sitting on the side  of a flat bed without using bedrails?: A Little Help needed moving to and from a bed to a chair (including a wheelchair)?: A Little Help needed standing up from a chair using your arms (e.g., wheelchair or bedside chair)?: A Little Help needed to walk in hospital room?: A Little Help needed climbing 3-5 steps with a railing? : A Lot 6 Click Score: 17    End of Session Equipment Utilized During Treatment: Gait belt Activity Tolerance: Patient tolerated treatment well Patient left: in chair;with call bell/phone within reach;with chair alarm set   PT Visit Diagnosis: Difficulty in walking, not elsewhere classified (R26.2);Other abnormalities of gait and mobility (R26.89)     Time: 9977-4142 PT Time Calculation (min) (ACUTE ONLY): 26 min  Charges:  $Gait  Training: 8-22 mins $Therapeutic Exercise: 8-22 mins                     Carmelia Bake, PT, DPT Acute Rehabilitation Services Office: [phone] Pager: [phone]  Trena Platt 01/08/2019, 12:33 PM

## 2019-01-08 NOTE — Progress Notes (Signed)
Physical Therapy Treatment Patient Details Name: Alexander Moss MRN: [id number] DOB: 19-Jul-1945 Today's Date: 01/08/2019    History of Present Illness 73 yo male s/p L TKR on 01/07/19. PMH includes hearing loss, chronic pain, GERD, obesity, diverticulosis, OA, HLD, TKR 2017.    PT Comments    Pt ambulated in hallway and practiced safe stair technique.  Pt required min assist for steps initially however performed a second time min/guard assist.  Pt feels able to perform steps at home.  Pt eager for d/c home today.    Follow Up Recommendations  Follow surgeon's recommendation for DC plan and follow-up therapies;Supervision for mobility/OOB     Equipment Recommendations  None recommended by PT    Recommendations for Other Services       Precautions / Restrictions Precautions Precautions: Fall;Knee Precaution Comments: able to perform SLR Restrictions Other Position/Activity Restrictions: WBAT    Mobility  Bed Mobility Overal bed mobility: Needs Assistance Bed Mobility: Supine to Sit     Supine to sit: Min guard;HOB elevated     General bed mobility comments: pt up in recliner on arrival  Transfers Overall transfer level: Needs assistance Equipment used: Rolling walker (2 wheeled) Transfers: Sit to/from Stand Sit to Stand: Min guard;Supervision         General transfer comment: verbal cues for UE and LE positioning  Ambulation/Gait Ambulation/Gait assistance: Min guard Gait Distance (Feet): 200 Feet Assistive device: Rolling walker (2 wheeled) Gait Pattern/deviations: Step-to pattern;Antalgic;Decreased stance time - left     General Gait Details: verbal cues for sequence, RW positioning, step length, decreasing toe out   Stairs Stairs: Yes Stairs assistance: Min guard;Min assist Stair Management: Step to pattern;Forwards;One rail Left Number of Stairs: 2 General stair comments: pt wished to perform steps with one rail; educated on safe technique and  sequence; pt reports understanding   Wheelchair Mobility    Modified Rankin (Stroke Patients Only)       Balance                                            Cognition Arousal/Alertness: Awake/alert Behavior During Therapy: WFL for tasks assessed/performed Overall Cognitive Status: Within Functional Limits for tasks assessed                                        Exercises     General Comments        Pertinent Vitals/Pain Pain Assessment: 0-10 Pain Score: 6  Pain Location: L knee Pain Descriptors / Indicators: Sore;Tightness Pain Intervention(s): Monitored during session;Limited activity within patient's tolerance;Patient requesting pain meds-RN notified    Home Living                      Prior Function            PT Goals (current goals can now be found in the care plan section) Progress towards PT goals: Progressing toward goals    Frequency    7X/week      PT Plan Current plan remains appropriate    Co-evaluation              AM-PAC PT "6 Clicks" Mobility   Outcome Measure  Help needed turning from your back to your side while in a flat  bed without using bedrails?: A Little Help needed moving from lying on your back to sitting on the side of a flat bed without using bedrails?: A Little Help needed moving to and from a bed to a chair (including a wheelchair)?: A Little Help needed standing up from a chair using your arms (e.g., wheelchair or bedside chair)?: A Little Help needed to walk in hospital room?: A Little Help needed climbing 3-5 steps with a railing? : A Little 6 Click Score: 18    End of Session Equipment Utilized During Treatment: Gait belt Activity Tolerance: Patient tolerated treatment well Patient left: Other (comment)(in bathroom per his request, agreed to use pull cord for assist off commode) Nurse Communication: Mobility status PT Visit Diagnosis: Difficulty in walking, not  elsewhere classified (R26.2);Other abnormalities of gait and mobility (R26.89)     Time: 8938-1017 PT Time Calculation (min) (ACUTE ONLY): 15 min  Charges:  $Gait Training: 8-22 mins          Carmelia Bake, PT, DPT Acute Rehabilitation Services Office: [phone] Pager: [phone]  Alexander Moss 01/08/2019, 3:07 PM

## 2019-01-08 NOTE — Progress Notes (Signed)
   Subjective: 1 Day Post-Op Procedure(s) (LRB): TOTAL KNEE ARTHROPLASTY (Left) Patient reports pain as mild.   Patient seen in rounds by Dr. Wynelle Link. Patient is well, and has had no acute complaints or problems other than discomfort in the left knee. No issues overnight. Denies chest pain, SOB, or calf pain. Foley catheter removed this AM.  We will continue therapy today.   Objective: Vital signs in last 24 hours: Temp:  [97.5 F (36.4 C)-98.1 F (36.7 C)] 97.7 F (36.5 C) (06/30 0558) Pulse Rate:  [48-69] 63 (06/30 0558) Resp:  [13-22] 18 (06/30 0558) BP: (99-141)/(44-94) 125/66 (06/30 0558) SpO2:  [92 %-100 %] 94 % (06/30 0558) Weight:  [102.3 kg] 102.3 kg (06/29 0805)  Intake/Output from previous day:  Intake/Output Summary (Last 24 hours) at 01/08/2019 0725 Last data filed at 01/08/2019 0558 Gross per 24 hour  Intake 3772.36 ml  Output 3805 ml  Net -32.64 ml    Labs: Recent Labs    01/08/19 0304  HGB 13.3   Recent Labs    01/08/19 0304  WBC 13.0*  RBC 3.87*  HCT 38.9*  PLT 219   Recent Labs    01/08/19 0304  NA 137  K 4.3  CL 107  CO2 23  BUN 17  CREATININE 0.92  GLUCOSE 138*  CALCIUM 8.3*   Exam: General - Patient is Alert and Oriented Extremity - Neurologically intact Neurovascular intact Sensation intact distally Dorsiflexion/Plantar flexion intact Dressing - dressing C/D/I Motor Function - intact, moving foot and toes well on exam.   Past Medical History:  Diagnosis Date  . Arthritis   . GERD (gastroesophageal reflux disease)   . Hyperlipidemia   . Left inguinal hernia 08/2018    Assessment/Plan: 1 Day Post-Op Procedure(s) (LRB): TOTAL KNEE ARTHROPLASTY (Left) Active Problems:   OA (osteoarthritis) of knee  Estimated body mass index is 35.31 kg/m as calculated from the following:   Height as of this encounter: 5\' 7"  (1.702 m).   Weight as of this encounter: 102.3 kg. Advance diet Up with therapy D/C IV fluids  Anticipated  LOS equal to or greater than 2 midnights due to - Age 70 and older with one or more of the following:  - Obesity  - Expected need for hospital services (PT, OT, Nursing) required for safe  discharge  - Anticipated need for postoperative skilled nursing care or inpatient rehab  - Active co-morbidities: None OR   - Unanticipated findings during/Post Surgery: None  - Patient is a high risk of re-admission due to: None    DVT Prophylaxis - Aspirin Weight bearing as tolerated. D/C O2 and pulse ox and try on room air. Hemovac pulled without difficulty, will continue therapy today.  Plan is to go Home after hospital stay.  Plan for discharge later today once meeting goals with therapy. Scheduled for outpatient PT at [facility] in [place]. Follow-up in the office in 2 weeks.   Theresa Duty, PA-C Orthopedic Surgery 01/08/2019, 7:25 AM

## 2019-01-09 NOTE — Discharge Summary (Signed)
Physician Discharge Summary   Patient ID: Alexander Moss MRN: [id number] DOB/AGE: Dec 07, 1945 73 y.o.  Admit date: 01/07/2019 Discharge date: 01/08/2019  Primary Diagnosis: Osteoarthritis, left knee   Admission Diagnoses:  Past Medical History:  Diagnosis Date  . Arthritis   . GERD (gastroesophageal reflux disease)   . Hyperlipidemia   . Left inguinal hernia 08/2018   Discharge Diagnoses:   Active Problems:   OA (osteoarthritis) of knee  Estimated body mass index is 35.31 kg/m as calculated from the following:   Height as of this encounter: 5\' 7"  (1.702 m).   Weight as of this encounter: 102.3 kg.  Procedure:  Procedure(s) (LRB): TOTAL KNEE ARTHROPLASTY (Left)   Consults: None  HPI:  Alexander Moss is a 73 y.o. year old male with end stage OA of his left knee with progressively worsening pain and dysfunction. He has constant pain, with activity and at rest and significant functional deficits with difficulties even with ADLs. He has had extensive non-op management including analgesics, injections of cortisone and viscosupplements, and home exercise program, but remains in significant pain with significant dysfunction. Radiographs show bone on bone arthritis medial and patellofemoral. He presents now for left Total Knee Arthroplasty.      Laboratory Data: Admission on 01/07/2019, Discharged on 01/08/2019  Component Date Value Ref Range Status  . WBC 01/08/2019 13.0* 4.0 - 10.5 K/uL Final  . RBC 01/08/2019 3.87* 4.22 - 5.81 MIL/uL Final  . Hemoglobin 01/08/2019 13.3  13.0 - 17.0 g/dL Final  . HCT 01/08/2019 38.9* 39.0 - 52.0 % Final  . MCV 01/08/2019 100.5* 80.0 - 100.0 fL Final  . MCH 01/08/2019 34.4* 26.0 - 34.0 pg Final  . MCHC 01/08/2019 34.2  30.0 - 36.0 g/dL Final  . RDW 01/08/2019 12.9  11.5 - 15.5 % Final  . Platelets 01/08/2019 219  150 - 400 K/uL Final  . nRBC 01/08/2019 0.0  0.0 - 0.2 % Final   Performed at [facility], [place] [address] [place], [place] [address]  . Sodium 01/08/2019 137  135 - 145 mmol/L Final  . Potassium 01/08/2019 4.3  3.5 - 5.1 mmol/L Final  . Chloride 01/08/2019 107  98 - 111 mmol/L Final  . CO2 01/08/2019 23  22 - 32 mmol/L Final  . Glucose, Bld 01/08/2019 138* 70 - 99 mg/dL Final  . BUN 01/08/2019 17  8 - 23 mg/dL Final  . Creatinine, Ser 01/08/2019 0.92  0.61 - 1.24 mg/dL Final  . Calcium 01/08/2019 8.3* 8.9 - 10.3 mg/dL Final  . GFR calc non Af Amer 01/08/2019 >60  >60 mL/min Final  . GFR calc Af Amer 01/08/2019 >60  >60 mL/min Final  . Anion gap 01/08/2019 7  5 - 15 Final   Performed at [facility], [place] [address], [place], [place] [address]  Hospital Outpatient Visit on 01/03/2019  Component Date Value Ref Range Status  . aPTT 01/03/2019 33  24 - 36 seconds Final   Performed at [facility], [place] [address], [place], [place] [address]  . WBC 01/03/2019 7.8  4.0 - 10.5 K/uL Final  . RBC 01/03/2019 4.33  4.22 - 5.81 MIL/uL Final  . Hemoglobin 01/03/2019 14.9  13.0 - 17.0 g/dL Final  . HCT 01/03/2019 43.2  39.0 - 52.0 % Final  . MCV 01/03/2019 99.8  80.0 - 100.0 fL Final  . MCH 01/03/2019 34.4* 26.0 - 34.0 pg Final  . MCHC 01/03/2019 34.5  30.0 -  36.0 g/dL Final  . RDW 01/03/2019 12.7  11.5 - 15.5 % Final  . Platelets 01/03/2019 218  150 - 400 K/uL Final  . nRBC 01/03/2019 0.0  0.0 - 0.2 % Final   Performed at [facility], [place] [address] [place], [place] [address]  . Sodium 01/03/2019 137  135 - 145 mmol/L Final  . Potassium 01/03/2019 4.3  3.5 - 5.1 mmol/L Final  . Chloride 01/03/2019 106  98 - 111 mmol/L Final  . CO2 01/03/2019 25  22 - 32 mmol/L Final  . Glucose, Bld 01/03/2019 92  70 - 99 mg/dL Final  . BUN 01/03/2019 20  8 - 23 mg/dL Final  . Creatinine, Ser 01/03/2019 1.03  0.61 - 1.24 mg/dL Final  . Calcium 01/03/2019 9.0  8.9 - 10.3 mg/dL Final  . Total Protein 01/03/2019 7.4  6.5 - 8.1 g/dL Final  . Albumin 01/03/2019  4.0  3.5 - 5.0 g/dL Final  . AST 01/03/2019 22  15 - 41 U/L Final  . ALT 01/03/2019 21  0 - 44 U/L Final  . Alkaline Phosphatase 01/03/2019 55  38 - 126 U/L Final  . Total Bilirubin 01/03/2019 0.8  0.3 - 1.2 mg/dL Final  . GFR calc non Af Amer 01/03/2019 >60  >60 mL/min Final  . GFR calc Af Amer 01/03/2019 >60  >60 mL/min Final  . Anion gap 01/03/2019 6  5 - 15 Final   Performed at [facility], [place] [address], [place], [place] [address]  . Prothrombin Time 01/03/2019 12.1  11.4 - 15.2 seconds Final  . INR 01/03/2019 0.9  0.8 - 1.2 Final   Comment: (NOTE) INR goal varies based on device and disease states. Performed at [facility], [place] [address], [place], [place] [address]   . ABO/RH(D) 01/03/2019 O POS   Final  . Antibody Screen 01/03/2019 NEG   Final  . Sample Expiration 01/03/2019 01/10/2019,2359   Final  . Extend sample reason 01/03/2019    Final                   Value:NO TRANSFUSIONS OR PREGNANCY IN THE PAST 3 MONTHS Performed at [facility], [place] [address] [place], [place] [address]   . MRSA, PCR 01/03/2019 NEGATIVE  NEGATIVE Final  . Staphylococcus aureus 01/03/2019 NEGATIVE  NEGATIVE Final   Comment: (NOTE) The Xpert SA Assay (FDA approved for NASAL specimens in patients 34 years of age and older), is one component of a comprehensive surveillance program. It is not intended to diagnose infection nor to guide or monitor treatment. Performed at [facility], [place] [address], [place],  [address]   Hospital Outpatient Visit on 01/03/2019  Component Date Value Ref Range Status  . SARS Coronavirus 2 01/03/2019 NEGATIVE  NEGATIVE Final   Comment: (NOTE) SARS-CoV-2 target nucleic acids are NOT DETECTED. The SARS-CoV-2 RNA is generally detectable in upper and lower respiratory specimens during the acute phase of infection. Negative results do not preclude SARS-CoV-2 infection, do not rule out  co-infections with other pathogens, and should not be used as the sole basis for treatment or other patient management decisions. Negative results must be combined with clinical observations, patient history, and epidemiological information. The expected result is Negative. Fact Sheet for Patients: SugarRoll.be Fact Sheet for Healthcare Providers: https://www.woods-mathews.com/ This test is not yet approved or cleared by the [place] FDA and  has been authorized for detection and/or diagnosis of SARS-CoV-2  by FDA under an Emergency Use Authorization (EUA). This EUA will remain  in effect (meaning this test can be used) for the duration of the COVID-19 declaration under Section 56                          4(b)(1) of the Act, 21 U.S.C. section 360bbb-3(b)(1), unless the authorization is terminated or revoked sooner. Performed at [facility], [place] [address], [place], [place] [address]      X-Rays:No results found.  EKG: Orders placed or performed during the hospital encounter of 08/24/18  . EKG 12 lead  . EKG 12 lead     Hospital Course: CAYDE HELD is a 73 y.o. who was admitted to [facility]. They were brought to the operating room on 01/07/2019 and underwent Procedure(s): TOTAL KNEE ARTHROPLASTY.  Patient tolerated the procedure well and was later transferred to the recovery room and then to the orthopaedic floor for postoperative care. They were given PO and IV analgesics for pain control following their surgery. They were given 24 hours of postoperative antibiotics of  Anti-infectives (From admission, onward)   Start     Dose/Rate Route Frequency Ordered Stop   01/07/19 1630  ceFAZolin (ANCEF) IVPB 2g/100 mL premix     2 g 200 mL/hr over 30 Minutes Intravenous Every 6 hours 01/07/19 1345 01/07/19 2308   01/07/19 0800  ceFAZolin (ANCEF) IVPB 2g/100 mL premix     2 g 200 mL/hr over 30 Minutes Intravenous On call  to O.R. 01/07/19 0745 01/07/19 1033     and started on DVT prophylaxis in the form of Aspirin.   PT and OT were ordered for total joint protocol. Discharge planning consulted to help with postop disposition and equipment needs.  Patient had a good night on the evening of surgery. They started to get up OOB with therapy on POD #0. Pt was seen during rounds and was ready to go home pending progress with therapy. Hemovac drain was pulled without difficulty. He worked with therapy on POD #1 and was meeting his goals. Pt was discharged to home later that day in stable condition.  Diet: Regular diet Activity: WBAT Follow-up: in 2 weeks Disposition: Home with outpatient PT at [facility] in [place] Discharged Condition: stable   Discharge Instructions    Call MD / Call 911   Complete by: As directed    If you experience chest pain or shortness of breath, CALL 911 and be transported to the hospital emergency room.  If you develope a fever above 101 F, pus (white drainage) or increased drainage or redness at the wound, or calf pain, call your surgeon's office.   Change dressing   Complete by: As directed    Change dressing on Wednesday, then change the dressing daily with sterile 4 x 4 inch gauze dressing and apply TED hose.   Constipation Prevention   Complete by: As directed    Drink plenty of fluids.  Prune juice may be helpful.  You may use a stool softener, such as Colace (over the counter) 100 mg twice a day.  Use MiraLax (over the counter) for constipation as needed.   Diet - low sodium heart healthy   Complete by: As directed    Discharge instructions   Complete by: As directed    Dr. Gaynelle Arabian Total Joint Specialist [facility] [address], [place], [place] [address] [phone]  TOTAL KNEE REPLACEMENT  POSTOPERATIVE DIRECTIONS  Knee Rehabilitation, Guidelines Following Surgery  Results after knee surgery are often greatly improved when you follow the exercise, range  of motion and muscle strengthening exercises prescribed by your doctor. Safety measures are also important to protect the knee from further injury. Any time any of these exercises cause you to have increased pain or swelling in your knee joint, decrease the amount until you are comfortable again and slowly increase them. If you have problems or questions, call your caregiver or physical therapist for advice.   HOME CARE INSTRUCTIONS  Remove items at home which could result in a fall. This includes throw rugs or furniture in walking pathways.  ICE to the affected knee every three hours for 30 minutes at a time and then as needed for pain and swelling.  Continue to use ice on the knee for pain and swelling from surgery. You may notice swelling that will progress down to the foot and ankle.  This is normal after surgery.  Elevate the leg when you are not up walking on it.   Continue to use the breathing machine which will help keep your temperature down.  It is common for your temperature to cycle up and down following surgery, especially at night when you are not up moving around and exerting yourself.  The breathing machine keeps your lungs expanded and your temperature down. Do not place pillow under knee, focus on keeping the knee straight while resting   DIET You may resume your previous home diet once your are discharged from the hospital.  DRESSING / WOUND CARE / SHOWERING You may change your dressing 3-5 days after surgery.  Then change the dressing every day with sterile gauze.  Please use good hand washing techniques before changing the dressing.  Do not use any lotions or creams on the incision until instructed by your surgeon. You may start showering once you are discharged home but do not submerge the incision under water. Just pat the incision dry and apply a dry gauze dressing on daily. Change the surgical dressing daily and reapply a dry dressing each time.  ACTIVITY Walk with your  walker as instructed. Use walker as long as suggested by your caregivers. Avoid periods of inactivity such as sitting longer than an hour when not asleep. This helps prevent blood clots.  You may resume a sexual relationship in one month or when given the OK by your doctor.  You may return to work once you are cleared by your doctor.  Do not drive a car for 6 weeks or until released by you surgeon.  Do not drive while taking narcotics.  WEIGHT BEARING Weight bearing as tolerated with assist device (walker, cane, etc) as directed, use it as long as suggested by your surgeon or therapist, typically at least 4-6 weeks.  POSTOPERATIVE CONSTIPATION PROTOCOL Constipation - defined medically as fewer than three stools per week and severe constipation as less than one stool per week.  One of the most common issues patients have following surgery is constipation.  Even if you have a regular bowel pattern at home, your normal regimen is likely to be disrupted due to multiple reasons following surgery.  Combination of anesthesia, postoperative narcotics, change in appetite and fluid intake all can affect your bowels.  In order to avoid complications following surgery, here are some recommendations in order to help you during your recovery period.  Colace (docusate) - Pick up an over-the-counter form of Colace or another stool softener  and take twice a day as long as you are requiring postoperative pain medications.  Take with a full glass of water daily.  If you experience loose stools or diarrhea, hold the colace until you stool forms back up.  If your symptoms do not get better within 1 week or if they get worse, check with your doctor.  Dulcolax (bisacodyl) - Pick up over-the-counter and take as directed by the product packaging as needed to assist with the movement of your bowels.  Take with a full glass of water.  Use this product as needed if not relieved by Colace only.   MiraLax (polyethylene  glycol) - Pick up over-the-counter to have on hand.  MiraLax is a solution that will increase the amount of water in your bowels to assist with bowel movements.  Take as directed and can mix with a glass of water, juice, soda, coffee, or tea.  Take if you go more than two days without a movement. Do not use MiraLax more than once per day. Call your doctor if you are still constipated or irregular after using this medication for 7 days in a row.  If you continue to have problems with postoperative constipation, please contact the office for further assistance and recommendations.  If you experience "the worst abdominal pain ever" or develop nausea or vomiting, please contact the office immediatly for further recommendations for treatment.  ITCHING  If you experience itching with your medications, try taking only a single pain pill, or even half a pain pill at a time.  You can also use Benadryl over the counter for itching or also to help with sleep.   TED HOSE STOCKINGS Wear the elastic stockings on both legs for three weeks following surgery during the day but you may remove then at night for sleeping.  MEDICATIONS See your medication summary on the "After Visit Summary" that the nursing staff will review with you prior to discharge.  You may have some home medications which will be placed on hold until you complete the course of blood thinner medication.  It is important for you to complete the blood thinner medication as prescribed by your surgeon.  Continue your approved medications as instructed at time of discharge.  PRECAUTIONS If you experience chest pain or shortness of breath - call 911 immediately for transfer to the hospital emergency department.  If you develop a fever greater that 101 F, purulent drainage from wound, increased redness or drainage from wound, foul odor from the wound/dressing, or calf pain - CONTACT YOUR SURGEON.                                                    FOLLOW-UP APPOINTMENTS Make sure you keep all of your appointments after your operation with your surgeon and caregivers. You should call the office at the above phone number and make an appointment for approximately two weeks after the date of your surgery or on the date instructed by your surgeon outlined in the "After Visit Summary".   RANGE OF MOTION AND STRENGTHENING EXERCISES  Rehabilitation of the knee is important following a knee injury or an operation. After just a few days of immobilization, the muscles of the thigh which control the knee become weakened and shrink (atrophy). Knee exercises are designed to build up the tone and strength of  the thigh muscles and to improve knee motion. Often times heat used for twenty to thirty minutes before working out will loosen up your tissues and help with improving the range of motion but do not use heat for the first two weeks following surgery. These exercises can be done on a training (exercise) mat, on the floor, on a table or on a bed. Use what ever works the best and is most comfortable for you Knee exercises include:  Leg Lifts - While your knee is still immobilized in a splint or cast, you can do straight leg raises. Lift the leg to 60 degrees, hold for 3 sec, and slowly lower the leg. Repeat 10-20 times 2-3 times daily. Perform this exercise against resistance later as your knee gets better.  Quad and Hamstring Sets - Tighten up the muscle on the front of the thigh (Quad) and hold for 5-10 sec. Repeat this 10-20 times hourly. Hamstring sets are done by pushing the foot backward against an object and holding for 5-10 sec. Repeat as with quad sets.  Leg Slides: Lying on your back, slowly slide your foot toward your buttocks, bending your knee up off the floor (only go as far as is comfortable). Then slowly slide your foot back down until your leg is flat on the floor again. Angel Wings: Lying on your back spread your legs to the side as far apart as  you can without causing discomfort.  A rehabilitation program following serious knee injuries can speed recovery and prevent re-injury in the future due to weakened muscles. Contact your doctor or a physical therapist for more information on knee rehabilitation.   IF YOU ARE TRANSFERRED TO A SKILLED REHAB FACILITY If the patient is transferred to a skilled rehab facility following release from the hospital, a list of the current medications will be sent to the facility for the patient to continue.  When discharged from the skilled rehab facility, please have the facility set up the patient's [place] prior to being released. Also, the skilled facility will be responsible for providing the patient with their medications at time of release from the facility to include their pain medication, the muscle relaxants, and their blood thinner medication. If the patient is still at the rehab facility at time of the two week follow up appointment, the skilled rehab facility will also need to assist the patient in arranging follow up appointment in our office and any transportation needs.  MAKE SURE YOU:  Understand these instructions.  Get help right away if you are not doing well or get worse.    Pick up stool softner and laxative for home use following surgery while on pain medications. Do not submerge incision under water. Please use good hand washing techniques while changing dressing each day. May shower starting three days after surgery. Please use a clean towel to pat the incision dry following showers. Continue to use ice for pain and swelling after surgery. Do not use any lotions or creams on the incision until instructed by your surgeon.   Do not put a pillow under the knee. Place it under the heel.   Complete by: As directed    Driving restrictions   Complete by: As directed    No driving for two weeks   TED hose   Complete by: As directed    Use stockings (TED hose) for  three weeks on both leg(s).  You may remove them at night for sleeping.  Weight bearing as tolerated   Complete by: As directed      Allergies as of 01/08/2019   No Known Allergies     Medication List    STOP taking these medications   naproxen sodium 220 MG tablet Commonly known as: ALEVE     TAKE these medications   aspirin 325 MG EC tablet Take 1 tablet (325 mg total) by mouth 2 (two) times daily for 20 days. Then take one 81 mg aspirin once a day for three weeks. Then discontinue aspirin.   CALCIUM 600 + D PO Take 1 tablet by mouth daily.   calcium carbonate 750 MG chewable tablet Commonly known as: TUMS EX Chew 750-1,500 mg by mouth daily as needed for heartburn.   fexofenadine 180 MG tablet Commonly known as: ALLEGRA Take 180 mg by mouth daily.   Fish Oil Ultra 1400 MG Caps Take 1,400 mg by mouth daily.   gabapentin 100 MG capsule Commonly known as: NEURONTIN Take 2 capsules (200 mg total) by mouth 3 (three) times daily. Take 200 mg three times a day for two weeks following surgery.Then take 200 mg two times a day for two weeks. Then take 200 mg once a day for two weeks. Then discontinue.   methocarbamol 500 MG tablet Commonly known as: ROBAXIN Take 1 tablet (500 mg total) by mouth every 6 (six) hours as needed for muscle spasms.   oxyCODONE 5 MG immediate release tablet Commonly known as: Oxy IR/ROXICODONE Take 1-2 tablets (5-10 mg total) by mouth every 6 (six) hours as needed for severe pain.   tamsulosin 0.4 MG Caps capsule Commonly known as: FLOMAX TAKE 1 CAPSULE(0.4 MG) BY MOUTH DAILY What changed:   how much to take  how to take this  when to take this   traMADol 50 MG tablet Commonly known as: ULTRAM Take 1-2 tablets (50-100 mg total) by mouth every 6 (six) hours as needed for moderate pain.   vitamin B-12 1000 MCG tablet Commonly known as: CYANOCOBALAMIN Take 1,000 mcg by mouth daily at 12 noon.   Vitamin E 400 units Tabs Take 400 Units  by mouth daily.   Zinc 25 MG Tabs Take 25 mg by mouth daily.            Discharge Care Instructions  (From admission, onward)         Start     Ordered   01/08/19 0000  Weight bearing as tolerated     01/08/19 0732   01/08/19 0000  Change dressing    Comments: Change dressing on Wednesday, then change the dressing daily with sterile 4 x 4 inch gauze dressing and apply TED hose.   01/08/19 0732         Follow-up Information    Gaynelle Arabian, MD. Schedule an appointment as soon as possible for a visit on 01/22/2019.   Specialty: Orthopedic Surgery Contact information: [address] [place] [address] [phone]           Signed: Theresa Duty, PA-C Orthopedic Surgery 01/09/2019, 1:50 PM

## 2019-01-14 ENCOUNTER — Ambulatory Visit: Payer: PPO | Admitting: Physical Therapy

## 2019-01-16 ENCOUNTER — Ambulatory Visit: Payer: PPO | Admitting: Physical Therapy

## 2019-01-21 ENCOUNTER — Ambulatory Visit: Payer: PPO | Admitting: Physical Therapy

## 2019-01-23 ENCOUNTER — Ambulatory Visit: Payer: PPO | Admitting: Physical Therapy

## 2019-01-23 DIAGNOSIS — N401 Enlarged prostate with lower urinary tract symptoms: Secondary | ICD-10-CM | POA: Diagnosis not present

## 2019-01-23 DIAGNOSIS — R351 Nocturia: Secondary | ICD-10-CM | POA: Diagnosis not present

## 2019-01-23 DIAGNOSIS — N390 Urinary tract infection, site not specified: Secondary | ICD-10-CM | POA: Diagnosis not present

## 2019-01-23 DIAGNOSIS — Z125 Encounter for screening for malignant neoplasm of prostate: Secondary | ICD-10-CM | POA: Diagnosis not present

## 2019-01-23 DIAGNOSIS — R3914 Feeling of incomplete bladder emptying: Secondary | ICD-10-CM | POA: Diagnosis not present

## 2019-01-28 ENCOUNTER — Ambulatory Visit: Payer: PPO | Admitting: Physical Therapy

## 2019-01-30 ENCOUNTER — Encounter: Payer: PPO | Admitting: Physical Therapy

## 2019-02-04 ENCOUNTER — Encounter: Payer: PPO | Admitting: Physical Therapy

## 2019-02-06 ENCOUNTER — Encounter: Payer: PPO | Admitting: Physical Therapy

## 2019-02-07 ENCOUNTER — Other Ambulatory Visit: Payer: Self-pay

## 2019-02-07 ENCOUNTER — Ambulatory Visit: Payer: PPO | Attending: Student | Admitting: Physical Therapy

## 2019-02-07 ENCOUNTER — Encounter: Payer: Self-pay | Admitting: Physical Therapy

## 2019-02-07 DIAGNOSIS — M25662 Stiffness of left knee, not elsewhere classified: Secondary | ICD-10-CM | POA: Diagnosis not present

## 2019-02-07 DIAGNOSIS — R262 Difficulty in walking, not elsewhere classified: Secondary | ICD-10-CM | POA: Insufficient documentation

## 2019-02-07 DIAGNOSIS — M25562 Pain in left knee: Secondary | ICD-10-CM | POA: Diagnosis not present

## 2019-02-07 DIAGNOSIS — M6281 Muscle weakness (generalized): Secondary | ICD-10-CM | POA: Insufficient documentation

## 2019-02-07 NOTE — Therapy (Signed)
[facility] [address], [place], [address] Phone: [phone]   Fax:  [phone]  Physical Therapy Evaluation  Patient Details  Name: Alexander Moss MRN: [id number] Date of Birth: [birth year] Referring Provider (PT): Alexander Moss   Encounter Date: 02/07/2019  PT End of Session - 02/07/19 1942    Visit Number  1    Number of Visits  12    Date for PT Re-Evaluation  03/21/19    Authorization Type  [employer] reporting period from 02/07/2019    Authorization Time Period  Current cert period 4/66/5993 - 03/21/2019 (latest PN: IE 02/07/2019)    Authorization - Visit Number  1    Authorization - Number of Visits  10    PT Start Time  5701    PT Stop Time  1515    PT Time Calculation (min)  60 min    Activity Tolerance  Patient tolerated treatment well    Behavior During Therapy  WFL for tasks assessed/performed       Past Medical History:  Diagnosis Date  . Arthritis   . GERD (gastroesophageal reflux disease)   . Hyperlipidemia   . Left inguinal hernia 08/2018    Past Surgical History:  Procedure Laterality Date  . [place]  . INGUINAL HERNIA REPAIR Left 09/07/2018   Procedure: OPEN LEFT HERNIA REPAIR - INGUINAL WITH MESH;  Surgeon: Alexander Maudlin, MD;  Location: [facility];  Service: General;  Laterality: Left;  . JOINT REPLACEMENT Right 2017   Knee  . KNEE SURGERY Bilateral 2003  . TONSILLECTOMY AND ADENOIDECTOMY    . TOTAL KNEE ARTHROPLASTY Right 05/23/2016   Procedure: RIGHT TOTAL KNEE ARTHROPLASTY;  Surgeon: Alexander Arabian, MD;  Location: [facility];  Service: Orthopedics;  Laterality: Right;  . TOTAL KNEE ARTHROPLASTY Left 01/07/2019   Procedure: TOTAL KNEE ARTHROPLASTY;  Surgeon: Alexander Arabian, MD;  Location: [facility];  Service: Orthopedics;  Laterality: Left;  34min    There were no vitals filed for this visit.  Subjective Assessment - 02/07/19 1435    Subjective   Patient states condition started 01/07/2019 when he had at L TKA after his arthritis worsened until he could not walk well. He got a UTI for 2 weeks when he had his surgery, which delayed his physical therapy. He did not have to stay in the hospital longer but it prevented him from starting physical therapy at home. He has now been referred to physical therapy to recover function. He has had no complications with the surgery. He had to get up every 15 min for a while to get to the bathroom. He has been moderately religious doing exercises as home.  Patient states he has a few shooting pains at night and when he first gets up after prolonged sitting. He had a R TKA in 2017 and the recovery went well.    Pertinent History  Patient is a 72 y.o. male who presents to outpatient physical therapy with a referral for medical diagnosis s/p L TKR performed on 01/07/2019 following gradual decrease in knee function and comfort due to advancing osteoarthritis. 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 Relevant past medical history and comorbidities include history of chronic back pain since he was 10 when he was crushed between concrete and a log (deneis paresthesia or leg symptoms), GERD (doing well currently), Hx of inguinal hernia (surgery about 6 months ago, no official restrictions but can feel discomfort there some doing SLR), Hx of R TKA in 2017 with full and satisfactory recovery.    Limitations  Standing;Walking;House hold activities;Lifting;Sitting;Other (comment)   going up and down ladders, pushing lawn  mower up hill, yardwork, volunteering building houses   How long can you sit comfortably?  no limitations    How long can you stand comfortably?  1 hour but would not be comfortable. maybe 10-15 min comfortable.    How long can you walk comfortably?  10 minutes    Diagnostic tests  see chart    Patient Stated Goals  To be able to climb a ladder to a roof so he can continue volunteering with [employer]. be able to walk a mile.    Currently in Pain?  No/denies    Pain Score  0-No pain   2/10 is the worst pain   Pain Location  Knee    Pain Orientation  Left    Pain Descriptors / Indicators  Shooting    Pain Type  Surgical pain;Chronic pain    Pain Radiating Towards  none    Pain Onset  More than a month ago    Pain Frequency  Intermittent    Aggravating Factors   sitting a long time (when he gets up), riding in a car with knee partly extended, walking a lot, steps without a hand rail    Pain Relieving Factors  rest, ice, movement in general    Effect of Pain on Daily Activities  has not yet returned to volunteering at [employer], stairs without handrail, has not gone fishing, increased fall risk, grass mowing especially push mower up hill, 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          [facility] PT Assessment - 02/07/19 0001      Assessment   Medical Diagnosis  L TKA 01/07/2019    Referring Provider (PT)  Alexander Moss    Onset Date/Surgical Date  01/07/19    Hand Dominance  Right    Next MD Visit  02/12/2019    Prior Therapy  none for this knee prior      Precautions   Precautions  Fall    Precaution Comments  don't over do it      Restrictions   Weight Bearing Restrictions  No      Balance Screen   Has the patient fallen in the past 6 months  No    Has the patient had a decrease in activity level because of a fear of falling?   No    Is the patient reluctant to leave their home because of a fear of falling?   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    Leisure  Volunteering with [employer], working in the shop, building boats, bush hog and mow grass, cut down trees and cut for firewood, go fishing      Cognition   Overall Cognitive Status  Within Functional Limits for tasks assessed      Observation/Other Assessments   Observations  see note from 02/07/2019 for latest objective exam.       6 minute walk test results    Aerobic Endurance Distance Walked  1155   no AD, no rests   Endurance additional comments  10 seconds no UE support, from low plinth       OBJECTIVE: OBSERVATION/INSPECTION: Patient presents with increased edema in entire L LE compared to R that pt states was not present prior to surgery. , incision appears to be healing well with some scabbing at distal in of incision site. No excessive redness, heat or swelling suggesting infection.  Patient stands with stooped posture, flexed at the hips and lacks lumbar lordosis. Weight shifted away from L LE.   PERIPHERAL JOINT MOTION (AROM/PROM in degrees):  *Indicates pain Hip = B WNL except restricted in extension, which is near 10 degrees R and neutral L Knee - Flexion: R = 125/130 cramping, L = 110/115 painful (soft,guarded end feel) - Extension: R = 0/0, L = -10/-10 (firm end feel) Ankle: B = WFL for basic mobility  STRENGTH:  *Indicates pain Hip  - Flexion: R =  5/5, L = 5/5. - Extension: R = 4/5, L = 4-/5 (B with and without knee flexion to 90 degrees) - Abduction: R = 5/5, L = 4+/5 Knee - Ext: R = 5/5, L = 5/5 (within available range) - Flex: R = 5/5, L = 5/5 (within available range) Ankle (seated position) - Able to heel walk and toe walk with BUE support.   ACCESSORY MOTION:  - No excessive laxity in L tibiofemoral joint AP or PA directions. Mildly hypomobile.  PALPATION: - TTP over incision   FUNCTIONAL MOBILITY: - Bed mobility: independent with supine to sit and rolling.  - Transfers: sit <> stand  - Gait: stooped posture, left toe out, lack of L knee extension at heel strike, slight lateral sway during 6MWT.  - Stairs: step over step with no UE support second try (first try with BUE support). Moves quickly, slightly uncontrolled  FUNCTIONAL/BALANCE TESTS: - 5 Times Sit To Stand test: 10 seconds no UE support  - Narrow stance, firm surface, eyes open: 30 seconds - Narrow stance, firm surface, eyes closed: 30 seconds - Tandem stance, firm surface, eyes open: R = 30 seconds, L = 30 seconds - Single leg stance, firm surface, eyes open: R= 9 seconds, L= 14 seconds - Six Minute Walk Test: 1155 feet, no AD, no rests  EDUCATION/COGNITION: Patient is alert and oriented X 4.   Objective measurements completed on examination: See above findings.     TREATMENT:   Therapeutic exercise: to centralize symptoms and improve ROM, strength, muscular endurance, and activity tolerance required for successful completion of functional activities.  - supine heel slide with strap with 5 second hold at end range x  5 - seated quad set with foot on floor and self overpressure, 5 second hold, x 5 - education on scar massage - Education on HEP including handout  - Education on diagnosis, prognosis, POC, anatomy and physiology of current condition.   HOME EXERCISE PROGRAM Access Code: [email or web address]  URL: [email or web address]  Date: 02/07/2019   Prepared by: Rosita Kea   Exercises  Supine Heel Slide with Strap - 1-3 sets - 10-20 reps - 5 seconds hold - 1-2x daily - 7x weekly  Seated Quad Set - 1-3 sets - 10-20 reps - 5 seconds hold - 1-2x daily - 7x weekly  Patient Education  Scar Massage   Patient response to treatment:  Pt tolerated treatment well. Pt was able to complete all exercises with mild end range discomfort. Advised to ice after exercises. Pt required multimodal cuing for proper technique and to facilitate improved neuromuscular control, strength, range of motion, and functional ability resulting in improved performance and form.     PT Education - 02/07/19 1941    Education provided  Yes    Education Details  Exercise purpose/form. Self management techniques. Education on diagnosis, prognosis, POC, anatomy and physiology of current condition Education on HEP including handout    Person(s) Educated  Patient    Methods  Explanation;Demonstration;Tactile cues;Verbal cues;Handout    Comprehension  Verbalized understanding;Returned demonstration       PT Short Term Goals - 02/07/19 1945      PT SHORT TERM GOAL #1   Title  Be independent with initial home exercise program for self-management of symptoms.    Baseline  initial HEP provided at IE (02/07/2019);    Time  2    Period  Weeks    Status  New    Target Date  02/21/19        PT Long Term Goals - 02/07/19 1944      PT LONG TERM GOAL #1   Title  Be independent with a long-term home exercise program for self-management of symptoms.    Baseline  initial HEP provided at IE (02/07/2019);    Time  6    Period  Weeks    Status  New    Target Date  03/21/19      PT LONG TERM GOAL #2   Title  Improve L knee  PROM to 0-125 to allow patient to complete valued activities with less difficulty.    Baseline  10-0-15 with pain (02/07/2019);    Time  6    Period  Weeks    Status  New    Target Date  03/21/19      PT LONG TERM GOAL #3   Title  Patient will  improve 6MWT to equal or greater than 1730 feet to reach norm for his age group based on research to demonstrate improved gait speed and functional endurance for community mobility.    Baseline  1155 feet, no AD, no rests (02/07/2019);    Time  6    Period  Weeks    Status  New    Target Date  03/21/19      PT LONG TERM GOAL #4   Title  Patient will be able to balance on single leg stance for equal or greater than 30 seconds each leg in order to improve balance for functional activities that require single leg stance such as walking.    Baseline  Single leg stance, firm surface, eyes open: R= 9 seconds, L= 14 seconds (02/07/2019);  Time  6    Period  Weeks    Status  New    Target Date  03/21/19      PT LONG TERM GOAL #5   Title  Complete community, work and/or recreational activities without limitation due to current condition.    Baseline  has not yet returned to volunteering at 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 male referred to outpatient physical therapy with a medical diagnosis of OA of left knee joint who presents with signs and symptoms consistent with L knee pain, stiffness, weakness, and difficulty walking s/p L TKA 01/07/2019. Patient presents with significant pain, stiffness, swelling, weakness, balance, activity tolerance, and hypomobility impairments that are limiting ability to complete usual activities such as building houses, climbing ladders, volunteer work, working in the shop, prolonged sitting and standing, walking long distances, transferring, climbing ladders, walking on roof, and traveling without difficulty. Patient will benefit from skilled physical therapy  intervention to address current body structure impairments and activity limitations to improve function and work towards goals set in current POC in order to return to prior level of function or maximal functional improvement.    Personal Factors and Comorbidities  Age;Comorbidity 3+;Time since onset of injury/illness/exacerbation    Comorbidities  history of chronic back pain since he was 10 when he was crushed between concrete and a log (deneis paresthesia or leg symptoms), GERD (doing well currently), Hx of inguinal hernia (surgery about 6 months ago, no official restrictions but can feel discomfort there some doing SLR), Hx of R TKA in 2017 with full and satisfactory recovery.    Examination-Activity Limitations  Squat;Lift;Stairs;Bend;Stand;Transfers;Sleep   building houses, climbing ladders, volunteer work, working in [employer], prolonged sitting and standing, walking long distances, transferring   Training and development officer;Yard Work;Community Activity;Driving;Interpersonal Relationship;Other   building houses, climbing ladders, volunteer work, working in the shop, prolonged sitting and standing, walking long distances, transferring, climbing ladders, walking on the roof   Stability/Clinical Decision Making  Stable/Uncomplicated    Clinical Decision Making  Low    Rehab Potential  Good    Clinical Impairments Affecting Rehab Potential  (+) Very motivated, (-) h/o chronic low back pain    PT Frequency  2x / week    PT Duration  6 weeks    PT Treatment/Interventions  ADLs/Self Care Home Management;Cryotherapy;Electrical Stimulation;Moist Heat;Iontophoresis 4mg /ml Dexamethasone;Ultrasound;Contrast Bath;DME Instruction;Gait training;Stair training;Functional mobility training;Therapeutic activities;Therapeutic exercise;Balance training;Neuromuscular re-education;Patient/family education;Manual techniques;Scar mobilization;Passive range of motion;Compression bandaging;Dry  needling;Energy conservation;Taping    PT Next Visit Plan  manual stretching and mobility work, strengthening, balance    PT Home Exercise Plan  Medbridge Access Code: [email or web address]    Consulted and Agree with Plan of Care  Patient       Patient will benefit from skilled therapeutic intervention in order to improve the following deficits and impairments:  Abnormal gait, Decreased activity tolerance, Decreased balance, Decreased endurance, Decreased range of motion, Decreased scar mobility, Decreased strength, Difficulty walking, Increased edema, Increased fascial restricitons, Increased muscle spasms, Impaired perceived functional ability, Impaired flexibility, Improper body mechanics, Postural dysfunction,  Pain, Decreased skin integrity, Hypomobility  Visit Diagnosis: 1. Left knee pain, unspecified chronicity   2. Stiffness of left knee, not elsewhere classified   3. Difficulty in walking, not elsewhere classified   4. Muscle weakness (generalized)        Problem List Patient Active Problem List   Diagnosis Date Noted  . Non-recurrent unilateral inguinal hernia without obstruction or gangrene 08/29/2018  . OA (osteoarthritis) of knee 05/23/2016  . Hearing loss 01/06/2015  . Chronic back pain 12/09/2014  . GERD (gastroesophageal reflux disease) 12/09/2014  . Obesity 12/09/2014  . PSA Elevation 12/09/2014  . Rosacea 12/09/2014  . Family history of GI tract cancer 07/09/2008  . Lumbago 06/25/2007  . Diverticulosis of colon without hemorrhage 09/08/2006    Everlean Alstrom. Graylon Good, PT, DPT 02/07/19, 8:01 PM  [facility] [address], [place], [address] Phone: [phone]   Fax:  [phone]  Name: Alexander [facility] MRN: [id number] Date of Birth: 1945/10/31

## 2019-02-11 ENCOUNTER — Encounter: Payer: Self-pay | Admitting: Physical Therapy

## 2019-02-11 ENCOUNTER — Encounter: Payer: PPO | Admitting: Physical Therapy

## 2019-02-11 ENCOUNTER — Ambulatory Visit: Payer: PPO | Attending: Student | Admitting: Physical Therapy

## 2019-02-11 ENCOUNTER — Other Ambulatory Visit: Payer: Self-pay

## 2019-02-11 DIAGNOSIS — M25562 Pain in left knee: Secondary | ICD-10-CM | POA: Insufficient documentation

## 2019-02-11 DIAGNOSIS — R972 Elevated prostate specific antigen [PSA]: Secondary | ICD-10-CM | POA: Diagnosis not present

## 2019-02-11 DIAGNOSIS — M25662 Stiffness of left knee, not elsewhere classified: Secondary | ICD-10-CM | POA: Insufficient documentation

## 2019-02-11 DIAGNOSIS — R3914 Feeling of incomplete bladder emptying: Secondary | ICD-10-CM | POA: Diagnosis not present

## 2019-02-11 DIAGNOSIS — R262 Difficulty in walking, not elsewhere classified: Secondary | ICD-10-CM | POA: Insufficient documentation

## 2019-02-11 DIAGNOSIS — M6281 Muscle weakness (generalized): Secondary | ICD-10-CM | POA: Insufficient documentation

## 2019-02-11 DIAGNOSIS — N401 Enlarged prostate with lower urinary tract symptoms: Secondary | ICD-10-CM | POA: Diagnosis not present

## 2019-02-11 DIAGNOSIS — R338 Other retention of urine: Secondary | ICD-10-CM | POA: Diagnosis not present

## 2019-02-11 NOTE — Therapy (Signed)
[facility] [address], [place], [address] Phone: [phone]   Fax:  [phone]  Physical Therapy Treatment  Patient Details  Name: Alexander Moss MRN: [id number] Date of Birth: 08/02/1945 Referring Provider (PT): Dione Plover. Aluisio   Encounter Date: 02/11/2019    Past Medical History:  Diagnosis Date  . Arthritis   . GERD (gastroesophageal reflux disease)   . Hyperlipidemia   . Left inguinal hernia 08/2018    Past Surgical History:  Procedure Laterality Date  . [place]  . INGUINAL HERNIA REPAIR Left 09/07/2018   Procedure: OPEN LEFT HERNIA REPAIR - INGUINAL WITH MESH;  Surgeon: Fredirick Maudlin, MD;  Location: [facility];  Service: General;  Laterality: Left;  . JOINT REPLACEMENT Right 2017   Knee  . KNEE SURGERY Bilateral 2003  . TONSILLECTOMY AND ADENOIDECTOMY    . TOTAL KNEE ARTHROPLASTY Right 05/23/2016   Procedure: RIGHT TOTAL KNEE ARTHROPLASTY;  Surgeon: Gaynelle Arabian, MD;  Location: [facility];  Service: Orthopedics;  Laterality: Right;  . TOTAL KNEE ARTHROPLASTY Left 01/07/2019   Procedure: TOTAL KNEE ARTHROPLASTY;  Surgeon: Gaynelle Arabian, MD;  Location: [facility];  Service: Orthopedics;  Laterality: Left;  86min    There were no vitals filed for this visit.  Subjective Assessment - 02/11/19 1636    Subjective  Patient reports he over-did it on Saturday doing yardwork including a lot of brushogging and has been having aching at night that makes it hard to sleep. He states he felt good following last physical therapy session and has been doing his HEP.    Pertinent History  Patient is a 73 y.o. male who presents to outpatient physical therapy with a referral for medical diagnosis s/p L TKR performed on 01/07/2019 following gradual decrease in knee function and comfort due to advancing osteoarthritis. 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 Relevant past medical history and comorbidities include history of chronic back pain since he was 10 when he was crushed between concrete and a log (deneis paresthesia or leg symptoms), GERD (doing well currently), Hx of inguinal hernia (surgery about 6 months ago, no official restrictions but can feel discomfort there some doing SLR), Hx of R TKA in 2017 with full and satisfactory recovery.    Limitations  Standing;Walking;House hold activities;Lifting;Sitting;Other (comment)   going up and down ladders, pushing lawn mower up hill, yardwork, volunteering building houses   How long can you sit comfortably?  no limitations    How long can you stand comfortably?  1 hour but would not be comfortable. maybe 10-15 min comfortable.    How long can you walk comfortably?  10 minutes    Diagnostic tests  see chart    Patient Stated Goals  To be able to climb a ladder to a roof so he can continue volunteering with [employer]. be able to walk a mile.    Currently in Pain?  No/denies    Pain Onset  More than a month ago       TREATMENT:   Therapeutic exercise:to centralize symptoms and improve ROM, strength, muscular endurance,  and activity tolerance required for successful completion of functional activities.  - Recumbent Bike no resistance.. For improved lower extremity ROM, muscular endurance, and activity tolerance; and to induce the analgesic effect of aerobic exercise, stimulate joint nutrition, and prepare body structures and systems for following interventions. x 5  Minutes at start of session with seat adjusted to 10 for freely  moving knee to improve synovial fluid exchange. Repeated for 3 min following manual at end of session. (manual therapy - see below) - Short arc quad with leg elevated 2x20, to help evacuate swelling.  - leg elevated heel pumps x 20 to help evacuate swelling - measurent of L knee extension and flexion (-10-0-115 AROM - OP at 115 able to tolerate to 117 firm end feel).  Manual therapy: to reduce pain and tissue tension, improve range of motion, neuromodulation, in order to promote improved ability to complete functional activities. - STM for improved lymph and venous return along L thigh, around patella and knee joint, lower leg.  - scar massage with and without LAX ball assist to improve skin glide over underlying structures. Included removing dead skin and education for home care of scar. Noted for one location at distal incision with scabbing still in place - avoided.   - Education on diagnosis, prognosis, POC, anatomy and physiology of current condition. Icing.  - L knee extension with oscillating 2-3 second OP as tolerated x 10.   HOME EXERCISE PROGRAM Access Code: [email or web address]  URL: [email or web address]  Date: 02/07/2019  Prepared by: Rosita Kea   Exercises   Supine Heel Slide with Strap - 1-3 sets - 10-20 reps - 5 seconds hold - 1-2x daily - 7x weekly   Seated Quad Set - 1-3 sets - 10-20 reps - 5 seconds hold - 1-2x daily - 7x weekly  Patient Education  Scar Massage   PT Education - 02/11/19 1647    Education provided  Yes    Education Details  Exercise purpose/form. Self management techniques.    Person(s) Educated  Patient    Methods  Explanation;Demonstration;Tactile cues;Verbal cues    Comprehension  Verbalized understanding;Returned demonstration;Verbal cues required;Tactile cues required       PT Short Term Goals - 02/07/19 1945      PT SHORT TERM GOAL #1   Title  Be independent with initial home exercise program for self-management of symptoms.     Baseline  initial HEP provided at IE (02/07/2019);    Time  2    Period  Weeks    Status  New    Target Date  02/21/19        PT Long Term Goals - 02/07/19 1944      PT LONG TERM GOAL #1   Title  Be independent with a long-term home exercise program for self-management of symptoms.    Baseline  initial HEP provided at IE (02/07/2019);    Time  6    Period  Weeks    Status  New    Target Date  03/21/19      PT LONG TERM GOAL #2   Title  Improve L knee  PROM to 0-125 to allow patient to complete valued activities with less difficulty.    Baseline  10-0-15 with pain (02/07/2019);    Time  6    Period  Weeks    Status  New    Target Date  03/21/19      PT LONG TERM GOAL #3   Title  Patient will  improve 6MWT to equal or greater than 1730 feet to reach norm for his age group based on research to demonstrate improved gait speed and functional endurance for community mobility.    Baseline  1155 feet, no AD, no rests (02/07/2019);    Time  6    Period  Weeks    Status  New    Target Date  03/21/19      PT LONG TERM GOAL #4   Title  Patient will be able to balance on single leg stance for equal or greater than 30 seconds each leg in order to improve balance for functional activities that require single leg stance such as walking.    Baseline  Single leg stance, firm surface, eyes open: R= 9 seconds, L= 14 seconds (02/07/2019);    Time  6    Period  Weeks    Status  New    Target Date  03/21/19      PT LONG TERM GOAL #5   Title  Complete community, work and/or recreational activities without limitation due to current condition.    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 Noted for decreased swelling in L LE this session and improved motion following manual therapy. Treatment focused on improving joint mobility and decreasing swelling to improve motion due to patient report of high level of activity causing increased pain since last session. Patient had some transient discomfort with overpressure stretching and scar mobilization that receded to zero by end of session. Patient would benefit from continued physical therapy to address remaining impairments and functional limitations to work towards stated goals and return to PLOF or maximal functional independence.    Personal Factors and Comorbidities  Age;Comorbidity 3+;Time since onset of injury/illness/exacerbation    Comorbidities  history of chronic back pain since he was 10 when he was crushed between concrete and a log (deneis paresthesia or leg symptoms), GERD (doing well currently), Hx of inguinal hernia (surgery about 6 months ago, no official restrictions but can feel discomfort there some doing SLR), Hx of R TKA in 2017 with full and satisfactory recovery.    Examination-Activity Limitations  Squat;Lift;Stairs;Bend;Stand;Transfers;Sleep   building houses, climbing ladders, volunteer work, working in [employer], prolonged sitting and standing, walking long distances, transferring   Training and development officer;Yard Work;Community Activity;Driving;Interpersonal Relationship;Other   building houses, climbing ladders, volunteer work, working in the shop, prolonged sitting and standing, walking long distances, transferring, climbing ladders, walking on the roof   Stability/Clinical Decision Making  Stable/Uncomplicated    Rehab Potential  Good    Clinical Impairments Affecting Rehab Potential  (+) Very motivated, (-) h/o chronic low back pain    PT Frequency  2x / week     PT Duration  6 weeks    PT Treatment/Interventions  ADLs/Self Care Home Management;Cryotherapy;Electrical Stimulation;Moist Heat;Iontophoresis 4mg /ml Dexamethasone;Ultrasound;Contrast Bath;DME Instruction;Gait training;Stair training;Functional mobility training;Therapeutic activities;Therapeutic exercise;Balance training;Neuromuscular re-education;Patient/family education;Manual techniques;Scar mobilization;Passive range of motion;Compression bandaging;Dry  needling;Energy conservation;Taping    PT Next Visit Plan  manual stretching and mobility work, strengthening, balance    PT Home Exercise Plan  Medbridge Access Code: [email or web address]    Consulted and Agree with Plan of Care  Patient       Patient will benefit from skilled therapeutic intervention in order to improve the following deficits and impairments:  Abnormal gait, Decreased activity tolerance, Decreased balance, Decreased endurance, Decreased range of motion, Decreased scar mobility, Decreased strength, Difficulty walking, Increased edema, Increased fascial restricitons, Increased muscle spasms, Impaired perceived functional ability, Impaired flexibility, Improper body mechanics, Postural dysfunction, Pain, Decreased skin integrity, Hypomobility  Visit Diagnosis: 1. Left knee pain, unspecified chronicity   2. Stiffness of left knee, not elsewhere classified   3. Difficulty in walking, not elsewhere classified   4. Muscle weakness (generalized)        Problem List Patient Active Problem List   Diagnosis Date Noted  . Non-recurrent unilateral inguinal hernia without obstruction or gangrene 08/29/2018  . OA (osteoarthritis) of knee 05/23/2016  . Hearing loss 01/06/2015  . Chronic back pain 12/09/2014  . GERD (gastroesophageal reflux disease) 12/09/2014  . Obesity 12/09/2014  . PSA Elevation 12/09/2014  . Rosacea 12/09/2014  . Family history of GI tract cancer 07/09/2008  . Lumbago 06/25/2007  . Diverticulosis of colon without  hemorrhage 09/08/2006    Everlean Alstrom. Graylon Good, PT, DPT 02/11/19, 4:49 PM  [facility] [address] [place], [address] Phone: [phone]   Fax:  [phone]  Name: Alexander Moss MRN: [id number] Date of Birth: March 20, 1946

## 2019-02-12 ENCOUNTER — Encounter: Payer: Self-pay | Admitting: Family Medicine

## 2019-02-12 ENCOUNTER — Other Ambulatory Visit: Payer: Self-pay

## 2019-02-12 DIAGNOSIS — D3131 Benign neoplasm of right choroid: Secondary | ICD-10-CM | POA: Diagnosis not present

## 2019-02-12 DIAGNOSIS — N401 Enlarged prostate with lower urinary tract symptoms: Secondary | ICD-10-CM | POA: Diagnosis not present

## 2019-02-12 DIAGNOSIS — R3914 Feeling of incomplete bladder emptying: Secondary | ICD-10-CM | POA: Diagnosis not present

## 2019-02-12 DIAGNOSIS — H524 Presbyopia: Secondary | ICD-10-CM | POA: Diagnosis not present

## 2019-02-12 DIAGNOSIS — N390 Urinary tract infection, site not specified: Secondary | ICD-10-CM | POA: Diagnosis not present

## 2019-02-12 DIAGNOSIS — Z96652 Presence of left artificial knee joint: Secondary | ICD-10-CM | POA: Diagnosis not present

## 2019-02-12 DIAGNOSIS — H35363 Drusen (degenerative) of macula, bilateral: Secondary | ICD-10-CM | POA: Diagnosis not present

## 2019-02-12 DIAGNOSIS — H43393 Other vitreous opacities, bilateral: Secondary | ICD-10-CM | POA: Diagnosis not present

## 2019-02-12 DIAGNOSIS — R351 Nocturia: Secondary | ICD-10-CM | POA: Diagnosis not present

## 2019-02-12 DIAGNOSIS — H25013 Cortical age-related cataract, bilateral: Secondary | ICD-10-CM | POA: Diagnosis not present

## 2019-02-12 DIAGNOSIS — H35013 Changes in retinal vascular appearance, bilateral: Secondary | ICD-10-CM | POA: Diagnosis not present

## 2019-02-12 DIAGNOSIS — Z471 Aftercare following joint replacement surgery: Secondary | ICD-10-CM | POA: Diagnosis not present

## 2019-02-12 NOTE — Patient Outreach (Signed)
[facility]) Care Management  02/12/2019  Alexander Moss 1945-11-02 [id number]    Late entry  [name] closing the program.  Patient is transitioning to external program [facility] for continued case management.  [name] RN, BSN, [place] Direct Dial:  [phone]  Fax: [phone]

## 2019-02-13 ENCOUNTER — Encounter: Payer: PPO | Admitting: Physical Therapy

## 2019-02-13 ENCOUNTER — Ambulatory Visit: Payer: PPO | Admitting: Physical Therapy

## 2019-02-13 DIAGNOSIS — R3914 Feeling of incomplete bladder emptying: Secondary | ICD-10-CM | POA: Diagnosis not present

## 2019-02-13 DIAGNOSIS — R972 Elevated prostate specific antigen [PSA]: Secondary | ICD-10-CM | POA: Diagnosis not present

## 2019-02-13 DIAGNOSIS — R338 Other retention of urine: Secondary | ICD-10-CM | POA: Diagnosis not present

## 2019-02-13 DIAGNOSIS — N401 Enlarged prostate with lower urinary tract symptoms: Secondary | ICD-10-CM | POA: Diagnosis not present

## 2019-02-14 ENCOUNTER — Encounter: Payer: PPO | Admitting: Physical Therapy

## 2019-02-18 ENCOUNTER — Ambulatory Visit: Payer: PPO | Admitting: Physical Therapy

## 2019-02-18 ENCOUNTER — Other Ambulatory Visit: Payer: Self-pay

## 2019-02-18 ENCOUNTER — Encounter: Payer: Self-pay | Admitting: Physical Therapy

## 2019-02-18 ENCOUNTER — Encounter: Payer: PPO | Admitting: Physical Therapy

## 2019-02-18 DIAGNOSIS — M25562 Pain in left knee: Secondary | ICD-10-CM

## 2019-02-18 DIAGNOSIS — M25662 Stiffness of left knee, not elsewhere classified: Secondary | ICD-10-CM

## 2019-02-18 DIAGNOSIS — M6281 Muscle weakness (generalized): Secondary | ICD-10-CM

## 2019-02-18 DIAGNOSIS — R262 Difficulty in walking, not elsewhere classified: Secondary | ICD-10-CM

## 2019-02-18 NOTE — Therapy (Signed)
[facility] [address], [place], [address] Phone: [phone]   Fax:  [phone]  Physical Therapy Treatment  Patient Details  Name: Alexander Moss MRN: [id number] Date of Birth: 1945-11-11 Referring Provider (PT): Dione Plover. Aluisio   Encounter Date: 02/18/2019  PT End of Session - 02/18/19 1419    Visit Number  3    Number of Visits  12    Date for PT Re-Evaluation  03/21/19    Authorization Type  [employer] reporting period from 02/07/2019    Authorization Time Period  Current cert period 3/71/0626 - 03/21/2019 (latest PN: [facility] 02/07/2019)    Authorization - Visit Number  3    Authorization - Number of Visits  10    PT Start Time  0900    PT Stop Time  0945    PT Time Calculation (min)  45 min    Activity Tolerance  Patient tolerated treatment well    Behavior During Therapy  [facility] for tasks assessed/performed       Past Medical History:  Diagnosis Date  . Arthritis   . GERD (gastroesophageal reflux disease)   . Hyperlipidemia   . Left inguinal hernia 08/2018    Past Surgical History:  Procedure Laterality Date  . [place]  . INGUINAL HERNIA REPAIR Left 09/07/2018   Procedure: OPEN LEFT HERNIA REPAIR - INGUINAL WITH MESH;  Surgeon: Fredirick Maudlin, MD;  Location: [facility] OR[facility];  Service: General;  Laterality: Left;  . JOINT REPLACEMENT Right 2017   Knee  . KNEE SURGERY Bilateral 2003  . TONSILLECTOMY AND ADENOIDECTOMY    . TOTAL KNEE ARTHROPLASTY Right 05/23/2016   Procedure: RIGHT TOTAL KNEE ARTHROPLASTY;  Surgeon: Gaynelle Arabian, MD;  Location: [facility];  Service: Orthopedics;  Laterality: Right;  . TOTAL KNEE ARTHROPLASTY Left 01/07/2019   Procedure: TOTAL KNEE ARTHROPLASTY;  Surgeon: Gaynelle Arabian, MD;  Location: [facility];  Service: Orthopedics;  Laterality: Left;  46min    There were no vitals filed for this visit.  Subjective Assessment - 02/18/19 0907    Subjective   Patient reports he saw his referring physician who was very pleased with his progress and ROM and reccomended 2 more PT sessions, then to discharge. 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 He reports minimal pain at medial anterior L knee upon arrival. States he felt great following last treatment session. Has not yet tried climbing a ladder yet.    Pertinent History  Patient is a 73 y.o. male who presents to outpatient physical therapy with a referral for medical diagnosis s/p L TKR performed on 01/07/2019 following gradual decrease in knee function and comfort due to advancing osteoarthritis. 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 Relevant past medical history and comorbidities include history of chronic back pain since he was 10 when he was crushed between concrete and a log (deneis paresthesia or leg symptoms), GERD (doing well currently), Hx of inguinal hernia (surgery about 6 months ago, no official restrictions but can feel discomfort  there some doing SLR), Hx of R TKA in 2017 with full and satisfactory recovery.    Limitations  Standing;Walking;House hold activities;Lifting;Sitting;Other (comment)   going up and down ladders, pushing lawn mower up hill, yardwork, volunteering building houses   How long can you sit comfortably?  no limitations    How long can you stand comfortably?  1 hour but would not be comfortable. maybe 10-15 min comfortable.    How long can you walk comfortably?  10 minutes     Diagnostic tests  see chart    Patient Stated Goals  To be able to climb a ladder to a roof so he can continue volunteering with [employer]. be able to walk a mile.    Currently in Pain?  Yes    Pain Score  1     Pain Location  Knee    Pain Orientation  Anterior;Medial    Pain Type  Surgical pain    Pain Onset  More than a month ago        OBJECTIVE: L knee ROM (in degrees, last measured 02/18/2019): - Flexion: 125 AAROM on bike, 120 AROM supine on table.   - Extension: -10 AROM & PROM supine on table  TREATMENT:  Therapeutic exercise:to centralize symptoms and improve ROM, strength, muscular endurance, and activity tolerance required for successful completion of functional activities. - Recumbent Bike no resistance.. For improved lower extremity ROM, muscular endurance, and activity tolerance; and to induce the analgesic effect of aerobic exercise, stimulate joint nutrition, and prepare body structures and systems for following interventions. x 5  Minutes with seat at level 6 to allow free rotation, then 5 more min at level 5 for improved stretch. Patient sore following but improved L knee flexion AAROM to 125 degrees.    - walking around clinic between activities for an active rest and to allow discomfort to dissipate prior to next exercise.   Therapeutic activities: for functional strengthening and improved functional activity tolerance. - up and down 10 foot A-ladder step over step with BUE support and SBA. Able to complete step over step confidently. To prepare for return to building houses. - star step and push back over 6 inch hurdles. Forward, forward diagonal, backwards diagonal, backwards, and baclwards crossed x 5 cycles each side. To simulate and train push off and balance pt will require for transferring from ladder to roof when building houses.  - step up to 12 inch step x 10 sequentially each side with touch down UE support as needed. 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 Very winded by end of each set.   Manual therapy: to reduce pain and tissue tension, improve range of motion, neuromodulation, in order to promote improved ability to complete functional activities. - STM for improved lymph and venous return along L thigh, around patella and knee joint, lower leg.  - mild scar massage to improve skin glide over underlying structures.  -Education on diagnosis, prognosis, POC, anatomy and physiology of current condition.Icing.    HOME EXERCISE PROGRAM Access Code: [email or web address]  URL: [email or web address]  Date: 02/18/2019  Prepared by: Rosita Kea   Exercises  Supine Heel Slide with Strap - 1-3 sets - 10-20 reps - 5 seconds hold - 1-2x daily - 7x weekly  Seated Quad Set - 1-3 sets - 10-20 reps - 5 seconds hold - 1-2x daily - 7x weekly  Hamstring stretch (with strap) - 3 reps - 30-60  seconds hold - 2x daily - 7x weekly  Patient Education  Scar Massage  PT Education - 02/18/19 1419    Education provided  Yes    Education Details  Exercise purpose/form. Self management techniques.    Person(s) Educated  Patient    Methods  Explanation;Demonstration;Tactile cues;Verbal cues    Comprehension  Verbalized understanding;Returned demonstration       PT Short Term Goals - 02/07/19 1945      PT SHORT TERM GOAL #1   Title  Be independent with initial home exercise program for self-management of symptoms.    Baseline  initial HEP provided at IE (02/07/2019);    Time  2    Period  Weeks    Status  New    Target Date  02/21/19        PT Long Term Goals - 02/07/19 1944      PT LONG TERM GOAL #1   Title  Be independent with a long-term home exercise program for self-management of symptoms.    Baseline  initial HEP provided at IE (02/07/2019);    Time  6    Period  Weeks    Status  New    Target Date  03/21/19      PT LONG TERM GOAL #2   Title  Improve L knee  PROM  to 0-125 to allow patient to complete valued activities with less difficulty.    Baseline  10-0-15 with pain (02/07/2019);    Time  6    Period  Weeks    Status  New    Target Date  03/21/19      PT LONG TERM GOAL #3   Title  Patient will improve 6MWT to equal or greater than 1730 feet to reach norm for his age group based on research to demonstrate improved gait speed and functional endurance for community mobility.    Baseline  1155 feet, no AD, no rests (02/07/2019);    Time  6    Period  Weeks    Status  New    Target Date  03/21/19      PT LONG TERM GOAL #4   Title  Patient will be able to balance on single leg stance for equal or greater than 30 seconds each leg in order to improve balance for functional activities that require single leg stance such as walking.    Baseline  Single leg stance, firm surface, eyes open: R= 9 seconds, L= 14 seconds (02/07/2019);    Time  6    Period  Weeks    Status  New    Target Date  03/21/19      PT LONG TERM GOAL #5   Title  Complete community, work and/or recreational activities without limitation due to current condition.    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 Continues to lack about 10 degrees extension.  Added hamstring stretch to HEP to assist with improvements in this area. Patient demonstrated good competency on ladder and was able to complete other stepping power exercises with some mild strength, power, balance, and endurance deficits and pain.  Decreased swelling noted since last session. Patient reported 1/10 pain by end of session that he felt was very tolerable. Advised to ice when he gets home after some errands he had planned. Pt required multimodal cuing for proper technique and to facilitate improved neuromuscular control, strength, range of motion, and functional ability resulting in improved performance and form. He was mildly spontaneous and eager to participate and help clean up and set up activities. Patient would benefit from continued physical therapy to address remaining impairments and functional limitations to work towards stated goals and return to PLOF or maximal functional independence.    Personal Factors and Comorbidities  Age;Comorbidity 3+;Time since onset of injury/illness/exacerbation    Comorbidities  history of chronic back pain since he was 10 when he was crushed between concrete and a log (deneis paresthesia or leg symptoms), GERD (doing well currently), Hx of inguinal hernia (surgery about 6 months ago, no official restrictions but can feel discomfort there some doing SLR), Hx of R TKA in 2017 with full and satisfactory recovery.    Examination-Activity Limitations  Squat;Lift;Stairs;Bend;Stand;Transfers;Sleep   building houses, climbing ladders, volunteer work, working in [employer], prolonged sitting and standing, walking long distances, transferring   Training and development officer;Yard Work;Community Activity;Driving;Interpersonal Relationship;Other   building houses, climbing ladders, volunteer work, working in the shop, prolonged sitting and standing, walking long distances, transferring, climbing ladders, walking on the roof   Stability/Clinical Decision Making  Stable/Uncomplicated    Rehab Potential  Good    Clinical Impairments Affecting Rehab Potential  (+) Very motivated, (-) h/o chronic low back pain    PT Frequency  2x / week    PT Duration  6 weeks    PT Treatment/Interventions   ADLs/Self Care Home Management;Cryotherapy;Electrical Stimulation;Moist Heat;Iontophoresis 4mg /ml Dexamethasone;Ultrasound;Contrast Bath;DME Instruction;Gait training;Stair training;Functional mobility training;Therapeutic activities;Therapeutic exercise;Balance training;Neuromuscular re-education;Patient/family education;Manual techniques;Scar mobilization;Passive range of motion;Compression bandaging;Dry needling;Energy conservation;Taping    PT Next Visit Plan  manual stretching and mobility work, strengthening, balance    PT Home Exercise Plan  Medbridge Access Code: [email or web address]    Consulted and Agree with Plan of Care  Patient       Patient will benefit from skilled therapeutic intervention in order to improve the following deficits and impairments:  Abnormal gait, Decreased activity tolerance, Decreased balance, Decreased endurance, Decreased range of motion, Decreased scar mobility, Decreased strength, Difficulty walking, Increased edema, Increased fascial restricitons, Increased muscle spasms, Impaired perceived functional ability, Impaired flexibility, Improper body mechanics, Postural dysfunction, Pain, Decreased skin integrity, Hypomobility  Visit Diagnosis: 1. Left knee pain, unspecified chronicity   2. Stiffness of left knee, not elsewhere classified   3. Difficulty in walking, not elsewhere classified   4. Muscle weakness (generalized)        Problem List Patient Active Problem List   Diagnosis Date Noted  . Non-recurrent unilateral inguinal hernia without obstruction or gangrene 08/29/2018  . OA (osteoarthritis) of knee 05/23/2016  . Hearing loss 01/06/2015  . Chronic back pain 12/09/2014  . GERD (gastroesophageal reflux disease) 12/09/2014  . Obesity 12/09/2014  . PSA Elevation 12/09/2014  . Rosacea 12/09/2014  . Family history of GI tract cancer 07/09/2008  . Lumbago 06/25/2007  . Diverticulosis of colon without hemorrhage 09/08/2006    Everlean Alstrom. Graylon Good, PT,  DPT  02/18/19, 2:31 PM  [facility] [address] [place], [address] Phone: [phone]   Fax:  [phone]  Name: Alexander Moss MRN: [id number] Date of Birth: [birth year]

## 2019-02-20 ENCOUNTER — Other Ambulatory Visit: Payer: Self-pay

## 2019-02-20 ENCOUNTER — Ambulatory Visit: Payer: PPO | Admitting: Physical Therapy

## 2019-02-20 DIAGNOSIS — R262 Difficulty in walking, not elsewhere classified: Secondary | ICD-10-CM

## 2019-02-20 DIAGNOSIS — M25662 Stiffness of left knee, not elsewhere classified: Secondary | ICD-10-CM

## 2019-02-20 DIAGNOSIS — M25562 Pain in left knee: Secondary | ICD-10-CM

## 2019-02-20 DIAGNOSIS — M6281 Muscle weakness (generalized): Secondary | ICD-10-CM

## 2019-02-20 NOTE — Therapy (Signed)
[facility] [address], [place], [address] Phone: [phone]   Fax:  [phone]  Physical Therapy Treatment / Discharge Summary Reporting period: 02/07/2019 - 02/20/2019  Patient Details  Name: Alexander Moss MRN: [id number] Date of Birth: 08/15/1945 Referring Provider (PT): Dione Plover. Aluisio   Encounter Date: 02/20/2019  PT End of Session - 02/21/19 0934    Visit Number  4    Number of Visits  12    Date for PT Re-Evaluation  03/21/19    Authorization Type  [employer] reporting period from 02/07/2019    Authorization Time Period  Current cert period 3/79/4327 - 03/21/2019 (latest PN: IE 02/07/2019)    Authorization - Visit Number  4    Authorization - Number of Visits  10    PT Start Time  1120    PT Stop Time  1159    PT Time Calculation (min)  39 min    Activity Tolerance  Patient tolerated treatment well    Behavior During Therapy  WFL for tasks assessed/performed       Past Medical History:  Diagnosis Date  . Arthritis   . GERD (gastroesophageal reflux disease)   . Hyperlipidemia   . Left inguinal hernia 08/2018    Past Surgical History:  Procedure Laterality Date  . [place]  . INGUINAL HERNIA REPAIR Left 09/07/2018   Procedure: OPEN LEFT HERNIA REPAIR - INGUINAL WITH MESH;  Surgeon: Fredirick Maudlin, MD;  Location: [facility] ORS;  Service: General;  Laterality: Left;  . JOINT REPLACEMENT Right 2017   Knee  . KNEE SURGERY Bilateral 2003  . TONSILLECTOMY AND ADENOIDECTOMY    . TOTAL KNEE ARTHROPLASTY Right 05/23/2016   Procedure: RIGHT TOTAL KNEE ARTHROPLASTY;  Surgeon: Gaynelle Arabian, MD;  Location: [facility];  Service: Orthopedics;  Laterality: Right;  . TOTAL KNEE ARTHROPLASTY Left 01/07/2019   Procedure: TOTAL KNEE ARTHROPLASTY;  Surgeon: Gaynelle Arabian, MD;  Location: [facility];  Service: Orthopedics;  Laterality: Left;  88mn    There were no vitals filed for this  visit.  Subjective Assessment - 02/20/19 1127    Subjective  Patient reports he feels some tightness upon arrival after his wife pushed down hard to stretch him out into extension, rates 1/10. States he feels comfortable with discharge today as reccomended by referring physician. Notes he feels the bike helped him a lot at his last visit and is eager to do it again today. Spent an hour push-mowing up and down hills yesterday. Continues to have mild but not concerning pain. He states going up and down step was easer after using the bike.    Pertinent History  Patient is a 73y.o. male who presents to outpatient physical therapy with a referral for medical diagnosis s/p L TKR performed on 01/07/2019 following gradual decrease in knee function and comfort due to advancing osteoarthritis. 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 Relevant past medical history and comorbidities include history of chronic back pain since he was 10 when he was crushed between concrete and a log (deneis paresthesia or leg symptoms), GERD (doing  well currently), Hx of inguinal hernia (surgery about 6 months ago, no official restrictions but can feel discomfort there some doing SLR), Hx of R TKA in 2017 with full and satisfactory recovery.    Limitations  Standing;Walking;House hold activities;Lifting;Sitting;Other (comment)   going up and down ladders, pushing lawn mower up hill, yardwork, volunteering building houses   How long can you sit comfortably?  no limitations    How long can you stand comfortably?  1 hour but would  not be comfortable. maybe 10-15 min comfortable.    How long can you walk comfortably?  10 minutes    Diagnostic tests  see chart    Patient Stated Goals  To be able to climb a ladder to a roof so he can continue volunteering with [employer]. be able to walk a mile.    Currently in Pain?  Yes    Pain Score  1     Pain Location  Knee    Pain Orientation  Left;Posterior    Pain Descriptors / Indicators  Aching    Pain Type  Surgical pain    Pain Onset  More than a month ago    Aggravating Factors   sitting a long time (when he gets up), riding in a car with knee partly extended, walking a lot, steps without a hand rail    Pain Relieving Factors  rest, ice, movement in general    Effect of Pain on Daily Activities  Has not yet returned to volunteering at [employer]. Has returned to mowing with push mower, using brush hog.        [facility] PT Assessment - 02/21/19 0001      Assessment   Medical Diagnosis  L TKA 01/07/2019    Referring Provider (PT)  Dione Plover. Aluisio    Onset Date/Surgical Date  01/07/19    Hand Dominance  Right    Next MD Visit  02/12/2019    Prior Therapy  none for this knee prior to current episode of care      Precautions   Precautions  Fall    Precaution Comments  don't over do it      Restrictions   Weight Bearing Restrictions  No      Balance Screen   Has the patient fallen in the past 6 months  No    Has the patient had a decrease in activity level because of a fear of falling?   No    Is the patient reluctant to leave their home because of a fear of falling?   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    Leisure  Volunteering with [employer], working in the shop, building boats, bush hog and  mow grass, cut down trees and cut for firewood, go fishing      Cognition   Overall Cognitive Status  Within Functional Limits for tasks assessed      Observation/Other Assessments   Observations  See note from 02/20/2019 for latest objective data      6 minute walk test results    Aerobic Endurance Distance Walked  1478   no AD, no rests   Endurance additional comments  --       OBJECTIVE: OBSERVATION/INSPECTION: Patient presents with increased edema in entire L LE compared to R that pt states was not present prior to surgery (significantly improved since initial evaluation), incision appears to be healing well with scabbing nearly resolved. No excessive redness, heat or swelling suggesting infection. Scar is stiff and adhered to underlying tissues in several places. Patient stands with stooped posture, flexed at the hips and lacks lumbar lordosis. Weight shifted away from L LE.   PERIPHERAL JOINT MOTION (AROM/PROM in degrees):  *Indicates pain Knee  Flexion: R = 125/130 cramping, L = 120/120 painful (soft end feel); previous session able to reach 125 PROM with firmer end feel.  Extension: R = 0/0, L = -6/-6 (firm end feel)  STRENGTH:  *Indicates pain  Grossly WFL for basic mobility  PALPATION:  TTP over incision   FUNCTIONAL MOBILITY:  Bed mobility: independent with supine to sit and rolling.   Transfers: sit <> stand   Gait: stooped posture, left toe out, lack of L knee extension at heel strike, slight lateral sway during 6MWT. Antalgic gait when knee tender but able to mostly  correct when cued.  Stairs: step over  step with no UE   FUNCTIONAL/BALANCE TESTS: - Single leg stance, firm surface, eyes open: R= 7 seconds, L= 2 seconds - Six Minute Walk Test: 1,478 feet, no AD, no rests  EDUCATION/COGNITION: Patient is alert and oriented X 4.  Objective measurements completed on examination: See above findings.      TREATMENT:  Therapeutic exercise:to centralize symptoms and improve ROM, strength, muscular endurance, and activity tolerance required for successful completion of functional activities. -Recumbent Bikeno resistance.. For improved lower extremity ROM, muscular endurance, and activity tolerance; and to induce the analgesic effect of aerobic exercise, stimulate joint nutrition, and prepare body structures and systems for following interventions. x2 Minutes each with seat at level 8, 7, and 6 to allow free rotation then improved stretch. Patient sore following but demoL knee flexion AAROM to 120 degrees. Patient reports knee feels stiffer today compared to two days ago.  - walking around clinic between activities for an active rest and to allow discomfort to dissipate prior to next exercise.  - 6 minute walk test to assess progress (see above) - objective testing to assess progress (see above).  - step up to 6 inch step x 10 each side without UE  - Education on HEP including handout  - Education on diagnosis, prognosis, POC, anatomy and physiology of current condition.   Manual therapy:to reduce pain and tissue tension, improve range of motion, neuromodulation, in order to promote improved ability to complete functional activities. - STM for improved lymph and venous return along L thigh, around patella and knee joint, lower leg.  - PROM with overpressure as tolerated in flexion (very sensitive) and extension prior to PROM measurements.  - mild scar massage to improve skin glide over underlying structures.   HOME EXERCISE PROGRAM Access Code: [email or web address]  URL:  [email or web address]  Date: 02/20/2019  Prepared by:  Rosita Kea   Exercises  Supine Heel Slide with Strap - 1-3 sets - 10-20 reps - 5 seconds hold - 1-2x daily - 7x weekly  Seated Quad Set - 1-3 sets - 10-20 reps - 5 seconds hold - 1-2x daily - 7x weekly  Hamstring stretch (with strap) - 3 reps - 30-60 seconds hold - 2x daily - 7x weekly  Standing Tandem Balance with Counter Support - 3 reps - 15-30 seconds hold - 1-2x daily - 7x weekly  Step Up - 3 sets - 10 reps - 1x daily - 7x weekly  Patient Education  Scar Massage   PT Education - 02/21/19 0933    Education provided  Yes    Education Details  Exercise purpose/form. Self management techniques. Updated HEP    Person(s) Educated  Patient    Methods  Explanation;Demonstration    Comprehension  Verbalized understanding;Returned demonstration       PT Short Term Goals - 02/21/19 0935      PT SHORT TERM GOAL #1   Title  Be independent with initial home exercise program for self-management of symptoms.    Baseline  initial HEP provided at IE (02/07/2019);    Time  2    Period  Weeks    Status  Achieved    Target Date  02/21/19        PT Long Term Goals - 02/21/19 0951      PT LONG TERM GOAL #1   Title  Be independent with a long-term home exercise program for self-management of symptoms.    Baseline  initial HEP provided at IE (02/07/2019);    Time  6    Period  Weeks    Status  Achieved    Target Date  03/21/19      PT LONG TERM GOAL #2   Title  Improve L knee  PROM to 0-125 to allow patient to complete valued activities with less difficulty.    Baseline  10-0-115 with pain (02/07/2019); 6-0-120 (last visit 10-0-125, 02/20/2019);    Time  6    Period  Weeks    Status  Partially Met    Target Date  03/21/19      PT LONG TERM GOAL #3   Title  Patient will improve 6MWT to equal or greater than 1730 feet to reach norm for his age group based on research to demonstrate improved gait speed and functional  endurance for community mobility.    Baseline  1155 feet, no AD, no rests (02/07/2019); 1,478 feet, no AD, no rests (02/20/2019);    Time  6    Period  Weeks    Status  Partially Met    Target Date  03/21/19      PT LONG TERM GOAL #4   Title  Patient will be able to balance on single leg stance for equal or greater than 30 seconds each leg in order to improve balance for functional activities that require single leg stance such as walking.    Baseline  Single leg stance, firm surface, eyes open: R= 9 seconds, L= 14 seconds (02/07/2019); Single leg stance, firm surface, eyes open: R= 7 seconds, L= 2 seconds (02/20/2019);    Time  6    Period  Weeks    Status  Not Met    Target Date  03/21/19      PT LONG TERM GOAL #5   Title  Complete community, work and/or recreational activities without limitation due to current  condition.    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 Has not returned to [employer] yet (02/20/2019);    Time  6    Period  Weeks    Status  Partially Met    Target Date  03/21/19            Plan - 02/21/19 0956    Clinical Impression Statement  Patient has attended 4 skilled physical therapy treatment sessions over 2 weeks this episode of care and made good progress towards goals in this short period of time. He has received a long term HEP to continue his progress independently. Patient made progress towards all goals, except for static single leg balance. He demonstrated ability to climb and descend ladder with BUE support and has improved ROM, although he continues to lack terminal knee extension and flexion is greatly affected  by pain and swelling varying between 120 and 125 PROM as measured over the last two treatment sessions. Patient is now discharging from physical therapy due to physician recommendation and patient preference.    Personal Factors and Comorbidities  Age;Comorbidity 3+;Time since onset of injury/illness/exacerbation    Comorbidities  history of chronic back pain since he was 10 when he was crushed between concrete and a log (deneis paresthesia or leg symptoms), GERD (doing well currently), Hx of inguinal hernia (surgery about 6 months ago, no official restrictions but can feel discomfort there some doing SLR), Hx of R TKA in 2017 with full and satisfactory recovery.    Examination-Activity Limitations  Squat;Lift;Stairs;Bend;Stand;Transfers;Sleep   building houses, climbing ladders, volunteer work, working in [employer], prolonged sitting and standing, walking long distances, transferring   Training and development officer;Yard Work;Community Activity;Driving;Interpersonal Relationship;Other   building houses, climbing ladders, volunteer work, working in the shop, prolonged sitting and standing, walking long distances, transferring, climbing ladders, walking on the roof   Stability/Clinical Decision Making  Stable/Uncomplicated    Rehab Potential  Good    Clinical Impairments Affecting Rehab Potential  (+) Very motivated, (-) h/o chronic low back pain    PT Frequency  2x / week    PT Duration  6 weeks    PT Treatment/Interventions  ADLs/Self Care Home Management;Cryotherapy;Electrical Stimulation;Moist Heat;Iontophoresis '4mg'$ /ml Dexamethasone;Ultrasound;Contrast Bath;DME Instruction;Gait training;Stair training;Functional mobility training;Therapeutic activities;Therapeutic exercise;Balance training;Neuromuscular re-education;Patient/family education;Manual techniques;Scar mobilization;Passive range of motion;Compression bandaging;Dry needling;Energy conservation;Taping    PT Next Visit Plan   Patient is now discharged from physical therapy due to physician recommendation and patient preference.    PT Home Exercise Plan  Medbridge Access Code: [email or web address]    Consulted and Agree with Plan of Care  Patient       Patient will benefit from skilled therapeutic intervention in order to improve the following deficits and impairments:  Abnormal gait, Decreased activity tolerance, Decreased balance, Decreased endurance, Decreased range of motion, Decreased scar mobility, Decreased strength, Difficulty walking, Increased edema, Increased fascial restricitons, Increased muscle spasms, Impaired perceived functional ability, Impaired flexibility, Improper body mechanics, Postural dysfunction, Pain, Decreased skin integrity, Hypomobility  Visit Diagnosis: 1. Left knee pain, unspecified chronicity   2. Stiffness of left knee, not elsewhere classified   3. Difficulty in walking,  not elsewhere classified   4. Muscle weakness (generalized)        Problem List Patient Active Problem List   Diagnosis Date Noted  . Non-recurrent unilateral inguinal hernia without obstruction or gangrene 08/29/2018  . OA (osteoarthritis) of knee 05/23/2016  . Hearing loss 01/06/2015  . Chronic back pain 12/09/2014  . GERD (gastroesophageal reflux disease) 12/09/2014  . Obesity 12/09/2014  . PSA Elevation 12/09/2014  . Rosacea 12/09/2014  . Family history of GI tract cancer 07/09/2008  . Lumbago 06/25/2007  . Diverticulosis of colon without hemorrhage 09/08/2006    Everlean Alstrom. Graylon Good, PT, DPT 02/21/19, 9:58 AM  [facility] [address] [place], [address] Phone: [phone]   Fax:  [phone]  Name: Alexander Moss MRN: [id number] Date of Birth: 01/17/1946

## 2019-02-21 ENCOUNTER — Encounter: Payer: Self-pay | Admitting: Physical Therapy

## 2019-02-21 ENCOUNTER — Encounter: Payer: PPO | Admitting: Physical Therapy

## 2019-02-25 ENCOUNTER — Encounter: Payer: PPO | Admitting: Physical Therapy

## 2019-02-26 ENCOUNTER — Other Ambulatory Visit: Payer: Self-pay

## 2019-02-26 ENCOUNTER — Encounter
Admission: RE | Admit: 2019-02-26 | Discharge: 2019-02-26 | Disposition: A | Discharge status: Home / Self Care | Payer: PPO | Source: Ambulatory Visit | Attending: Urology | Admitting: Urology

## 2019-02-26 NOTE — H&P (Signed)
NAME: Alexander Moss, Alexander Moss. MEDICAL RECORD [id number] ACCOUNT [id number] DATE OF BIRTH:[birth year] FACILITY: [facility] LOCATION:  PHYSICIAN:Lisha Vitale Farrel Conners, MD  HISTORY AND PHYSICAL  DATE OF ADMISSION:  03/05/2019  CHIEF COMPLAINT:  Urinary retention.  HISTORY OF PRESENT ILLNESS:  The patient is a 73 year old white male who developed acute urinary retention following left knee surgery 2 months ago.  He was found to have a large postvoid residual in the office and has been on intermittent self-cath  since then.  This is in spite of treatment with dutasteride and tamsulosin.  Evaluation in the office included cystoscopy 08/05 which indicated a 3 cm obstructing prostatic urethral length.  Prostate ultrasound indicated a 62.3 g prostate with median  lobe enlargement.  PSA level was 5.4.  The patient comes in now for photovaporization of the prostate with a GreenLight laser.  ALLERGIES:  No drug allergies.  CURRENT MEDICATIONS:  Aspirin, gabapentin, tamsulosin, dutasteride, calcium, Allegra, fish oil, vitamin B12, vitamin E, and zinc.  PAST SURGICAL HISTORY:  Hemorrhoidectomy in 1970, right knee replacement in 2017, left inguinal herniorrhaphy 2019, left knee replacement in July 2020.  PAST AND CURRENT MEDICAL CONDITIONS: 1.  Degenerative joint disease. 2.  Seasonal allergies.  REVIEW OF SYSTEMS:  The patient has decreased auditory acuity.  Denies chest pain, shortness of breath, diabetes, stroke, or hypertension.  SOCIAL HISTORY:  The patient quit smoking in 1981 with a 15-pack-year history.  He consumes 1 to 2 alcoholic beverages per week.  FAMILY HISTORY:  Father died at age 73 of unknown cancer variety.  Mother died at age 92 of a stroke.  PHYSICAL EXAMINATION: VITAL SIGNS:  Height 5 feet 7 inches, weight 222 pounds, BMI 34. GENERAL:  Chronically ill-appearing white male in no acute distress. HEENT:  Sclerae were clear. NECK:  No palpable cervical adenopathy.  No audible carotid  bruits. PULMONARY:  Lungs clear to auscultation. CARDIOVASCULAR:  Regular rhythm and rate without audible murmurs. ABDOMEN:  Soft, nontender abdomen. GENITOURINARY:  Circumcised, testes smooth, nontender, but atrophic. RECTAL:  Greater than 40 g, smooth, nontender prostate. NEUROMUSCULAR:  Alert and oriented x3.  IMPRESSION: 1.  Urinary retention. 2.  Benign prostatic hypertrophy with bladder outlet obstruction. 3.  Elevated PSA due to significant benign prostatic hypertrophy.  PLAN:  Photovaporization of prostate with a GreenLight laser.  LN/NUANCE  D:02/25/2019 T:02/25/2019 JOB:[id number]

## 2019-02-26 NOTE — Patient Instructions (Signed)
Your procedure is scheduled on: 03-05-19 TUESDAY Report to Same Day Surgery 2nd floor medical mall [facility] Entrance-take elevator on left to 2nd floor.  Check in with surgery information desk.) To find out your arrival time please call [phone] between 1PM - 3PM on 03-04-19 MONDAY  Remember: Instructions that are not followed completely may result in serious medical risk, up to and including death, or upon the discretion of your surgeon and anesthesiologist your surgery may need to be rescheduled.    _x___ 1. Do not eat food after midnight the night before your procedure. NO GUM OR CANDY AFTER MIDNIGHT. You may drink clear liquids up to 2 hours before you are scheduled to arrive at the hospital for your procedure.  Do not drink clear liquids within 2 hours of your scheduled arrival to the hospital.  Clear liquids include  --Water or Apple juice without pulp  --Clear carbohydrate beverage such as ClearFast or Gatorade  --Black Coffee or Clear Tea (No milk, no creamers, do not add anything to the coffee or Tea   ____Ensure clear carbohydrate drink on the way to the hospital for bariatric patients  ____Ensure clear carbohydrate drink 3 hours before surgery.     __x__ 2. No Alcohol for 24 hours before or after surgery.   __x__3. No Smoking or e-cigarettes for 24 prior to surgery.  Do not use any chewable tobacco products for at least 6 hour prior to surgery   ____  4. Bring all medications with you on the day of surgery if instructed.    __x__ 5. Notify your doctor if there is any change in your medical condition     (cold, fever, infections).    x___6. On the morning of surgery brush your teeth with toothpaste and water.  You may rinse your mouth with mouth wash if you wish.  Do not swallow any toothpaste or mouthwash.   Do not wear jewelry, make-up, hairpins, clips or nail polish.  Do not wear lotions, powders, or perfumes. You may wear deodorant.  Do not shave 48 hours prior  to surgery. Men may shave face and neck.  Do not bring valuables to the hospital.    [facility] is not responsible for any belongings or valuables.               Contacts, dentures or bridgework may not be worn into surgery.  Leave your suitcase in the car. After surgery it may be brought to your room.  For patients admitted to the hospital, discharge time is determined by your treatment team.  _  Patients discharged the day of surgery will not be allowed to drive home.  You will need someone to drive you home and stay with you the night of your procedure.    Please read over the following fact sheets that you were given:   [facility] Preparing for Surgery   _x___ TAKE THE FOLLOWING MEDICATION THE MORNING OF SURGERY WITH A SMALL SIP OF WATER. These include:  1. AVODART (DUTASERIDE)  2. FLOMAX (TAMSULOSIN)  3. ALLEGRA (FEXOFENADINE)    ____Fleets enema or Magnesium Citrate as directed.   ____ Use CHG Soap or sage wipes as directed on instruction sheet   ____ Use inhalers on the day of surgery and bring to hospital day of surgery  ____ Stop Metformin and Janumet 2 days prior to surgery.    ____ Take 1/2 of usual insulin dose the night before surgery and none on the morning surgery.  ____ Follow recommendations from Cardiologist, Pulmonologist or PCP regarding stopping Aspirin, Coumadin, Plavix ,Eliquis, Effient, or Pradaxa, and Pletal.  X____Stop Anti-inflammatories such as Advil, Aleve, Ibuprofen, Motrin, Naproxen, Naprosyn, Goodies powders or aspirin products NOW-OK to take Tylenol    _x___ Stop supplements until after surgery-STOP VITAMIN E AND FISH OIL NOW-MAY RESUME AFTER SURGERY   ____ Bring C-Pap to the hospital.

## 2019-02-27 ENCOUNTER — Encounter: Payer: PPO | Admitting: Physical Therapy

## 2019-02-28 ENCOUNTER — Encounter: Payer: PPO | Admitting: Physical Therapy

## 2019-03-01 ENCOUNTER — Other Ambulatory Visit: Payer: Self-pay

## 2019-03-01 ENCOUNTER — Other Ambulatory Visit
Admission: RE | Admit: 2019-03-01 | Discharge: 2019-03-01 | Disposition: A | Discharge status: Home / Self Care | Payer: PPO | Source: Ambulatory Visit | Attending: Urology | Admitting: Urology

## 2019-03-01 DIAGNOSIS — Z01812 Encounter for preprocedural laboratory examination: Secondary | ICD-10-CM | POA: Diagnosis not present

## 2019-03-01 DIAGNOSIS — Z20828 Contact with and (suspected) exposure to other viral communicable diseases: Secondary | ICD-10-CM | POA: Diagnosis not present

## 2019-03-01 LAB — SARS CORONAVIRUS 2 (TAT 6-24 HRS): SARS Coronavirus 2: NEGATIVE

## 2019-03-04 ENCOUNTER — Encounter: Payer: PPO | Admitting: Physical Therapy

## 2019-03-05 ENCOUNTER — Encounter
Admission: RE | Disposition: A | Discharge status: Home / Self Care | Payer: Self-pay | Source: Ambulatory Visit | Attending: Urology

## 2019-03-05 ENCOUNTER — Ambulatory Visit: Payer: PPO | Admitting: Anesthesiology

## 2019-03-05 ENCOUNTER — Ambulatory Visit
Admission: RE | Admit: 2019-03-05 | Discharge: 2019-03-05 | Disposition: A | Discharge status: Home / Self Care | Payer: PPO | Source: Ambulatory Visit | Attending: Urology | Admitting: Urology

## 2019-03-05 ENCOUNTER — Encounter: Payer: Self-pay | Admitting: *Deleted

## 2019-03-05 DIAGNOSIS — Z79899 Other long term (current) drug therapy: Secondary | ICD-10-CM | POA: Insufficient documentation

## 2019-03-05 DIAGNOSIS — N320 Bladder-neck obstruction: Secondary | ICD-10-CM | POA: Insufficient documentation

## 2019-03-05 DIAGNOSIS — N138 Other obstructive and reflux uropathy: Secondary | ICD-10-CM

## 2019-03-05 DIAGNOSIS — Z87891 Personal history of nicotine dependence: Secondary | ICD-10-CM | POA: Diagnosis not present

## 2019-03-05 DIAGNOSIS — Z6834 Body mass index (BMI) 34.0-34.9, adult: Secondary | ICD-10-CM | POA: Insufficient documentation

## 2019-03-05 DIAGNOSIS — E669 Obesity, unspecified: Secondary | ICD-10-CM | POA: Diagnosis not present

## 2019-03-05 DIAGNOSIS — R39198 Other difficulties with micturition: Secondary | ICD-10-CM | POA: Insufficient documentation

## 2019-03-05 DIAGNOSIS — R338 Other retention of urine: Secondary | ICD-10-CM | POA: Diagnosis not present

## 2019-03-05 DIAGNOSIS — E785 Hyperlipidemia, unspecified: Secondary | ICD-10-CM | POA: Insufficient documentation

## 2019-03-05 DIAGNOSIS — K219 Gastro-esophageal reflux disease without esophagitis: Secondary | ICD-10-CM | POA: Insufficient documentation

## 2019-03-05 DIAGNOSIS — Z96653 Presence of artificial knee joint, bilateral: Secondary | ICD-10-CM | POA: Insufficient documentation

## 2019-03-05 DIAGNOSIS — R339 Retention of urine, unspecified: Secondary | ICD-10-CM | POA: Diagnosis not present

## 2019-03-05 DIAGNOSIS — Z7982 Long term (current) use of aspirin: Secondary | ICD-10-CM | POA: Insufficient documentation

## 2019-03-05 DIAGNOSIS — N401 Enlarged prostate with lower urinary tract symptoms: Secondary | ICD-10-CM | POA: Diagnosis not present

## 2019-03-05 DIAGNOSIS — N400 Enlarged prostate without lower urinary tract symptoms: Secondary | ICD-10-CM | POA: Diagnosis not present

## 2019-03-05 HISTORY — PX: GREEN LIGHT LASER TURP (TRANSURETHRAL RESECTION OF PROSTATE: SHX6260

## 2019-03-05 SURGERY — GREEN LIGHT LASER TURP (TRANSURETHRAL RESECTION OF PROSTATE
Anesthesia: General

## 2019-03-05 MED ORDER — BELLADONNA ALKALOIDS-OPIUM 16.2-60 MG RE SUPP
RECTAL | Status: DC | PRN
  Administered 2019-03-05: 1 via RECTAL

## 2019-03-05 MED ORDER — URIBEL 118 MG PO CAPS: 1.0000 | ORAL_CAPSULE | Freq: Four times a day (QID) | ORAL | 3 refills | Status: DC | PRN

## 2019-03-05 MED ORDER — OXYCODONE HCL 5 MG PO TABS: 5.0000 mg | ORAL_TABLET | Freq: Once | ORAL | Status: DC | PRN

## 2019-03-05 MED ORDER — SUCCINYLCHOLINE CHLORIDE 20 MG/ML IJ SOLN
INTRAMUSCULAR | Status: AC
  Filled 2019-03-05: qty 1

## 2019-03-05 MED ORDER — FENTANYL CITRATE (PF) 100 MCG/2ML IJ SOLN
INTRAMUSCULAR | Status: AC
  Filled 2019-03-05: qty 2

## 2019-03-05 MED ORDER — LIDOCAINE HCL URETHRAL/MUCOSAL 2 % EX GEL
CUTANEOUS | Status: DC | PRN
  Administered 2019-03-05: 1

## 2019-03-05 MED ORDER — PROPOFOL 10 MG/ML IV BOLUS
INTRAVENOUS | Status: DC | PRN
  Administered 2019-03-05: 160 mg via INTRAVENOUS

## 2019-03-05 MED ORDER — SUCCINYLCHOLINE CHLORIDE 20 MG/ML IJ SOLN
INTRAMUSCULAR | Status: DC | PRN
  Administered 2019-03-05: 120 mg via INTRAVENOUS

## 2019-03-05 MED ORDER — LACTATED RINGERS IV SOLN
INTRAVENOUS | Status: DC
  Administered 2019-03-05 (×2): via INTRAVENOUS

## 2019-03-05 MED ORDER — DEXAMETHASONE SODIUM PHOSPHATE 10 MG/ML IJ SOLN
INTRAMUSCULAR | Status: AC
  Filled 2019-03-05: qty 1

## 2019-03-05 MED ORDER — MEPERIDINE HCL 50 MG/ML IJ SOLN: 6.2500 mg | INTRAMUSCULAR | Status: DC | PRN

## 2019-03-05 MED ORDER — FENTANYL CITRATE (PF) 100 MCG/2ML IJ SOLN
INTRAMUSCULAR | Status: DC | PRN
  Administered 2019-03-05 (×2): 50 ug via INTRAVENOUS

## 2019-03-05 MED ORDER — SUGAMMADEX SODIUM 200 MG/2ML IV SOLN
INTRAVENOUS | Status: DC | PRN
  Administered 2019-03-05: 200 mg via INTRAVENOUS

## 2019-03-05 MED ORDER — PHENYLEPHRINE HCL (PRESSORS) 10 MG/ML IV SOLN
INTRAVENOUS | Status: DC | PRN
  Administered 2019-03-05: 100 ug via INTRAVENOUS

## 2019-03-05 MED ORDER — CIPROFLOXACIN HCL 500 MG PO TABS: 500.0000 mg | ORAL_TABLET | Freq: Two times a day (BID) | ORAL | 0 refills | Status: DC

## 2019-03-05 MED ORDER — CEFAZOLIN SODIUM-DEXTROSE 1-4 GM/50ML-% IV SOLN
1.0000 g | Freq: Once | INTRAVENOUS | Status: AC
  Administered 2019-03-05: 1 g via INTRAVENOUS

## 2019-03-05 MED ORDER — FAMOTIDINE 20 MG PO TABS
20.0000 mg | ORAL_TABLET | Freq: Once | ORAL | Status: AC
  Administered 2019-03-05: 12:00:00 20 mg via ORAL

## 2019-03-05 MED ORDER — FENTANYL CITRATE (PF) 100 MCG/2ML IJ SOLN: 25.0000 ug | INTRAMUSCULAR | Status: DC | PRN

## 2019-03-05 MED ORDER — CEFAZOLIN SODIUM-DEXTROSE 1-4 GM/50ML-% IV SOLN
INTRAVENOUS | Status: AC
  Filled 2019-03-05: qty 50

## 2019-03-05 MED ORDER — DEXAMETHASONE SODIUM PHOSPHATE 10 MG/ML IJ SOLN
INTRAMUSCULAR | Status: DC | PRN
  Administered 2019-03-05: 5 mg via INTRAVENOUS

## 2019-03-05 MED ORDER — SEVOFLURANE IN SOLN
RESPIRATORY_TRACT | Status: AC
  Filled 2019-03-05: qty 250

## 2019-03-05 MED ORDER — PROMETHAZINE HCL 25 MG/ML IJ SOLN: 6.2500 mg | INTRAMUSCULAR | Status: DC | PRN

## 2019-03-05 MED ORDER — LIDOCAINE HCL URETHRAL/MUCOSAL 2 % EX GEL
CUTANEOUS | Status: AC
  Filled 2019-03-05: qty 10

## 2019-03-05 MED ORDER — ONDANSETRON HCL 4 MG/2ML IJ SOLN
INTRAMUSCULAR | Status: DC | PRN
  Administered 2019-03-05: 4 mg via INTRAVENOUS

## 2019-03-05 MED ORDER — PROPOFOL 10 MG/ML IV BOLUS
INTRAVENOUS | Status: AC
  Filled 2019-03-05: qty 20

## 2019-03-05 MED ORDER — SUGAMMADEX SODIUM 200 MG/2ML IV SOLN
INTRAVENOUS | Status: AC
  Filled 2019-03-05: qty 2

## 2019-03-05 MED ORDER — ONDANSETRON HCL 4 MG/2ML IJ SOLN: INTRAMUSCULAR | Status: DC | PRN

## 2019-03-05 MED ORDER — FAMOTIDINE 20 MG PO TABS
ORAL_TABLET | ORAL | Status: AC
  Administered 2019-03-05: 12:00:00 20 mg via ORAL
  Filled 2019-03-05: qty 1

## 2019-03-05 MED ORDER — DOCUSATE SODIUM 100 MG PO CAPS: 200.0000 mg | ORAL_CAPSULE | Freq: Two times a day (BID) | ORAL | 3 refills | Status: AC

## 2019-03-05 MED ORDER — OXYCODONE HCL 5 MG/5ML PO SOLN: 5.0000 mg | Freq: Once | ORAL | Status: DC | PRN

## 2019-03-05 MED ORDER — ROCURONIUM BROMIDE 100 MG/10ML IV SOLN
INTRAVENOUS | Status: DC | PRN
  Administered 2019-03-05: 10 mg via INTRAVENOUS
  Administered 2019-03-05: 25 mg via INTRAVENOUS
  Administered 2019-03-05: 5 mg via INTRAVENOUS
  Administered 2019-03-05: 10 mg via INTRAVENOUS

## 2019-03-05 MED ORDER — PHENYLEPHRINE HCL (PRESSORS) 10 MG/ML IV SOLN
INTRAVENOUS | Status: AC
  Filled 2019-03-05: qty 1

## 2019-03-05 MED ORDER — ROCURONIUM BROMIDE 50 MG/5ML IV SOLN
INTRAVENOUS | Status: AC
  Filled 2019-03-05: qty 1

## 2019-03-05 MED ORDER — BELLADONNA ALKALOIDS-OPIUM 16.2-60 MG RE SUPP
RECTAL | Status: AC
  Filled 2019-03-05: qty 1

## 2019-03-05 MED ORDER — LIDOCAINE HCL (PF) 2 % IJ SOLN
INTRAMUSCULAR | Status: AC
  Filled 2019-03-05: qty 10

## 2019-03-05 MED ORDER — MIDAZOLAM HCL 2 MG/2ML IJ SOLN
INTRAMUSCULAR | Status: AC
  Filled 2019-03-05: qty 2

## 2019-03-05 MED ORDER — LIDOCAINE HCL (CARDIAC) PF 100 MG/5ML IV SOSY
PREFILLED_SYRINGE | INTRAVENOUS | Status: DC | PRN
  Administered 2019-03-05: 60 mg via INTRAVENOUS

## 2019-03-05 MED ORDER — ONDANSETRON HCL 4 MG/2ML IJ SOLN
INTRAMUSCULAR | Status: AC
  Filled 2019-03-05: qty 2

## 2019-03-05 MED ORDER — HYOSCYAMINE SULFATE SL 0.125 MG SL SUBL: 0.1250 mg | SUBLINGUAL_TABLET | SUBLINGUAL | 3 refills | Status: DC | PRN

## 2019-03-05 SURGICAL SUPPLY — 23 items
ADAPTER IRRIG TUBE 2 SPIKE SOL (ADAPTER) ×4 IMPLANT
BAG URINE DRAINAGE (UROLOGICAL SUPPLIES) ×2 IMPLANT
CATH FOLEY 2WAY  5CC 20FR SIL (CATHETERS)
CATH FOLEY 2WAY 5CC 20FR SIL (CATHETERS) IMPLANT
GLOVE BIO SURGEON STRL SZ7.5 (GLOVE) ×2 IMPLANT
GOWN STRL REUS W/ TWL LRG LVL3 (GOWN DISPOSABLE) ×1 IMPLANT
GOWN STRL REUS W/ TWL XL LVL3 (GOWN DISPOSABLE) ×1 IMPLANT
GOWN STRL REUS W/TWL LRG LVL3 (GOWN DISPOSABLE) ×1
GOWN STRL REUS W/TWL XL LVL3 (GOWN DISPOSABLE) ×1
IV NS 1000ML (IV SOLUTION) ×1
IV NS 1000ML BAXH (IV SOLUTION) ×1 IMPLANT
IV SET PRIMARY 15D 139IN B9900 (IV SETS) ×2 IMPLANT
KIT TURNOVER CYSTO (KITS) ×2 IMPLANT
LASER GREENLIGHT XPS PROCEDURE (MISCELLANEOUS) ×1 IMPLANT
LASER GRNLGT MOXY FIBER 750UM (MISCELLANEOUS) ×1 IMPLANT
PACK CYSTO AR (MISCELLANEOUS) ×2 IMPLANT
SET IRRIG Y TYPE TUR BLADDER L (SET/KITS/TRAYS/PACK) ×2 IMPLANT
SOL .9 NS 3000ML IRR  AL (IV SOLUTION) ×6
SOL .9 NS 3000ML IRR UROMATIC (IV SOLUTION) ×4 IMPLANT
SURGILUBE 2OZ TUBE FLIPTOP (MISCELLANEOUS) ×2 IMPLANT
SYRINGE IRR TOOMEY STRL 70CC (SYRINGE) ×2 IMPLANT
WATER STERILE IRR 1000ML POUR (IV SOLUTION) ×2 IMPLANT
mOxy liquid cooled ×1 IMPLANT

## 2019-03-05 NOTE — Anesthesia Postprocedure Evaluation (Signed)
Anesthesia Post Note  Patient: Alexander Moss  Procedure(s) Performed: GREEN LIGHT LASER TURP (TRANSURETHRAL RESECTION OF PROSTATE (N/A )  Patient location during evaluation: PACU Anesthesia Type: General Level of consciousness: awake and alert Pain management: pain level controlled Vital Signs Assessment: post-procedure vital signs reviewed and stable Respiratory status: spontaneous breathing, nonlabored ventilation and respiratory function stable Cardiovascular status: blood pressure returned to baseline and stable Postop Assessment: no apparent nausea or vomiting Anesthetic complications: no     Last Vitals:  Vitals:   03/05/19 1533 03/05/19 1548  BP: (!) 142/83 139/79  Pulse: 76 74  Resp: 16 (!) 21  Temp: 36.4 C   SpO2: 99% 96%    Last Pain:  Vitals:   03/05/19 1548  TempSrc:   PainSc: 0-No pain                 Durenda Hurt

## 2019-03-05 NOTE — H&P (Signed)
Date of Initial H&P: 02/25/19  History reviewed, patient examined, no change in status, stable for surgery.

## 2019-03-05 NOTE — Transfer of Care (Signed)
Immediate Anesthesia Transfer of Care Note  Patient: Alexander Moss  Procedure(s) Performed: GREEN LIGHT LASER TURP (TRANSURETHRAL RESECTION OF PROSTATE (N/A )  Patient Location: PACU  Anesthesia Type:General  Level of Consciousness: sedated  Airway & Oxygen Therapy: Patient Spontanous Breathing and Patient connected to face mask oxygen  Post-op Assessment: Report given to RN and Post -op Vital signs reviewed and stable  Post vital signs: Reviewed and stable  Last Vitals:  Vitals Value Taken Time  BP 142/83   Temp    Pulse 76 03/05/19 1533  Resp 15 03/05/19 1533  SpO2 98 % 03/05/19 1533  Vitals shown include unvalidated device data.  Last Pain:  Vitals:   03/05/19 1157  TempSrc: Tympanic  PainSc: 0-No pain         Complications: No apparent anesthesia complications

## 2019-03-05 NOTE — Anesthesia Procedure Notes (Signed)
Procedure Name: Intubation Date/Time: 03/05/2019 1:13 PM Performed by: Dionne Bucy, CRNA Pre-anesthesia Checklist: Patient identified, Patient being monitored, Timeout performed, Emergency Drugs available and Suction available Patient Re-evaluated:Patient Re-evaluated prior to induction Oxygen Delivery Method: Circle system utilized Preoxygenation: Pre-oxygenation with 100% oxygen Induction Type: IV induction Ventilation: Mask ventilation without difficulty Laryngoscope Size: Mac and 4 Grade View: Grade II Tube type: Oral Tube size: 7.5 mm Number of attempts: 1 Airway Equipment and Method: Stylet and Video-laryngoscopy Placement Confirmation: ETT inserted through vocal cords under direct vision,  positive ETCO2 and breath sounds checked- equal and bilateral Secured at: 23 cm Tube secured with: Tape Dental Injury: Teeth and Oropharynx as per pre-operative assessment

## 2019-03-05 NOTE — Op Note (Signed)
Preoperative diagnosis: 1.  Acute urinary retention                                             2.  BPH with bladder outlet obstruction  Postoperative diagnosis: Same  Procedure: Photo vaporization of the prostate with greenlight laser  Surgeon: Otelia Limes. Yves Dill MD  Anesthesia: General  Indications:See the history and physical. After informed consent the above procedure(s) were requested     Technique and findings: After adequate general anesthesia had been obtained the patient was placed into dorsal lithotomy position and the perineum was prepped and draped in the usual fashion.  The laser scope was coupled to the camera and visually advanced into the bladder.  Trilobar BPH was present with median lobe intravesical growth.  The bladder was heavily trabeculated.  No bladder tumors were identified.  Both ureteral orifices were identified and had clear efflux.  At this point the XPS greenlight laser fiber was passed through the scope and set at 90 W.  The bladder neck tissue was vaporized.  There was increased to 120 W and obstructive tissue from the verumontanum to the bladder neck was vaporized.  Power was then increased to 180 W and remaining obstructive tissue was vaporized.  Bleeders were controlled with the coagulative setting.  10 cc of viscous Xylocaine was instilled within the urethra.  A 20 French silicone catheter was placed and irrigated until clear.  B&O suppository was placed.  Estimated blood loss was 100 cc.  The procedure was then terminated and patient transferred to the recovery room in stable condition.

## 2019-03-05 NOTE — Progress Notes (Signed)
PT and wife verbalize understanding of foley catheter and how to empty it and how to irrigate it

## 2019-03-05 NOTE — Anesthesia Preprocedure Evaluation (Signed)
Anesthesia Evaluation  Patient identified by MRN, date of birth, ID band Patient awake    Reviewed: Allergy & Precautions, NPO status , Patient's Chart, lab work & pertinent test results  History of Anesthesia Complications Negative for: history of anesthetic complications  Airway Mallampati: II  TM Distance: >3 FB Neck ROM: Full    Dental no notable dental hx.    Pulmonary neg sleep apnea, neg COPD, former smoker,    breath sounds clear to auscultation- rhonchi (-) wheezing      Cardiovascular Exercise Tolerance: Good (-) hypertension(-) CAD, (-) Past MI, (-) Cardiac Stents and (-) CABG  Rhythm:Regular Rate:Normal - Systolic murmurs and - Diastolic murmurs    Neuro/Psych neg Seizures negative neurological ROS  negative psych ROS   GI/Hepatic Neg liver ROS, GERD  ,  Endo/Other  negative endocrine ROSneg diabetes  Renal/GU negative Renal ROS     Musculoskeletal  (+) Arthritis ,   Abdominal (+) + obese,   Peds  Hematology negative hematology ROS (+)   Anesthesia Other Findings Past Medical History: No date: Arthritis No date: GERD (gastroesophageal reflux disease) No date: Hyperlipidemia 08/2018: Left inguinal hernia   Reproductive/Obstetrics                             Anesthesia Physical Anesthesia Plan  ASA: II  Anesthesia Plan: General   Post-op Pain Management:    Induction: Intravenous  PONV Risk Score and Plan: 1 and Ondansetron and Dexamethasone  Airway Management Planned: Oral ETT  Additional Equipment:   Intra-op Plan:   Post-operative Plan: Extubation in OR  Informed Consent: I have reviewed the patients History and Physical, chart, labs and discussed the procedure including the risks, benefits and alternatives for the proposed anesthesia with the patient or authorized representative who has indicated his/her understanding and acceptance.     Dental advisory  given  Plan Discussed with: CRNA and Anesthesiologist  Anesthesia Plan Comments:         Anesthesia Quick Evaluation

## 2019-03-05 NOTE — Discharge Instructions (Signed)
AMBULATORY SURGERY  DISCHARGE INSTRUCTIONS   1) The drugs that you were given will stay in your system until tomorrow so for the next 24 hours you should not:  A) Drive an automobile B) Make any legal decisions C) Drink any alcoholic beverage   2) You may resume regular meals tomorrow.  Today it is better to start with liquids and gradually work up to solid foods.  You may eat anything you prefer, but it is better to start with liquids, then soup and crackers, and gradually work up to solid foods.   3) Please notify your doctor immediately if you have any unusual bleeding, trouble breathing, redness and pain at the surgery site, drainage, fever, or pain not relieved by medication.    4) Additional Instructions:        Please contact your physician with any problems or Same Day Surgery at [phone], Monday through Friday 6 am to 4 pm, or [place] at [facility] number at [phone].Benign Prostatic Hyperplasia  Benign prostatic hyperplasia (BPH) is an enlarged prostate gland that is caused by the normal aging process and not by cancer. The prostate is a walnut-sized gland that is involved in the production of semen. It is located in front of the rectum and below the bladder. The bladder stores urine and the urethra is the tube that carries the urine out of the body. The prostate may get bigger as a man gets older. An enlarged prostate can press on the urethra. This can make it harder to pass urine. The build-up of urine in the bladder can cause infection. Back pressure and infection may progress to bladder damage and kidney (renal) failure. What are the causes? This condition is part of a normal aging process. However, not all men develop problems from this condition. If the prostate enlarges away from the urethra, urine flow will not be blocked. If it enlarges toward the urethra and compresses it, there will be problems passing urine. What increases the risk? This  condition is more likely to develop in men over the age of 22 years. What are the signs or symptoms? Symptoms of this condition include:  Getting up often during the night to urinate.  Needing to urinate frequently during the day.  Difficulty starting urine flow.  Decrease in size and strength of your urine stream.  Leaking (dribbling) after urinating.  Inability to pass urine. This needs immediate treatment.  Inability to completely empty your bladder.  Pain when you pass urine. This is more common if there is also an infection.  Urinary tract infection (UTI). How is this diagnosed? This condition is diagnosed based on your medical history, a physical exam, and your symptoms. Tests will also be done, such as:  A post-void bladder scan. This measures any amount of urine that may remain in your bladder after you finish urinating.  A digital rectal exam. In a rectal exam, your health care provider checks your prostate by putting a lubricated, gloved finger into your rectum to feel the back of your prostate gland. This exam detects the size of your gland and any abnormal lumps or growths.  An exam of your urine (urinalysis).  A prostate specific antigen (PSA) screening. This is a blood test used to screen for prostate cancer.  An ultrasound. This test uses sound waves to electronically produce a picture of your prostate gland. Your health care provider may refer you to a specialist in kidney and prostate diseases (urologist). How is this treated? Once  symptoms begin, your health care provider will monitor your condition (active surveillance or watchful waiting). Treatment for this condition will depend on the severity of your condition. Treatment may include:  Observation and yearly exams. This may be the only treatment needed if your condition and symptoms are mild.  Medicines to relieve your symptoms, including: ? Medicines to shrink the prostate. ? Medicines to relax the  muscle of the prostate.  Surgery in severe cases. Surgery may include: ? Prostatectomy. In this procedure, the prostate tissue is removed completely through an open incision or with a laparoscope or robotics. ? Transurethral resection of the prostate (TURP). In this procedure, a tool is inserted through the opening at the tip of the penis (urethra). It is used to cut away tissue of the inner core of the prostate. The pieces are removed through the same opening of the penis. This removes the blockage. ? Transurethral incision (TUIP). In this procedure, small cuts are made in the prostate. This lessens the prostate's pressure on the urethra. ? Transurethral microwave thermotherapy (TUMT). This procedure uses microwaves to create heat. The heat destroys and removes a small amount of prostate tissue. ? Transurethral needle ablation (TUNA). This procedure uses radio frequencies to destroy and remove a small amount of prostate tissue. ? Interstitial laser coagulation ([place]). This procedure uses a laser to destroy and remove a small amount of prostate tissue. ? Transurethral electrovaporization (TUVP). This procedure uses electrodes to destroy and remove a small amount of prostate tissue. ? Prostatic urethral lift. This procedure inserts an implant to push the lobes of the prostate away from the urethra. Follow these instructions at home:  Take over-the-counter and prescription medicines only as told by your health care provider.  Monitor your symptoms for any changes. Contact your health care provider with any changes.  Avoid drinking large amounts of liquid before going to bed or out in public.  Avoid or reduce how much caffeine or alcohol you drink.  Give yourself time when you urinate.  Keep all follow-up visits as told by your health care provider. This is important. Contact a health care provider if:  You have unexplained back pain.  Your symptoms do not get better with treatment.  You  develop side effects from the medicine you are taking.  Your urine becomes very dark or has a bad smell.  Your lower abdomen becomes distended and you have trouble passing your urine. Get help right away if:  You have a fever or chills.  You suddenly cannot urinate.  You feel lightheaded, or very dizzy, or you faint.  There are large amounts of blood or clots in the urine.  Your urinary problems become hard to manage.  You develop moderate to severe low back or flank pain. The flank is the side of your body between the ribs and the hip. These symptoms may represent a serious problem that is an emergency. Do not wait to see if the symptoms will go away. Get medical help right away. Call your local emergency services (911 in the [place] Do not drive yourself to the hospital. Summary  Benign prostatic hyperplasia (BPH) is an enlarged prostate that is caused by the normal aging process and not by cancer.  An enlarged prostate can press on the urethra. This can make it hard to pass urine.  This condition is part of a normal aging process and is more likely to develop in men over the age of 25 years.  Get help right away if  you suddenly cannot urinate. This information is not intended to replace advice given to you by your health care provider. Make sure you discuss any questions you have with your health care provider. Document Released: 06/27/2005 Document Revised: 05/22/2018 Document Reviewed: 08/01/2016 Elsevier Patient Education  2020 [place] Laser Prostate Treatment  Green light laser therapy is a procedure that uses a high-energy laser to get rid of extra prostate tissue by turning the tissue into a vapor. It is less invasive than traditional methods of prostate surgery, which involve cutting out the prostate tissue. Because the tissue is turned into a vapor (vaporized) rather than cut out, there is generally less blood loss. This surgery is used to treat an  enlarged prostate gland (benign prostatic hyperplasia). Tell a health care provider about:  Any allergies you have.  All medicines you are taking, including vitamins, herbs, eye drops, creams, and over-the-counter medicines.  Any problems you or family members have had with anesthetic medicines.  Any blood disorders you have.  Any surgeries you have had.  Any medical conditions you have. What are the risks? Generally, this is a safe procedure. However, problems may occur, including:  Infection.  Bleeding.  Allergic reaction to medicines.  Damage to other structures or organs.  Blood in the urine (hematuria).  Painful urination.  Urinary tract infection.  Erectile dysfunction (rare).  Dry ejaculation.  Scar tissue in the urinary passage. What happens before the procedure? Staying hydrated Follow instructions from your health care provider about hydration, which may include:  Up to 2 hours before the procedure - you may continue to drink clear liquids, such as water, clear fruit juice, black coffee, and plain tea. Eating and drinking restrictions Follow instructions from your health care provider about eating and drinking, which may include:  8 hours before the procedure - stop eating heavy meals or foods such as meat, fried foods, or fatty foods.  6 hours before the procedure - stop eating light meals or foods, such as toast or cereal.  6 hours before the procedure - stop drinking milk or drinks that contain milk.  2 hours before the procedure - stop drinking clear liquids. Medicines  Ask your health care provider about: ? Changing or stopping your regular medicines. This is especially important if you are taking diabetes medicines or blood thinners. ? Taking medicines such as aspirin and ibuprofen. These medicines can thin your blood. Do not take these medicines before your procedure if your health care provider instructs you not to.  You may be prescribed  antibiotic medicine. If so, take your antibiotic as told by your health care provider. Do not stop taking the antibiotic even if you start to feel better. General instructions  Plan to have someone take you home from the hospital or clinic.  If you will be going home right after the procedure, plan to have someone with you for 24 hours. What happens during the procedure?  To reduce your risk of infection: ? Your health care team will wash or sanitize their hands. ? Your skin will be washed with soap.  An IV will be inserted into one of your veins.  You will be given one or more of the following: ? A medicine to help you relax (sedative). ? A medicine to make you fall asleep (general anesthetic). ? A medicine that is injected into your spine to numb the area below and slightly above the injection site (spinal anesthetic).  A tube containing  viewing scopes and instruments (fiber-optic scope) will be passed through the urethra and into the prostate. The opening of the urethra is at the end of the penis.  A thin fiber will be put through the tube and positioned next to the extra prostate tissue.  Pulses of laser light will come from the end of the fiber and be projected onto the extra tissue. Your blood will absorb the light, become hot, and vaporize the extra prostate tissue.  The heat from the laser beam will seal off the blood vessels, which will lessen bleeding.  The fiber-optic scope will be removed.  A thin tube will be inserted into the urethra to drain your urine (urinary catheter). The procedure may vary among health care providers and hospitals. What happens after the procedure?  Your blood pressure, heart rate, breathing rate, and blood oxygen level will be monitored until the medicines you were given have worn off.  Depending on factors such as the amount of prostate tissue that was vaporized, the strength of your bladder, and the amount of bleeding expected, your catheter  may be removed before you go home.  You may be given elastic support stockings to wear to help prevent blood clots in your legs.  Do not drive for 24 hours if you were given a sedative, or for as long as directed by your health care provider. Summary  Green light laser therapy is a procedure that uses a high-energy laser that turns extra prostate tissue into a vapor.  This procedure is less invasive than traditional methods of prostate surgery.  Follow instructions from your health care provider about eating and drinking before the procedure.  Pulses of laser light will come from the end of a thin fiber and be aimed at the extra prostate tissue. Your blood will absorb the light, become hot, and vaporize the extra tissue. This information is not intended to replace advice given to you by your health care provider. Make sure you discuss any questions you have with your health care provider. Document Released: 10/04/2007 Document Revised: 10/18/2018 Document Reviewed: 07/16/2016 Elsevier Patient Education  [place] After This sheet gives you information about how to care for yourself after your procedure. Your health care provider may also give you more specific instructions. If you have problems or questions, contact your health care provider. What can I expect after the procedure? After the procedure, it is common to have:  Swelling and discomfort around your urethra. The opening of the urethra is at the end of the penis.  Blood in your urine. This should go away after a few days.  Trouble urinating or sudden need to urinate (urgency). These problems should get better over time. You may continue to have a thin tube (catheter) inserted into your urethra to help drain your urine from your bladder for a few days after the procedure. Follow these instructions at home: Medicines  Take over-the-counter and prescription medicines only as  told by your health care provider.  If you were prescribed an antibiotic medicine, take it as told by your health care provider. Do not stop taking the antibiotic even if you start to feel better. Bathing  Do not take baths, swim, or use a hot tub until your health care provider approves. Ask your health care provider if you may take showers. You may only be allowed to take sponge baths. Activity   Do not drive for 24 hours if you  were given a medicine to help you relax (sedative) during your procedure.  Do not drive or use heavy machinery while taking prescription pain medicine.  Ask your health care provider what activities are safe for you. Most people can return to normal activities within a few days. ? Do not have sex or engage in sexual activity until your health care provider approves. ? Do not lift anything that is heavier than 10 lb (4.5 kg), or the limit that you are told, until your health care provider says that it is safe. General instructions      If you have a urinary catheter, care for it as told by your health care provider. This may include: ? Washing your hands before and after touching the catheter. ? Emptying your drainage bag when it is ?- full, or emptying it at least 2-3 times a day. ? Keeping the area around the catheter clean and dry. ? Avoiding any bends or breaks in the catheter. ? Keeping air out of the catheter. ? Making sure that the catheter is not placed under water.  Do not use any products that contain nicotine or tobacco, such as cigarettes and e-cigarettes. If you need help quitting, ask your health care provider.  Drink enough fluid to keep your urine pale yellow.  Keep all follow-up visits as told by your health care provider. This is important. Contact a health care provider if:  You have trouble: ? Having a bowel movement. ? Getting an erection.  You have swelling around your urethra and it gets worse.  You have blood in your urine  for more than 2 days after the procedure.  You have pain or burning when you urinate, or other problems that do not go away or cause discomfort.  You have problems with your catheter or your catheter is blocked.  You have a fever.  You have nausea or you vomit.  You have swelling in your legs. Get help right away if:  Your urine has blood clots in it.  Your urine is dark red.  You cannot urinate after your catheter is removed.  You have blood in your stool.  You have severe pain that does not get better with medicine.  You have shortness of breath. Summary  After the procedure, it is common to have swelling and discomfort around your urethra and blood in your urine for a few days.  Some men may have problems urinating after this procedure. These problems should go away after a few days. If you have pain or burning while urinating, contact your health care provider.  If you have a catheter after this procedure, care for it as told by your health care provider.  If you have severe pain, dark red urine, or urine with blood clots, get medical help right away. This information is not intended to replace advice given to you by your health care provider. Make sure you discuss any questions you have with your health care provider. Document Released: 01/05/2017 Document Revised: 10/18/2018 Document Reviewed: 01/05/2017 Elsevier Patient Education  [place], Adult An indwelling urinary catheter is a thin, flexible, germ-free (sterile) tube that is placed into the bladder to help drain urine out of the body. The catheter is inserted into the part of the body that drains urine from the bladder (urethra). Urine drains from the catheter into a drainage bag outside of the body. Taking good care of your catheter will keep it working properly and  help to prevent problems from developing. What are the risks?  Bacteria may get into your bladder and  cause a urinary tract infection.  Urine flow can become blocked. This can happen if the catheter is not working correctly, or if you have sediment or a blood clot in your bladder or the catheter.  Tissue near the catheter may become irritated and bleed. How to wear your catheter and your drainage bag Supplies needed  Adhesive tape or a leg strap.  Alcohol wipe or soap and water (if you use tape).  A clean towel (if you use tape).  Overnight drainage bag.  Smaller drainage bag (leg bag). Wearing your catheter and bag Use adhesive tape or a leg strap to attach your catheter to your leg.  Make sure the catheter is not pulled tight.  If a leg strap gets wet, replace it with a dry one.  If you use adhesive tape: 1. Use an alcohol wipe or soap and water to wash off any stickiness on your skin where you had tape before. 2. Use a clean towel to pat-dry the area. 3. Apply the new tape. You should have received a large overnight drainage bag and a smaller leg bag that fits underneath clothing.  You may wear the overnight bag at any time, but you should not wear the leg bag at night.  Always wear the leg bag below your knee.  Make sure the overnight drainage bag is always lower than the level of your bladder, but do not let it touch the floor. Before you go to sleep, hang the bag inside a wastebasket that is covered by a clean plastic bag. How to care for your skin around the catheter     Supplies needed  A clean washcloth.  Water and mild soap.  A clean towel. Caring for your skin and catheter  Every day, use a clean washcloth and soapy water to clean the skin around your catheter. ? Wash your hands with soap and water. ? Wet a washcloth in warm water and mild soap. ? Clean the skin around your urethra. ? If you are male: ? Use one hand to gently spread the folds of skin around your vagina (labia). ? With the washcloth in your other hand, wipe the inner side of your  labia on each side. Do this in a front-to-back direction. ? If you are male: ? Use one hand to pull back any skin that covers the end of your penis (foreskin). ? With the washcloth in your other hand, wipe your penis in small circles. Start wiping at the tip of your penis, then move outward from the catheter. ? Move the foreskin back in place, if this applies. ? With your free hand, hold the catheter close to where it enters your body. Keep holding the catheter during cleaning so it does not get pulled out. ? Use your other hand to clean the catheter with the washcloth. ? Only wipe downward on the catheter. ? Do not wipe upward toward your body, because that may push bacteria into your urethra and cause infection. ? Use a clean towel to pat-dry the catheter and the skin around it. Make sure to wipe off all soap. ? Wash your hands with soap and water.  Shower every day. Do not take baths.  Do not use cream, ointment, or lotion on the area where the catheter enters your body, unless your health care provider tells you to do that.  Do not use  powders, sprays, or lotions on your genital area.  Check your skin around the catheter every day for signs of infection. Check for: ? Redness, swelling, or pain. ? Fluid or blood. ? Warmth. ? Pus or a bad smell. How to empty the drainage bag Supplies needed  Rubbing alcohol.  Gauze pad or cotton ball.  Adhesive tape or a leg strap. Emptying the bag Empty your drainage bag (your overnight drainage bag or your leg bag) when it is ?- full, or at least 2-3 times a day. Clean the drainage bag according to the manufacturer's instructions or as told by your health care provider. 1. Wash your hands with soap and water. 2. Detach the drainage bag from your leg. 3. Hold the drainage bag over the toilet or a clean container. Make sure the drainage bag is lower than your hips and bladder. This stops urine from going back into the tubing and into your  bladder. 4. Open the pour spout at the bottom of the bag. 5. Empty the urine into the toilet or container. Do not let the pour spout touch any surface. This precaution is important to prevent bacteria from getting in the bag and causing infection. 6. Apply rubbing alcohol to a gauze pad or cotton ball. 7. Use the gauze pad or cotton ball to clean the pour spout. 8. Close the pour spout. 9. Attach the bag to your leg with adhesive tape or a leg strap. 10. Wash your hands with soap and water. How to change the drainage bag Supplies needed:  Alcohol wipes.  A clean drainage bag.  Adhesive tape or a leg strap. Changing the bag Replace your drainage bag with a clean bag if it leaks, starts to smell bad, or looks dirty. 1. Wash your hands with soap and water. 2. Detach the dirty drainage bag from your leg. 3. Pinch the catheter with your fingers so that urine does not spill out. 4. Disconnect the catheter tube from the drainage tube at the connection valve. Do not let the tubes touch any surface. 5. Clean the end of the catheter tube with an alcohol wipe. Use a different alcohol wipe to clean the end of the drainage tube. 6. Connect the catheter tube to the drainage tube of the clean bag. 7. Attach the clean bag to your leg with adhesive tape or a leg strap. Avoid attaching the new bag too tightly. 8. Wash your hands with soap and water. General instructions   Never pull on your catheter or try to remove it. Pulling can damage your internal tissues.  Always wash your hands before and after you handle your catheter or drainage bag. Use a mild, fragrance-free soap. If soap and water are not available, use hand sanitizer.  Always make sure there are no twists or bends (kinks) in the catheter tube.  Always make sure there are no leaks in the catheter or drainage bag.  Drink enough fluid to keep your urine pale yellow.  Do not take baths, swim, or use a hot tub.  If you are male, wipe  from front to back after having a bowel movement. Contact a health care provider if:  Your urine is cloudy.  Your urine smells unusually bad.  Your catheter gets clogged.  Your catheter starts to leak.  Your bladder feels full. Get help right away if:  You have redness, swelling, or pain where the catheter enters your body.  You have fluid, blood, pus, or a bad smell coming from  the area where the catheter enters your body.  The area where the catheter enters your body feels warm to the touch.  You have a fever.  You have pain in your abdomen, legs, lower back, or bladder.  You see blood in the catheter.  Your urine is pink or red.  You have nausea, vomiting, or chills.  Your urine is not draining into the bag.  Your catheter gets pulled out. Summary  An indwelling urinary catheter is a thin, flexible, germ-free (sterile) tube that is placed into the bladder to help drain urine out of the body.  The catheter is inserted into the part of the body that drains urine from the bladder (urethra).  Take good care of your catheter to keep it working properly and help prevent problems from developing.  Always wash your hands before and after you handle your catheter or drainage bag.  Never pull on your catheter or try to remove it. This information is not intended to replace advice given to you by your health care provider. Make sure you discuss any questions you have with your health care provider. Document Released: 06/27/2005 Document Revised: 10/19/2018 Document Reviewed: 02/10/2017 Elsevier Patient Education  [place], Adult An indwelling urinary catheter is a thin tube that is put into your bladder. The tube helps to drain pee (urine) out of your body. The tube goes in through your urethra. Your urethra is where pee comes out of your body. Your pee will come out through the catheter, then it will go into a bag (drainage  bag). Take good care of your catheter so it will work well. How to wear your catheter and bag Supplies needed  Sticky tape (adhesive tape) or a leg strap.  Alcohol wipe or soap and water (if you use tape).  A clean towel (if you use tape).  Large overnight bag.  Smaller bag (leg bag). Wearing your catheter Attach your catheter to your leg with tape or a leg strap.  Make sure the catheter is not pulled tight.  If a leg strap gets wet, take it off and put on a dry strap.  If you use tape to hold the bag on your leg: 1. Use an alcohol wipe or soap and water to wash your skin where the tape made it sticky before. 2. Use a clean towel to pat-dry that skin. 3. Use new tape to make the bag stay on your leg. Wearing your bags You should have been given a large overnight bag.  You may wear the overnight bag in the day or night.  Always have the overnight bag lower than your bladder.  Do not let the bag touch the floor.  Before you go to sleep, put a clean plastic bag in a wastebasket. Then hang the overnight bag inside the wastebasket. You should also have a smaller leg bag that fits under your clothes.  Always wear the leg bag below your knee.  Do not wear your leg bag at night. How to care for your skin and catheter Supplies needed  A clean washcloth.  Water and mild soap.  A clean towel. Caring for your skin and catheter      Clean the skin around your catheter every day: ? Wash your hands with soap and water. ? Wet a clean washcloth in warm water and mild soap. ? Clean the skin around your urethra. ? If you are male: ? Gently spread the folds of  skin around your vagina (labia). ? With the washcloth in your other hand, wipe the inner side of your labia on each side. Wipe from front to back. ? If you are male: ? Pull back any skin that covers the end of your penis (foreskin). ? With the washcloth in your other hand, wipe your penis in small circles. Start wiping  at the tip of your penis, then move away from the catheter. ? Move the foreskin back in place, if needed. ? With your free hand, hold the catheter close to where it goes into your body. ? Keep holding the catheter during cleaning so it does not get pulled out. ? With the washcloth in your other hand, clean the catheter. ? Only wipe downward on the catheter. ? Do not wipe upward toward your body. Doing this may push germs into your urethra and cause infection. ? Use a clean towel to pat-dry the catheter and the skin around it. Make sure to wipe off all soap. ? Wash your hands with soap and water.  Shower every day. Do not take baths.  Do not use cream, ointment, or lotion on the area where the catheter goes into your body, unless your doctor tells you to.  Do not use powders, sprays, or lotions on your genital area.  Check your skin around the catheter every day for signs of infection. Check for: ? Redness, swelling, or pain. ? Fluid or blood. ? Warmth. ? Pus or a bad smell. How to empty the bag Supplies needed  Rubbing alcohol.  Gauze pad or cotton ball.  Tape or a leg strap. Emptying the bag Pour the pee out of your bag when it is ?- full, or at least 2-3 times a day. Do this for your overnight bag and your leg bag. 1. Wash your hands with soap and water. 2. Separate (detach) the bag from your leg. 3. Hold the bag over the toilet or a clean pail. Keep the bag lower than your hips and bladder. This is so the pee (urine) does not go back into the tube. 4. Open the pour spout. It is at the bottom of the bag. 5. Empty the pee into the toilet or pail. Do not let the pour spout touch any surface. 6. Put rubbing alcohol on a gauze pad or cotton ball. 7. Use the gauze pad or cotton ball to clean the pour spout. 8. Close the pour spout. 9. Attach the bag to your leg with tape or a leg strap. 10. Wash your hands with soap and water. Follow instructions for cleaning the drainage  bag:  From the product maker.  As told by your doctor. How to change the bag Supplies needed  Alcohol wipes.  A clean bag.  Tape or a leg strap. Changing the bag Replace your bag when it starts to leak, smell bad, or look dirty. 1. Wash your hands with soap and water. 2. Separate the dirty bag from your leg. 3. Pinch the catheter with your fingers so that pee does not spill out. 4. Separate the catheter tube from the bag tube where these tubes connect (at the connection valve). Do not let the tubes touch any surface. 5. Clean the end of the catheter tube with an alcohol wipe. Use a different alcohol wipe to clean the end of the bag tube. 6. Connect the catheter tube to the tube of the clean bag. 7. Attach the clean bag to your leg with tape or a leg  strap. Do not make the bag tight on your leg. 8. Wash your hands with soap and water. General rules   Never pull on your catheter. Never try to take it out. Doing that can hurt you.  Always wash your hands before and after you touch your catheter or bag. Use a mild, fragrance-free soap. If you do not have soap and water, use hand sanitizer.  Always make sure there are no twists or bends (kinks) in the catheter tube.  Always make sure there are no leaks in the catheter or bag.  Drink enough fluid to keep your pee pale yellow.  Do not take baths, swim, or use a hot tub.  If you are male, wipe from front to back after you poop (have a bowel movement). Contact a doctor if:  Your pee is cloudy.  Your pee smells worse than usual.  Your catheter gets clogged.  Your catheter leaks.  Your bladder feels full. Get help right away if:  You have redness, swelling, or pain where the catheter goes into your body.  You have fluid, blood, pus, or a bad smell coming from the area where the catheter goes into your body.  Your skin feels warm where the catheter goes into your body.  You have a fever.  You have pain in  your: ? Belly (abdomen). ? Legs. ? Lower back. ? Bladder.  You see blood in the catheter.  Your pee is pink or red.  You feel sick to your stomach (nauseous).  You throw up (vomit).  You have chills.  Your pee is not draining into the bag.  Your catheter gets pulled out. Summary  An indwelling urinary catheter is a thin tube that is placed into the bladder to help drain pee (urine) out of the body.  The catheter is placed into the part of the body that drains pee from the bladder (urethra).  Taking good care of your catheter will keep it working properly and help prevent problems.  Always wash your hands before and after touching your catheter or bag.  Never pull on your catheter or try to take it out. This information is not intended to replace advice given to you by your health care provider. Make sure you discuss any questions you have with your health care provider. Document Released: 10/22/2012 Document Revised: 10/19/2018 Document Reviewed: 02/10/2017 Elsevier Patient Education  2020 [employer].

## 2019-03-05 NOTE — Anesthesia Post-op Follow-up Note (Signed)
Anesthesia QCDR form completed.        

## 2019-03-06 ENCOUNTER — Encounter: Payer: Self-pay | Admitting: Urology

## 2019-03-06 ENCOUNTER — Encounter: Payer: PPO | Admitting: Physical Therapy

## 2019-03-07 ENCOUNTER — Encounter: Payer: PPO | Admitting: Physical Therapy

## 2019-03-11 ENCOUNTER — Encounter: Payer: PPO | Admitting: Physical Therapy

## 2019-03-13 ENCOUNTER — Encounter: Payer: PPO | Admitting: Physical Therapy

## 2019-03-20 DIAGNOSIS — R338 Other retention of urine: Secondary | ICD-10-CM | POA: Diagnosis not present

## 2019-03-20 DIAGNOSIS — R972 Elevated prostate specific antigen [PSA]: Secondary | ICD-10-CM | POA: Diagnosis not present

## 2019-03-20 DIAGNOSIS — N401 Enlarged prostate with lower urinary tract symptoms: Secondary | ICD-10-CM | POA: Diagnosis not present

## 2019-04-18 DIAGNOSIS — R109 Unspecified abdominal pain: Secondary | ICD-10-CM | POA: Diagnosis not present

## 2019-04-18 DIAGNOSIS — N390 Urinary tract infection, site not specified: Secondary | ICD-10-CM | POA: Diagnosis not present

## 2019-04-23 ENCOUNTER — Other Ambulatory Visit: Payer: Self-pay | Admitting: Urology

## 2019-04-23 DIAGNOSIS — R102 Pelvic and perineal pain: Secondary | ICD-10-CM | POA: Diagnosis not present

## 2019-04-23 DIAGNOSIS — M5489 Other dorsalgia: Secondary | ICD-10-CM | POA: Diagnosis not present

## 2019-04-23 DIAGNOSIS — N393 Stress incontinence (female) (male): Secondary | ICD-10-CM | POA: Diagnosis not present

## 2019-04-24 ENCOUNTER — Other Ambulatory Visit
Admission: RE | Admit: 2019-04-24 | Discharge: 2019-04-24 | Disposition: A | Discharge status: Home / Self Care | Payer: PPO | Source: Ambulatory Visit | Attending: Urology | Admitting: Urology

## 2019-04-24 ENCOUNTER — Ambulatory Visit
Admission: RE | Admit: 2019-04-24 | Discharge: 2019-04-24 | Disposition: A | Discharge status: Home / Self Care | Payer: PPO | Source: Ambulatory Visit | Attending: Urology | Admitting: Urology

## 2019-04-24 ENCOUNTER — Other Ambulatory Visit: Payer: Self-pay

## 2019-04-24 ENCOUNTER — Encounter (INDEPENDENT_AMBULATORY_CARE_PROVIDER_SITE_OTHER): Payer: Self-pay

## 2019-04-24 DIAGNOSIS — R102 Pelvic and perineal pain: Secondary | ICD-10-CM | POA: Insufficient documentation

## 2019-04-24 DIAGNOSIS — R9341 Abnormal radiologic findings on diagnostic imaging of renal pelvis, ureter, or bladder: Secondary | ICD-10-CM | POA: Diagnosis not present

## 2019-04-24 DIAGNOSIS — M5489 Other dorsalgia: Secondary | ICD-10-CM

## 2019-04-24 DIAGNOSIS — M549 Dorsalgia, unspecified: Secondary | ICD-10-CM | POA: Insufficient documentation

## 2019-04-24 DIAGNOSIS — I700 Atherosclerosis of aorta: Secondary | ICD-10-CM | POA: Insufficient documentation

## 2019-04-24 DIAGNOSIS — R310 Gross hematuria: Secondary | ICD-10-CM | POA: Diagnosis not present

## 2019-04-24 LAB — CREATININE, SERUM
Creatinine, Ser: 1.28 mg/dL — ABNORMAL HIGH (ref 0.61–1.24)
GFR calc Af Amer: >60 mL/min (ref >60)
GFR calc non Af Amer: 54 mL/min — ABNORMAL LOW (ref >60)

## 2019-04-24 MED ORDER — IOHEXOL 300 MG/ML  SOLN
100.0000 mL | Freq: Once | INTRAMUSCULAR | Status: AC | PRN
  Administered 2019-04-24: 125 mL via INTRAVENOUS

## 2019-04-26 DIAGNOSIS — R102 Pelvic and perineal pain: Secondary | ICD-10-CM | POA: Diagnosis not present

## 2019-04-26 DIAGNOSIS — R109 Unspecified abdominal pain: Secondary | ICD-10-CM | POA: Diagnosis not present

## 2019-04-26 DIAGNOSIS — R1084 Generalized abdominal pain: Secondary | ICD-10-CM | POA: Diagnosis not present

## 2019-04-30 ENCOUNTER — Encounter: Payer: Self-pay | Admitting: General Surgery

## 2019-04-30 ENCOUNTER — Other Ambulatory Visit: Payer: Self-pay

## 2019-04-30 ENCOUNTER — Encounter: Payer: Self-pay | Admitting: Family Medicine

## 2019-04-30 ENCOUNTER — Ambulatory Visit: Payer: PPO | Admitting: General Surgery

## 2019-04-30 ENCOUNTER — Ambulatory Visit (INDEPENDENT_AMBULATORY_CARE_PROVIDER_SITE_OTHER): Payer: PPO | Admitting: Family Medicine

## 2019-04-30 VITALS — BP 134/70 | HR 64 | Temp 97.1°F | Wt 216.0 lb

## 2019-04-30 VITALS — BP 151/90 | HR 69 | Temp 97.7°F | Ht 67.5 in | Wt 214.0 lb

## 2019-04-30 DIAGNOSIS — S39011A Strain of muscle, fascia and tendon of abdomen, initial encounter: Secondary | ICD-10-CM

## 2019-04-30 DIAGNOSIS — R1030 Lower abdominal pain, unspecified: Secondary | ICD-10-CM | POA: Diagnosis not present

## 2019-04-30 MED ORDER — METHOCARBAMOL 500 MG PO TABS: 500.0000 mg | ORAL_TABLET | Freq: Four times a day (QID) | ORAL | 0 refills | Status: DC

## 2019-04-30 NOTE — Progress Notes (Signed)
Alexander Moss  MRN: [id number] DOB: 1946/01/13  Subjective:  HPI   The patient is a 73 year old male who presents as a walk-in for abdominal pain.  He states it began mid September.  He has seen Urgent care, urology x 2, orthopedics and surgery.  He has been treated for UTI by urgent care and then urology. He was told by surgury to see his primary to get muscle relaxer as she thinks this may be muscular.  It is of note that the patient had knee surgery at the end of June and Prostate surgery in August.  It is also of note that in mid September he did a lot of painting on his house at [name].  Patient Active Problem List   Diagnosis Date Noted  . Non-recurrent unilateral inguinal hernia without obstruction or gangrene 08/29/2018  . OA (osteoarthritis) of knee 05/23/2016  . Hearing loss 01/06/2015  . Chronic back pain 12/09/2014  . GERD (gastroesophageal reflux disease) 12/09/2014  . Obesity 12/09/2014  . PSA Elevation 12/09/2014  . Rosacea 12/09/2014  . Family history of GI tract cancer 07/09/2008  . Lumbago 06/25/2007  . Diverticulosis of colon without hemorrhage 09/08/2006   Past Medical History:  Diagnosis Date  . Arthritis   . GERD (gastroesophageal reflux disease)   . Hyperlipidemia   . Left inguinal hernia 08/2018   Family History  Problem Relation Age of Onset  . Diabetes Mother   . Lung cancer Father   . Liver cancer Sister    Past Surgical History:  Procedure Laterality Date  . GREEN LIGHT LASER TURP (TRANSURETHRAL RESECTION OF PROSTATE N/A 03/05/2019   Procedure: GREEN LIGHT LASER TURP (TRANSURETHRAL RESECTION OF PROSTATE;  Surgeon: Royston Cowper, MD;  Location: [facility];  Service: Urology;  Laterality: N/A;  . [place]  . INGUINAL HERNIA REPAIR Left 09/07/2018   Procedure: OPEN LEFT HERNIA REPAIR - INGUINAL WITH MESH;  Surgeon: Fredirick Maudlin, MD;  Location: [facility];  Service: General;  Laterality: Left;  . JOINT REPLACEMENT Right 2017    Knee  . KNEE SURGERY Bilateral 2003  . TONSILLECTOMY AND ADENOIDECTOMY    . TOTAL KNEE ARTHROPLASTY Right 05/23/2016   Procedure: RIGHT TOTAL KNEE ARTHROPLASTY;  Surgeon: Gaynelle Arabian, MD;  Location: [facility];  Service: Orthopedics;  Laterality: Right;  . TOTAL KNEE ARTHROPLASTY Left 01/07/2019   Procedure: TOTAL KNEE ARTHROPLASTY;  Surgeon: Gaynelle Arabian, MD;  Location: [facility];  Service: Orthopedics;  Laterality: Left;  47min   Social History   Socioeconomic History  . Marital status: Married    Spouse name: Not on file  . Number of children: 1  . Years of education: [name]  . Highest education level: Bachelor's degree (e.g., BA, AB, BS)  Occupational History  . Occupation: Retired  Scientific laboratory technician  . Financial resource strain: Not hard at all  . Food insecurity    Worry: Never true    Inability: Never true  . Transportation needs    Medical: No    Non-medical: No  Tobacco Use  . Smoking status: Former Smoker    Packs/day: 2.00    Years: 15.00    Pack years: 30.00    Quit date: 05/30/1980    Years since quitting: 38.9  . Smokeless tobacco: Never Used  . Tobacco comment: remote smoking history, quit in 1981  Substance and Sexual Activity  . Alcohol use: Yes    Alcohol/week: 1.0 - 3.0 standard drinks  Types: 1 - 3 Standard drinks or equivalent per week    Comment: occ beer or wine  . Drug use: No  . Sexual activity: Not on file  Lifestyle  . Physical activity    Days per week: Not on file    Minutes per session: Not on file  . Stress: Not at all  Relationships  . Social Herbalist on phone: Not on file    Gets together: Not on file    Attends religious service: Not on file    Active member of club or organization: Not on file    Attends meetings of clubs or organizations: Not on file    Relationship status: Not on file  . Intimate partner violence    Fear of current or ex partner: Not on file    Emotionally abused: Not on file    Physically  abused: Not on file    Forced sexual activity: Not on file  Other Topics Concern  . Not on file  Social History Narrative  . Not on file    Outpatient Encounter Medications as of 04/30/2019  Medication Sig  . Calcium Carb-Cholecalciferol (CALCIUM 600 + D PO) Take 1 tablet by mouth. Three times a week  . calcium carbonate (TUMS EX) 750 MG chewable tablet Chew 750-1,500 mg by mouth daily as needed for heartburn.  . docusate sodium (COLACE) 100 MG capsule Take 2 capsules (200 mg total) by mouth 2 (two) times daily.  Marland Kitchen dutasteride (AVODART) 0.5 MG capsule Take 0.5 mg by mouth every morning.   . fexofenadine (ALLEGRA) 180 MG tablet Take 180 mg by mouth every morning.   . naproxen sodium (ALEVE) 220 MG tablet Take 220 mg by mouth 2 (two) times daily.  . Omega-3 Fatty Acids (FISH OIL ULTRA) 1400 MG CAPS Take 1,400 mg by mouth daily.  . tamsulosin (FLOMAX) 0.4 MG CAPS capsule TAKE 1 CAPSULE(0.4 MG) BY MOUTH DAILY (Patient taking differently: Take 0.4 mg by mouth 2 (two) times daily. )  . vitamin B-12 (CYANOCOBALAMIN) 1000 MCG tablet Take 1,000 mcg by mouth daily.   . Vitamin E 400 units TABS Take 400 Units by mouth daily.   . Zinc 25 MG TABS Take 25 mg by mouth daily.    No facility-administered encounter medications on file as of 04/30/2019.    No Known Allergies  Review of Systems  Constitutional: Negative for chills, diaphoresis, fever and malaise/fatigue.  HENT: Positive for congestion. Negative for ear pain, sinus pain and sore throat.   Respiratory: Negative for cough and shortness of breath.   Cardiovascular: Negative for chest pain and palpitations.  Gastrointestinal: Positive for abdominal pain and heartburn. Negative for blood in stool, constipation, diarrhea, melena, nausea and vomiting.  Genitourinary: Positive for frequency (secondary to procedure per urology.) and hematuria. Negative for dysuria.  Neurological: Negative for dizziness and headaches.    Objective:  BP 134/70  (BP Location: Right Arm, Patient Position: Sitting, Cuff Size: Normal)   Pulse 64   Temp (!) 97.1 F (36.2 C) (Skin)   Wt 216 lb (98 kg)   SpO2 94%   BMI 33.33 kg/m   Physical Exam  Constitutional: He is oriented to person, place, and time and well-developed, well-nourished, and in no distress.  HENT:  Head: Normocephalic.  Eyes: Conjunctivae are normal.  Neck: Neck supple.  Cardiovascular: Normal rate and regular rhythm.  Pulmonary/Chest: Effort normal and breath sounds normal.  Abdominal: Soft. He exhibits no mass. There is abdominal  tenderness. There is no rebound.  No CVA tenderness to percussion posteriorly. Tender in the lower abdominal muscles to lift legs or sit up from a lying position. No masses or hernia felt.  Musculoskeletal: Normal range of motion.     Comments: Well healed scars of both knees from TKA (right 2017 and left 01-07-19). Good passive ROM. Lower abdominal pains with SLR attempts. No swelling of feet or ankles.  Neurological: He is alert and oriented to person, place, and time.  Skin: No rash noted.  Psychiatric: Mood, affect and judgment normal.    Assessment and Plan :  1. Strain of muscle, fascia and tendon of abdomen, initial encounter Having lower abdominal pains with certain movements that started after [place] the beach around 04[place]18[place]19. Went to a Urgent Care for evaluation and treated for a UTI. Upon return home, he went to his urologist who did his green laser prostate surgery on 03-05-19 by Dr. Eliberto Ivory. CT scan on 04-24-19 did not show any kidney stones, masses or organomegaly. Was evaluated by Dr. Celine Ahr (surgeon) today and was advised to be seen here for muscle strain of abdomen. Will treat with muscle relaxer, rest and moist heat applications. Recheck if no better in 7-10 days. - methocarbamol (ROBAXIN) 500 MG tablet; Take 1 tablet (500 mg total) by mouth 4 (four) times daily.  Dispense: 40 tablet; Refill: 0

## 2019-04-30 NOTE — Patient Instructions (Signed)
   Follow-up with our office as needed.  Please call and ask to speak with a nurse if you develop questions or concerns.  

## 2019-04-30 NOTE — Progress Notes (Signed)
Patient ID: Alexander Moss, male   DOB: 07-Dec-1945, 73 y.o.   MRN: [id number]  Chief Complaint  Patient presents with  . Abdominal Pain    HPI Alexander Moss is a 73 y.o. male.   Performed an open L inguinal hernia repair on him in February 2020.  He did well from that procedure and was discharged from our clinic after his postoperative visit.  He is sequently went on to have total knee arthroplasty after which he developed urinary retention.  He was evaluated by urology and noted to have bladder outlet obstruction secondary to an enlarged prostate.  He had a transurethral resection of the prostate at the end of August.  He continued to have lower abdominal pain and recently had a CT scan of the abdomen and pelvis, ordered by urology, this was done on 24 April 2019.  No etiology for his symptoms could be identified.  He was seen in the orthopedics clinic; I do not have their note available for review, but apparently he was referred to see me due to ongoing abdominal pain.  He has also received a referral to gastroenterology, but I do not see an appointment scheduled for him at this time.  Alexander Moss states that he only has pain in his lower abdomen when walking or when he tries to lift either leg up.  The pain is in a bandlike distribution just at the belt line.  He denies any fevers or chills.  No nausea or vomiting.  He is not experiencing any diarrhea or constipation.   Past Medical History:  Diagnosis Date  . Arthritis   . GERD (gastroesophageal reflux disease)   . Hyperlipidemia   . Left inguinal hernia 08/2018    Past Surgical History:  Procedure Laterality Date  . GREEN LIGHT LASER TURP (TRANSURETHRAL RESECTION OF PROSTATE N/A 03/05/2019   Procedure: GREEN LIGHT LASER TURP (TRANSURETHRAL RESECTION OF PROSTATE;  Surgeon: Royston Cowper, MD;  Location: [facility];  Service: Urology;  Laterality: N/A;  . [place]  . INGUINAL HERNIA REPAIR Left 09/07/2018   Procedure:  OPEN LEFT HERNIA REPAIR - INGUINAL WITH MESH;  Surgeon: Fredirick Maudlin, MD;  Location: [facility];  Service: General;  Laterality: Left;  . JOINT REPLACEMENT Right 2017   Knee  . KNEE SURGERY Bilateral 2003  . TONSILLECTOMY AND ADENOIDECTOMY    . TOTAL KNEE ARTHROPLASTY Right 05/23/2016   Procedure: RIGHT TOTAL KNEE ARTHROPLASTY;  Surgeon: Gaynelle Arabian, MD;  Location: [facility];  Service: Orthopedics;  Laterality: Right;  . TOTAL KNEE ARTHROPLASTY Left 01/07/2019   Procedure: TOTAL KNEE ARTHROPLASTY;  Surgeon: Gaynelle Arabian, MD;  Location: [facility];  Service: Orthopedics;  Laterality: Left;  25min    Family History  Problem Relation Age of Onset  . Diabetes Mother   . Lung cancer Father   . Liver cancer Sister     Social History Social History   Tobacco Use  . Smoking status: Former Smoker    Packs/day: 2.00    Years: 15.00    Pack years: 30.00    Quit date: 05/30/1980    Years since quitting: 38.9  . Smokeless tobacco: Never Used  . Tobacco comment: remote smoking history, quit in 1981  Substance Use Topics  . Alcohol use: Yes    Alcohol/week: 1.0 - 3.0 standard drinks    Types: 1 - 3 Standard drinks or equivalent per week    Comment: occ beer or wine  . Drug use: No  No Known Allergies  Current Outpatient Medications  Medication Sig Dispense Refill  . Calcium Carb-Cholecalciferol (CALCIUM 600 + D PO) Take 1 tablet by mouth. Three times a week    . calcium carbonate (TUMS EX) 750 MG chewable tablet Chew 750-1,500 mg by mouth daily as needed for heartburn.    . docusate sodium (COLACE) 100 MG capsule Take 2 capsules (200 mg total) by mouth 2 (two) times daily. 120 capsule 3  . dutasteride (AVODART) 0.5 MG capsule Take 0.5 mg by mouth every morning.     . fexofenadine (ALLEGRA) 180 MG tablet Take 180 mg by mouth every morning.     . naproxen sodium (ALEVE) 220 MG tablet Take 220 mg by mouth 2 (two) times daily.    . Omega-3 Fatty Acids (FISH OIL ULTRA) 1400 MG CAPS Take  1,400 mg by mouth daily.    . tamsulosin (FLOMAX) 0.4 MG CAPS capsule TAKE 1 CAPSULE(0.4 MG) BY MOUTH DAILY (Patient taking differently: Take 0.4 mg by mouth 2 (two) times daily. ) 90 capsule 4  . vitamin B-12 (CYANOCOBALAMIN) 1000 MCG tablet Take 1,000 mcg by mouth daily.     . Vitamin E 400 units TABS Take 400 Units by mouth daily.     . Zinc 25 MG TABS Take 25 mg by mouth daily.     . methocarbamol (ROBAXIN) 500 MG tablet Take 1 tablet (500 mg total) by mouth 4 (four) times daily. 40 tablet 0   No current facility-administered medications for this visit.     Review of Systems Review of Systems  Gastrointestinal: Positive for abdominal pain.  Genitourinary: Positive for dysuria and hematuria.       Secondary to urology procedure  Musculoskeletal: Positive for back pain.  All other systems reviewed and are negative.   Blood pressure (!) 151/90, pulse 69, temperature 97.7 F (36.5 C), height 5' 7.5" (1.715 m), weight 214 lb (97.1 kg), SpO2 95 %. Body mass index is 33.02 kg/m.   Physical Exam Physical Exam Vitals signs reviewed.  Constitutional:      General: He is not in acute distress.    Appearance: He is obese.  HENT:     Head: Normocephalic and atraumatic.  Eyes:     General: No scleral icterus. Cardiovascular:     Rate and Rhythm: Normal rate and regular rhythm.  Pulmonary:     Effort: Pulmonary effort is normal. No respiratory distress.     Breath sounds: No stridor.  Abdominal:     General: Abdomen is protuberant. Bowel sounds are normal.     Palpations: Abdomen is soft.     Tenderness: There is no abdominal tenderness.     Hernia: No hernia is present.     Comments: There is no tenderness to palpation in the area that he indicates has been painful.  His hernia repair is intact.  Genitourinary:    Comments: Deferred Skin:    General: Skin is warm and dry.  Neurological:     General: No focal deficit present.     Mental Status: He is alert and oriented to  person, place, and time.  Psychiatric:        Mood and Affect: Mood normal.        Behavior: Behavior normal.     Data Reviewed I reviewed the CT scan of the abdomen and pelvis that was performed on 24 April 2019.  Briefly, there is some bladder wall thickening but no gastrointestinal findings and no hernia.  The  radiologist impression is copied here and I am in agreement with their findings. CLINICAL DATA:  Severe pelvic pain/pressure and gross hematuria after recent laser TURP.  EXAM: CT ABDOMEN AND PELVIS WITHOUT AND WITH CONTRAST  TECHNIQUE: Multidetector CT imaging of the abdomen and pelvis was performed following the standard protocol before and following the bolus administration of intravenous contrast.  CONTRAST:  137mL OMNIPAQUE IOHEXOL 300 MG/ML  SOLN  COMPARISON:  None.  FINDINGS: Lower chest: Left lower lobe nodules measure up to 4 mm, nonspecific. Heart size normal. No pericardial or pleural effusion. Distal esophageal wall thickening can be seen with gastroesophageal reflux.  Hepatobiliary: 8 mm low-attenuation lesion in the left hepatic lobe is too small to characterize. In the absence of known malignancy, a cyst or hemangioma is likely. Liver and gallbladder are otherwise unremarkable. No biliary ductal dilatation.  Pancreas: Negative.  Spleen: Negative.  Adrenals/Urinary Tract: Adrenal glands and right kidney are unremarkable. 3.0 cm fluid density lesion in the interpolar left kidney is indicative of a cyst. Left kidney is otherwise unremarkable. Distal right ureter is poorly opacified, limiting evaluation. Otherwise, no filling defects in the opacified portions of the intrarenal collecting systems and visualized portions of the ureters. Bladder is very thick-walled with irregularity inferiorly, presumably related to recent trans urethral resection of the prostate.  Stomach/Bowel: Stomach, small bowel, appendix and colon  are unremarkable.  Vascular/Lymphatic: Atherosclerotic calcification of the aorta without aneurysm. No pathologically enlarged lymph nodes.  Reproductive: Prostatectomy.  Other: No free fluid.  Mesenteries and peritoneum are unremarkable.  Musculoskeletal: Degenerative changes in the spine. No worrisome lytic or sclerotic lesions.  IMPRESSION: 1. Marked bladder wall thickening with extensive irregularity along the inferior margin of the bladder, presumably related to recent laser TURP. Please correlate clinically. 2. Distal right ureter is poorly opacified. No additional findings to explain the patient's symptoms. 3.  Aortic atherosclerosis (ICD10-170.0).  Assessment This is a 73 year old man who complains of abdominal pain.  It sounds primarily musculoskeletal in nature.  Upon physical examination and review of his CT scan, I do not think there is a surgical cause for his problem.   Plan I have recommended that he seek care with his primary care provider for management of muscle strain or spasm.  We will see him back on an as-needed basis.    Fredirick Maudlin 04/30/2019, 3:28 PM

## 2019-05-08 ENCOUNTER — Other Ambulatory Visit: Payer: Self-pay | Admitting: Family Medicine

## 2019-05-08 DIAGNOSIS — S39011A Strain of muscle, fascia and tendon of abdomen, initial encounter: Secondary | ICD-10-CM

## 2019-05-08 NOTE — Telephone Encounter (Signed)
500mg  is a pretty low dose. If he has only been taking one tablet at a time then he should increase to 2. can send in a new prescription with change in directions if he wants to try that.  Also recommend he take an anti-inflammatory medication along with muscle relaxer. Can send in prescription for meloxicam for him too. (don't take any OTC NSAIDs such as aleve if he starts the meloxicam)

## 2019-05-08 NOTE — Telephone Encounter (Signed)
Pt returned missed call.  Please call pt back to discuss his medication and advise from Dr. Caryn Section at [phone].  Thanks, [employer]

## 2019-05-08 NOTE — Telephone Encounter (Signed)
Pt needing a refill on:  methocarbamol (ROBAXIN) 500 MG tablet  Please call into: [facility] [place], [place] [address] [phone]  Pt wants a call back at [phone] if this can be called in at He states the medication is not helping a whole lot.  Wants to know what Dr Caryn Section would suggest, a refill or something else.  Thanks, [employer]

## 2019-05-08 NOTE — Telephone Encounter (Signed)
Please Review

## 2019-05-09 MED ORDER — METHOCARBAMOL 500 MG PO TABS: 1000.0000 mg | ORAL_TABLET | Freq: Three times a day (TID) | ORAL | 0 refills | Status: DC | PRN

## 2019-05-09 MED ORDER — MELOXICAM 15 MG PO TABS: 15.0000 mg | ORAL_TABLET | Freq: Every day | ORAL | 0 refills | Status: DC

## 2019-05-09 NOTE — Telephone Encounter (Addendum)
Patient states he is taking 1 tablet QID. Patient states he has been taking medication the past week with no relief. New RX sent to pharmacy.

## 2019-05-17 ENCOUNTER — Other Ambulatory Visit: Payer: Self-pay | Admitting: *Deleted

## 2019-05-17 DIAGNOSIS — S39011A Strain of muscle, fascia and tendon of abdomen, initial encounter: Secondary | ICD-10-CM

## 2019-05-17 MED ORDER — MELOXICAM 15 MG PO TABS: 15.0000 mg | ORAL_TABLET | Freq: Every day | ORAL | 2 refills | Status: DC

## 2019-05-17 MED ORDER — METHOCARBAMOL 500 MG PO TABS: 1000.0000 mg | ORAL_TABLET | Freq: Three times a day (TID) | ORAL | 2 refills | Status: DC | PRN

## 2019-05-17 NOTE — Telephone Encounter (Signed)
Patient would like a call back when rx's have been sent to pharmacy.

## 2019-05-17 NOTE — Telephone Encounter (Signed)
Last refills 05/09/2019 and last OV 04/30/2019

## 2019-05-20 ENCOUNTER — Telehealth: Payer: Self-pay

## 2019-05-20 NOTE — Telephone Encounter (Signed)
I called and spoke with pharmacist and was advised that patient was able fill prescription today without PA. I called patient and he informed me that he paid a little over $20 out of pocket for the medication. He says last time he filled the prescription he didn't have to pay anything. Patient says he will be due for a refill in 10 days and would like a PA started to see if insurance will cover the cost. PA started through covermy meds. Awaiting response.

## 2019-05-20 NOTE — Telephone Encounter (Signed)
Patient states that [facility] told him that his Meloxicam & Methocarbamol is needed a prior authorization so that he can get his medication. Patient is requesting a call back from someone to see if the PA was send from. Please advise.

## 2019-06-03 ENCOUNTER — Encounter: Payer: Self-pay | Admitting: Family Medicine

## 2019-06-03 ENCOUNTER — Ambulatory Visit (INDEPENDENT_AMBULATORY_CARE_PROVIDER_SITE_OTHER): Payer: PPO | Admitting: Family Medicine

## 2019-06-03 ENCOUNTER — Other Ambulatory Visit: Payer: Self-pay

## 2019-06-03 DIAGNOSIS — R059 Cough, unspecified: Secondary | ICD-10-CM

## 2019-06-03 DIAGNOSIS — R05 Cough: Secondary | ICD-10-CM

## 2019-06-03 MED ORDER — BENZONATATE 100 MG PO CAPS: 100.0000 mg | ORAL_CAPSULE | Freq: Three times a day (TID) | ORAL | 0 refills | Status: DC | PRN

## 2019-06-03 NOTE — Progress Notes (Signed)
Patient: Alexander Moss Male    DOB: 18-Jun-1946   73 y.o.   MRN: [id number] Visit Date: 06/03/2019  Today's Provider: Vernie Murders, PA   Chief Complaint  Patient presents with   Cough    x 2 weeks   Subjective:    Virtual Visit via Telephone Note  I connected with Alexander Moss on 06/03/19 at 11:20 AM EST by telephone and verified that I am speaking with the correct person using two identifiers.  Location: Patient: [facility] Provider: Office   I discussed the limitations, risks, security and privacy concerns of performing an evaluation and management service by telephone and the availability of in person appointments. I also discussed with the patient that there may be a patient responsible charge related to this service. The patient expressed understanding and agreed to proceed.   History of Present Illness:  Cough This is a new problem. Episode onset: 2 weeks. The cough is non-productive (productice with light brown color). Associated symptoms include headaches, postnasal drip, rhinorrhea and wheezing. Pertinent negatives include no chest pain, chills, ear congestion, ear pain, fever, hemoptysis, myalgias, nasal congestion, sore throat, shortness of breath or sweats. Treatments tried: OTC Cough syrup. The treatment provided mild relief.   Past Medical History:  Diagnosis Date   Arthritis    GERD (gastroesophageal reflux disease)    Hyperlipidemia    Left inguinal hernia 08/2018   Past Surgical History:  Procedure Laterality Date   GREEN LIGHT LASER TURP (TRANSURETHRAL RESECTION OF PROSTATE N/A 03/05/2019   Procedure: GREEN LIGHT LASER TURP (TRANSURETHRAL RESECTION OF PROSTATE;  Surgeon: Royston Cowper, MD;  Location: [facility] ORS;  Service: Urology;  Laterality: N/A;   HEMORRHOID SURGERY  1964   INGUINAL HERNIA REPAIR Left 09/07/2018   Procedure: OPEN LEFT HERNIA REPAIR - INGUINAL WITH MESH;  Surgeon: Fredirick Maudlin, MD;  Location: [facility] OR[facility];   Service: General;  Laterality: Left;   JOINT REPLACEMENT Right 2017   Knee   KNEE SURGERY Bilateral 2003   TONSILLECTOMY AND ADENOIDECTOMY     TOTAL KNEE ARTHROPLASTY Right 05/23/2016   Procedure: RIGHT TOTAL KNEE ARTHROPLASTY;  Surgeon: Gaynelle Arabian, MD;  Location: [facility];  Service: Orthopedics;  Laterality: Right;   TOTAL KNEE ARTHROPLASTY Left 01/07/2019   Procedure: TOTAL KNEE ARTHROPLASTY;  Surgeon: Gaynelle Arabian, MD;  Location: [facility];  Service: Orthopedics;  Laterality: Left;  48min     Family History  Problem Relation Age of Onset   Diabetes Mother    Lung cancer Father    Liver cancer Sister    No Known Allergies  Current Outpatient Medications:    calcium carbonate (TUMS EX) 750 MG chewable tablet, Chew 750-1,500 mg by mouth daily as needed for heartburn., Disp: , Rfl:    docusate sodium (COLACE) 100 MG capsule, Take 2 capsules (200 mg total) by mouth 2 (two) times daily., Disp: 120 capsule, Rfl: 3   dutasteride (AVODART) 0.5 MG capsule, Take 0.5 mg by mouth every morning. , Disp: , Rfl:    fexofenadine (ALLEGRA) 180 MG tablet, Take 180 mg by mouth every morning. , Disp: , Rfl:    meloxicam (MOBIC) 15 MG tablet, Take 1 tablet (15 mg total) by mouth daily., Disp: 14 tablet, Rfl: 2   methocarbamol (ROBAXIN) 500 MG tablet, Take 2 tablets (1,000 mg total) by mouth every 8 (eight) hours as needed for muscle spasms., Disp: 60 tablet, Rfl: 2   Omega-3 Fatty Acids (FISH OIL ULTRA) 1400 MG  CAPS, Take 1,400 mg by mouth daily., Disp: , Rfl:    vitamin B-12 (CYANOCOBALAMIN) 1000 MCG tablet, Take 1,000 mcg by mouth daily. , Disp: , Rfl:    Vitamin E 400 units TABS, Take 400 Units by mouth daily. , Disp: , Rfl:    Zinc 25 MG TABS, Take 25 mg by mouth daily. , Disp: , Rfl:    Calcium Carb-Cholecalciferol (CALCIUM 600 + D PO), Take 1 tablet by mouth. Three times a week, Disp: , Rfl:   Review of Systems  Constitutional: Negative for appetite change, chills and fever.    HENT: Positive for postnasal drip and rhinorrhea. Negative for ear pain and sore throat.   Respiratory: Positive for cough and wheezing. Negative for hemoptysis, chest tightness and shortness of breath.   Cardiovascular: Negative for chest pain and palpitations.  Gastrointestinal: Negative for abdominal pain, nausea and vomiting.  Musculoskeletal: Negative for myalgias.  Neurological: Positive for headaches.   Social History   Tobacco Use   Smoking status: Former Smoker    Packs/day: 2.00    Years: 15.00    Pack years: 30.00    Quit date: 05/30/1980    Years since quitting: 39.0   Smokeless tobacco: Never Used   Tobacco comment: remote smoking history, quit in 1981  Substance Use Topics   Alcohol use: Yes    Alcohol/week: 1.0 - 3.0 standard drinks    Types: 1 - 3 Standard drinks or equivalent per week    Comment: occ beer or wine     Objective:   There were no vitals taken for this visit. There were no vitals filed for this visit.There is no height or weight on file to calculate BMI.  Physical Exam: No apparent respiratory distress during telephonic interview.    Assessment & Plan    1. Cough Onset over the past 2 weeks without fever, shortness of breath, loss of taste or smell, sore throat, earache or nasal congestion. Slight PND with some gray sputum occasionally. No history of tobacco use or asthma. Slight short term relief from Robitussin-DM. Recommend taking Allegra daily and adding Tessalon prn cough. Increase fluids and may use Tums if needed for any acid indigestion. Counseled regarding COVID symptoms and when to consider testing. Recheck prn. - benzonatate (TESSALON) 100 MG capsule; Take 1 capsule (100 mg total) by mouth 3 (three) times daily as needed for cough.  Dispense: 20 capsule; Refill: Schriever, PA  [facility]

## 2019-06-10 ENCOUNTER — Ambulatory Visit (INDEPENDENT_AMBULATORY_CARE_PROVIDER_SITE_OTHER): Payer: PPO | Admitting: Family Medicine

## 2019-06-10 ENCOUNTER — Encounter: Payer: Self-pay | Admitting: Family Medicine

## 2019-06-10 ENCOUNTER — Telehealth: Payer: Self-pay

## 2019-06-10 DIAGNOSIS — R109 Unspecified abdominal pain: Secondary | ICD-10-CM | POA: Diagnosis not present

## 2019-06-10 DIAGNOSIS — R05 Cough: Secondary | ICD-10-CM | POA: Diagnosis not present

## 2019-06-10 MED ORDER — PREDNISONE 20 MG PO TABS: 20.0000 mg | ORAL_TABLET | Freq: Two times a day (BID) | ORAL | 0 refills | Status: AC

## 2019-06-10 MED ORDER — BENZONATATE 100 MG PO CAPS: 100.0000 mg | ORAL_CAPSULE | Freq: Three times a day (TID) | ORAL | 0 refills | Status: DC | PRN

## 2019-06-10 MED ORDER — GABAPENTIN 300 MG PO CAPS: ORAL_CAPSULE | ORAL | 0 refills | Status: DC

## 2019-06-10 NOTE — Telephone Encounter (Signed)
Telephone visit scheduled today at 3:40pm.

## 2019-06-10 NOTE — Progress Notes (Signed)
Patient: Alexander Moss Male    DOB: [birth year]   73 y.o.   MRN: [id number] Visit Date: 06/10/2019  Today's Provider: Lelon Huh, MD   Chief Complaint  Patient presents with  . Abdominal Pain    x 2 months   Subjective:     Virtual Visit via Video Note  I connected with [name] on 06/19/19 at  3:40 PM EST by a video enabled telemedicine application and verified that I am speaking with the correct person using two identifiers.   I discussed the limitations of evaluation and management by telemedicine and the availability of in person appointments. The patient expressed understanding and agreed to proceed.    Abdominal Pain This is a recurrent problem. Episode onset: 2 months ago. The problem occurs intermittently. The problem has been unchanged. Pain location: mid abdomen. The quality of the pain is aching. Pertinent negatives include no fever, nausea or vomiting. The pain is aggravated by movement (also walking). Treatments tried: muscle relaxers and anti-inflammatory. The treatment provided no relief.  Started after doing some heavy yard work. Pain is about 2 inches below umbilicus at midline. Didn't seem to improve at all with methocaribimol. Unchanged over 2 months. No other GI sx. Aggravated by coughing.   No Known Allergies   Current Outpatient Medications:  .  benzonatate (TESSALON) 100 MG capsule, Take 1 capsule (100 mg total) by mouth 3 (three) times daily as needed for cough., Disp: 20 capsule, Rfl: 0 .  Calcium Carb-Cholecalciferol (CALCIUM 600 + D PO), Take 1 tablet by mouth. Three times a week, Disp: , Rfl:  .  calcium carbonate (TUMS EX) 750 MG chewable tablet, Chew 750-1,500 mg by mouth daily as needed for heartburn., Disp: , Rfl:  .  docusate sodium (COLACE) 100 MG capsule, Take 2 capsules (200 mg total) by mouth 2 (two) times daily., Disp: 120 capsule, Rfl: 3 .  dutasteride (AVODART) 0.5 MG capsule, Take 0.5 mg by mouth every morning. , Disp: ,  Rfl:  .  fexofenadine (ALLEGRA) 180 MG tablet, Take 180 mg by mouth every morning. , Disp: , Rfl:  .  meloxicam (MOBIC) 15 MG tablet, Take 1 tablet (15 mg total) by mouth daily., Disp: 14 tablet, Rfl: 2 .  methocarbamol (ROBAXIN) 500 MG tablet, Take 2 tablets (1,000 mg total) by mouth every 8 (eight) hours as needed for muscle spasms., Disp: 60 tablet, Rfl: 2 .  Omega-3 Fatty Acids (FISH OIL ULTRA) 1400 MG CAPS, Take 1,400 mg by mouth daily., Disp: , Rfl:  .  vitamin B-12 (CYANOCOBALAMIN) 1000 MCG tablet, Take 1,000 mcg by mouth daily. , Disp: , Rfl:  .  Vitamin E 400 units TABS, Take 400 Units by mouth daily. , Disp: , Rfl:  .  Zinc 25 MG TABS, Take 25 mg by mouth daily. , Disp: , Rfl:   Review of Systems  Constitutional: Negative for appetite change, chills and fever.  Respiratory: Negative for chest tightness, shortness of breath and wheezing.   Cardiovascular: Negative for chest pain and palpitations.  Gastrointestinal: Positive for abdominal pain. Negative for nausea and vomiting.    Social History   Tobacco Use  . Smoking status: Former Smoker    Packs/day: 2.00    Years: 15.00    Pack years: 30.00    Quit date: 05/30/1980    Years since quitting: 39.0  . Smokeless tobacco: Never Used  . Tobacco comment: remote smoking history, quit in 1981  Substance  Use Topics  . Alcohol use: Yes    Alcohol/week: 1.0 - 3.0 standard drinks    Types: 1 - 3 Standard drinks or equivalent per week    Comment: occ beer or wine      Objective:     Physical Exam  Telephone visit. Awake alert, oriented x 3.       Assessment & Plan    1. Abdominal wall pain Unable to examine due to this being telephone visit, but history is not consistent with any intraabdominal. Suspect trapped nerve, possible originating from abdominal wall strain after doing yard work. Aggravated by coughing as below.  Try  - predniSONE (DELTASONE) 20 MG tablet; Take 1 tablet (20 mg total) by mouth 2 (two) times  daily with a meal for 7 days.  Dispense: 14 tablet; Refill: 0 - gabapentin (NEURONTIN) 300 MG capsule; Take one at bedtime for 4 days, then take one twice daily  Dispense: 60 capsule; Refill: 0  2. Cough  - predniSONE (DELTASONE) 20 MG tablet; Take 1 tablet (20 mg total) by mouth 2 (two) times daily with a meal for 7 days.  Dispense: 14 tablet; Refill: 0 - benzonatate (TESSALON) 100 MG capsule; Take 1 capsule (100 mg total) by mouth 3 (three) times daily as needed for cough.  Dispense: 20 capsule; Refill: 0  Call if symptoms change or if not rapidly improving.    I discussed the assessment and treatment plan with the patient. The patient was provided an opportunity to ask questions and all were answered. The patient agreed with the plan and demonstrated an understanding of the instructions.   The patient was advised to call back or seek an in-person evaluation if the symptoms worsen or if the condition fails to improve as anticipated.  I provided 11 minutes of non-face-to-face time during this encounter.    The entirety of the information documented in the History of Present Illness, Review of Systems and Physical Exam were personally obtained by me. Portions of this information were initially documented by Meyer Cory, CMA and reviewed by me for thoroughness and accuracy.      Lelon Huh, MD  [facility]

## 2019-06-10 NOTE — Telephone Encounter (Signed)
Copied from [place] [id number]. Topic: Appointment Scheduling - Scheduling Inquiry for Clinic >> Jun 10, 2019  8:42 AM Alexander Moss wrote: Reason for CRM: pt is having covid 19 test today and had a virtual appt on 06-03-2019 for a cough with chrismon. Pt would like follow up on abd pain with dr Caryn Section if possible. Pt is aware dr Caryn Section is unavailable this week

## 2019-06-19 ENCOUNTER — Ambulatory Visit: Payer: Self-pay | Admitting: *Deleted

## 2019-06-19 NOTE — Telephone Encounter (Signed)
Contacted pt due to concerns of ongoing abdominal pain for the past 2 months; the pt states symptoms have worsened back to where he started; he had multiple visits related to this but was last seen in office 06/10/2019; Gabapentin had helped but starting 06/18/2019 he started using a crutch;he sees Dr Caryn Section, [place]; per Dr Maralyn Sago note from 06/10/2019, The patient was advised to call back or seek an in-person evaluation if the symptoms worsen or if the condition fails to improve as anticipated."; he is calling back to schedule an appt; pt transferred to [facility] for scheduling.  Reason for Disposition . Requesting regular office appointment  Answer Assessment - Initial Assessment Questions 1. REASON FOR CALL or QUESTION: "What is your reason for calling today?" or "How can I best help you?" or "What question do you have that I can help answer?"     Pt would like appointment for ongoing abdominal pain  Protocols used: [place]

## 2019-06-20 ENCOUNTER — Ambulatory Visit: Payer: Self-pay | Admitting: Adult Health

## 2019-06-24 ENCOUNTER — Ambulatory Visit
Admission: RE | Admit: 2019-06-24 | Discharge: 2019-06-24 | Disposition: A | Discharge status: Home / Self Care | Payer: PPO | Source: Ambulatory Visit | Attending: Adult Health | Admitting: Adult Health

## 2019-06-24 ENCOUNTER — Ambulatory Visit (INDEPENDENT_AMBULATORY_CARE_PROVIDER_SITE_OTHER): Payer: PPO | Admitting: Adult Health

## 2019-06-24 ENCOUNTER — Other Ambulatory Visit: Payer: Self-pay

## 2019-06-24 ENCOUNTER — Encounter: Payer: Self-pay | Admitting: Adult Health

## 2019-06-24 ENCOUNTER — Telehealth: Payer: Self-pay

## 2019-06-24 VITALS — BP 120/78 | HR 61 | Temp 96.9°F | Wt 230.0 lb

## 2019-06-24 DIAGNOSIS — M47816 Spondylosis without myelopathy or radiculopathy, lumbar region: Secondary | ICD-10-CM | POA: Diagnosis not present

## 2019-06-24 DIAGNOSIS — M25561 Pain in right knee: Secondary | ICD-10-CM | POA: Insufficient documentation

## 2019-06-24 DIAGNOSIS — M25562 Pain in left knee: Secondary | ICD-10-CM | POA: Insufficient documentation

## 2019-06-24 DIAGNOSIS — M79605 Pain in left leg: Secondary | ICD-10-CM

## 2019-06-24 DIAGNOSIS — M79604 Pain in right leg: Secondary | ICD-10-CM

## 2019-06-24 DIAGNOSIS — E669 Obesity, unspecified: Secondary | ICD-10-CM | POA: Diagnosis not present

## 2019-06-24 DIAGNOSIS — R1030 Lower abdominal pain, unspecified: Secondary | ICD-10-CM

## 2019-06-24 DIAGNOSIS — N390 Urinary tract infection, site not specified: Secondary | ICD-10-CM

## 2019-06-24 DIAGNOSIS — R319 Hematuria, unspecified: Secondary | ICD-10-CM

## 2019-06-24 DIAGNOSIS — R102 Pelvic and perineal pain: Secondary | ICD-10-CM | POA: Diagnosis not present

## 2019-06-24 DIAGNOSIS — G8929 Other chronic pain: Secondary | ICD-10-CM | POA: Diagnosis not present

## 2019-06-24 DIAGNOSIS — N401 Enlarged prostate with lower urinary tract symptoms: Secondary | ICD-10-CM | POA: Diagnosis not present

## 2019-06-24 DIAGNOSIS — M5136 Other intervertebral disc degeneration, lumbar region: Secondary | ICD-10-CM

## 2019-06-24 DIAGNOSIS — M545 Low back pain, unspecified: Secondary | ICD-10-CM

## 2019-06-24 DIAGNOSIS — K5900 Constipation, unspecified: Secondary | ICD-10-CM | POA: Diagnosis not present

## 2019-06-24 DIAGNOSIS — R109 Unspecified abdominal pain: Secondary | ICD-10-CM | POA: Diagnosis not present

## 2019-06-24 DIAGNOSIS — R972 Elevated prostate specific antigen [PSA]: Secondary | ICD-10-CM | POA: Diagnosis not present

## 2019-06-24 LAB — POCT URINALYSIS DIPSTICK
Bilirubin, UA: NEGATIVE
Glucose, UA: NEGATIVE
Ketones, UA: NEGATIVE
Nitrite, UA: NEGATIVE
Protein, UA: NEGATIVE
Spec Grav, UA: 1.02 (ref 1.010–1.025)
Urobilinogen, UA: 0.2 E.U./dL
pH, UA: 6 (ref 5.0–8.0)

## 2019-06-24 MED ORDER — SULFAMETHOXAZOLE-TRIMETHOPRIM 800-160 MG PO TABS: 1.0000 | ORAL_TABLET | Freq: Two times a day (BID) | ORAL | 0 refills | Status: DC

## 2019-06-24 NOTE — Telephone Encounter (Signed)
Copied from [place] [id number]. Topic: Referral - Question >> Jun 24, 2019  3:28 PM Percell Belt A wrote: Reason for CRM: pt called in and stated that the GI Dr that he was referred to can not get him in til Aug 15 2019 and would like to know if that is to late or does he need to be referred somewhere that can get him in sooner?   Best number  [phone]

## 2019-06-24 NOTE — Patient Instructions (Signed)
Urinary Tract Infection, Adult A urinary tract infection (UTI) is an infection of any part of the urinary tract. The urinary tract includes:  The kidneys.  The ureters.  The bladder.  The urethra. These organs make, store, and get rid of pee (urine) in the body. What are the causes? This is caused by germs (bacteria) in your genital area. These germs grow and cause swelling (inflammation) of your urinary tract. What increases the risk? You are more likely to develop this condition if:  You have a small, thin tube (catheter) to drain pee.  You cannot control when you pee or poop (incontinence).  You are male, and: ? You use these methods to prevent pregnancy: ? A medicine that kills sperm (spermicide). ? A device that blocks sperm (diaphragm). ? You have low levels of a male hormone (estrogen). ? You are pregnant.  You have genes that add to your risk.  You are sexually active.  You take antibiotic medicines.  You have trouble peeing because of: ? A prostate that is bigger than normal, if you are male. ? A blockage in the part of your body that drains pee from the bladder (urethra). ? A kidney stone. ? A nerve condition that affects your bladder (neurogenic bladder). ? Not getting enough to drink. ? Not peeing often enough.  You have other conditions, such as: ? Diabetes. ? A weak disease-fighting system (immune system). ? Sickle cell disease. ? Gout. ? Injury of the spine. What are the signs or symptoms? Symptoms of this condition include:  Needing to pee right away (urgently).  Peeing often.  Peeing small amounts often.  Pain or burning when peeing.  Blood in the pee.  Pee that smells bad or not like normal.  Trouble peeing.  Pee that is cloudy.  Fluid coming from the vagina, if you are male.  Pain in the belly or lower back. Other symptoms include:  Throwing up (vomiting).  No urge to eat.  Feeling mixed up (confused).  Being tired  and grouchy (irritable).  A fever.  Watery poop (diarrhea). How is this treated? This condition may be treated with:  Antibiotic medicine.  Other medicines.  Drinking enough water. Follow these instructions at home:  Medicines  Take over-the-counter and prescription medicines only as told by your doctor.  If you were prescribed an antibiotic medicine, take it as told by your doctor. Do not stop taking it even if you start to feel better. General instructions  Make sure you: ? Pee until your bladder is empty. ? Do not hold pee for a long time. ? Empty your bladder after sex. ? Wipe from front to back after pooping if you are a male. Use each tissue one time when you wipe.  Drink enough fluid to keep your pee pale yellow.  Keep all follow-up visits as told by your doctor. This is important. Contact a doctor if:  You do not get better after 1-2 days.  Your symptoms go away and then come back. Get help right away if:  You have very bad back pain.  You have very bad pain in your lower belly.  You have a fever.  You are sick to your stomach (nauseous).  You are throwing up. Summary  A urinary tract infection (UTI) is an infection of any part of the urinary tract.  This condition is caused by germs in your genital area.  There are many risk factors for a UTI. These include having a small, thin   tube to drain pee and not being able to control when you pee or poop.  Treatment includes antibiotic medicines for germs.  Drink enough fluid to keep your pee pale yellow. This information is not intended to replace advice given to you by your health care provider. Make sure you discuss any questions you have with your health care provider. Document Released: 12/14/2007 Document Revised: 06/14/2018 Document Reviewed: 01/04/2018 Elsevier Patient Education  [place]. Sulfamethoxazole; Trimethoprim, SMX-TMP tablets What is this medicine? SULFAMETHOXAZOLE;  TRIMETHOPRIM or SMX-TMP (suhl fuh meth OK suh zohl; trye METH oh prim) is a combination of a sulfonamide antibiotic and a second antibiotic, trimethoprim. It is used to treat or prevent certain kinds of bacterial infections. It will not work for colds, flu, or other viral infections. This medicine may be used for other purposes; ask your health care provider or pharmacist if you have questions. COMMON BRAND NAME(S): Bacter-Aid DS, Bactrim, Bactrim DS, Septra, Septra DS What should I tell my health care provider before I take this medicine? They need to know if you have any of these conditions:  anemia  asthma  being treated with anticonvulsants  if you frequently drink alcohol containing drinks  kidney disease  liver disease  low level of folic acid or [id number] dehydrogenase  poor nutrition or malabsorption  porphyria  severe allergies  thyroid disorder  an unusual or allergic reaction to sulfamethoxazole, trimethoprim, sulfa drugs, other medicines, foods, dyes, or preservatives  pregnant or trying to get pregnant  breast-feeding How should I use this medicine? Take this medicine by mouth with a full glass of water. Follow the directions on the prescription label. Take your medicine at regular intervals. Do not take it more often than directed. Do not skip doses or stop your medicine early. Talk to your pediatrician regarding the use of this medicine in children. Special care may be needed. This medicine has been used in children as young as 26 months of age. Overdosage: If you think you have taken too much of this medicine contact a poison control center or emergency room at once. NOTE: This medicine is only for you. Do not share this medicine with others. What if I miss a dose? If you miss a dose, take it as soon as you can. If it is almost time for your next dose, take only that dose. Do not take double or extra doses. What may interact with this medicine? Do not  take this medicine with any of the following medications:  aminobenzoate potassium  dofetilide  metronidazole This medicine may also interact with the following medications:  ACE inhibitors like benazepril, enalapril, lisinopril, and ramipril  birth control pills  cyclosporine  digoxin  diuretics  indomethacin  medicines for diabetes  methenamine  methotrexate  phenytoin  potassium supplements  pyrimethamine  sulfinpyrazone  tricyclic antidepressants  warfarin This list may not describe all possible interactions. Give your health care provider a list of all the medicines, herbs, non-prescription drugs, or dietary supplements you use. Also tell them if you smoke, drink alcohol, or use illegal drugs. Some items may interact with your medicine. What should I watch for while using this medicine? Tell your doctor or health care professional if your symptoms do not improve. Drink several glasses of water a day to reduce the risk of kidney problems. Do not treat diarrhea with over the counter products. Contact your doctor if you have diarrhea that lasts more than 2 days or if it is severe and watery.  This medicine can make you more sensitive to the sun. Keep out of the sun. If you cannot avoid being in the sun, wear protective clothing and use a sunscreen. Do not use sun lamps or tanning beds/booths. What side effects may I notice from receiving this medicine? Side effects that you should report to your doctor or health care professional as soon as possible:  allergic reactions like skin rash or hives, swelling of the face, lips, or tongue  breathing problems  fever or chills, sore throat  irregular heartbeat, chest pain  joint or muscle pain  pain or difficulty passing urine  red pinpoint spots on skin  redness, blistering, peeling or loosening of the skin, including inside the mouth  unusual bleeding or bruising  unusually weak or tired  yellowing of the  eyes or skin Side effects that usually do not require medical attention (report to your doctor or health care professional if they continue or are bothersome):  diarrhea  dizziness  headache  loss of appetite  nausea, vomiting  nervousness This list may not describe all possible side effects. Call your doctor for medical advice about side effects. You may report side effects to FDA at 1-800-FDA-1088. Where should I keep my medicine? Keep out of the reach of children. Store at room temperature between 20 to 25 degrees C (68 to 77 degrees F). Protect from light. Throw away any unused medicine after the expiration date. NOTE: This sheet is a summary. It may not cover all possible information. If you have questions about this medicine, talk to your doctor, pharmacist, or health care provider.  2020 Elsevier/Gold Standard (2013-02-01 14:38:26) Low Back Sprain or Strain Rehab Ask your health care provider which exercises are safe for you. Do exercises exactly as told by your health care provider and adjust them as directed. It is normal to feel mild stretching, pulling, tightness, or discomfort as you do these exercises. Stop right away if you feel sudden pain or your pain gets worse. Do not begin these exercises until told by your health care provider. Stretching and range-of-motion exercises These exercises warm up your muscles and joints and improve the movement and flexibility of your back. These exercises also help to relieve pain, numbness, and tingling. Lumbar rotation  1. Lie on your back on a firm surface and bend your knees. 2. Straighten your arms out to your sides so each arm forms a 90-degree angle (right angle) with a side of your body. 3. Slowly move (rotate) both of your knees to one side of your body until you feel a stretch in your lower back (lumbar). Try not to let your shoulders lift off the floor. 4. Hold this position for __________ seconds. 5. Tense your abdominal  muscles and slowly move your knees back to the starting position. 6. Repeat this exercise on the other side of your body. Repeat __________ times. Complete this exercise __________ times a day. Single knee to chest  1. Lie on your back on a firm surface with both legs straight. 2. Bend one of your knees. Use your hands to move your knee up toward your chest until you feel a gentle stretch in your lower back and buttock. ? Hold your leg in this position by holding on to the front of your knee. ? Keep your other leg as straight as possible. 3. Hold this position for __________ seconds. 4. Slowly return to the starting position. 5. Repeat with your other leg. Repeat __________ times. Complete this  exercise __________ times a day. Prone extension on elbows  1. Lie on your abdomen on a firm surface (prone position). 2. Prop yourself up on your elbows. 3. Use your arms to help lift your chest up until you feel a gentle stretch in your abdomen and your lower back. ? This will place some of your body weight on your elbows. If this is uncomfortable, try stacking pillows under your chest. ? Your hips should stay down, against the surface that you are lying on. Keep your hip and back muscles relaxed. 4. Hold this position for __________ seconds. 5. Slowly relax your upper body and return to the starting position. Repeat __________ times. Complete this exercise __________ times a day. Strengthening exercises These exercises build strength and endurance in your back. Endurance is the ability to use your muscles for a long time, even after they get tired. Pelvic tilt This exercise strengthens the muscles that lie deep in the abdomen. 1. Lie on your back on a firm surface. Bend your knees and keep your feet flat on the floor. 2. Tense your abdominal muscles. Tip your pelvis up toward the ceiling and flatten your lower back into the floor. ? To help with this exercise, you may place a small towel under  your lower back and try to push your back into the towel. 3. Hold this position for __________ seconds. 4. Let your muscles relax completely before you repeat this exercise. Repeat __________ times. Complete this exercise __________ times a day. Alternating arm and leg raises  1. Get on your hands and knees on a firm surface. If you are on a hard floor, you may want to use padding, such as an exercise mat, to cushion your knees. 2. Line up your arms and legs. Your hands should be directly below your shoulders, and your knees should be directly below your hips. 3. Lift your left leg behind you. At the same time, raise your right arm and straighten it in front of you. ? Do not lift your leg higher than your hip. ? Do not lift your arm higher than your shoulder. ? Keep your abdominal and back muscles tight. ? Keep your hips facing the ground. ? Do not arch your back. ? Keep your balance carefully, and do not hold your breath. 4. Hold this position for __________ seconds. 5. Slowly return to the starting position. 6. Repeat with your right leg and your left arm. Repeat __________ times. Complete this exercise __________ times a day. Abdominal set with straight leg raise  1. Lie on your back on a firm surface. 2. Bend one of your knees and keep your other leg straight. 3. Tense your abdominal muscles and lift your straight leg up, 4-6 inches (10-15 cm) off the ground. 4. Keep your abdominal muscles tight and hold this position for __________ seconds. ? Do not hold your breath. ? Do not arch your back. Keep it flat against the ground. 5. Keep your abdominal muscles tense as you slowly lower your leg back to the starting position. 6. Repeat with your other leg. Repeat __________ times. Complete this exercise __________ times a day. Single leg lower with bent knees 1. Lie on your back on a firm surface. 2. Tense your abdominal muscles and lift your feet off the floor, one foot at a time, so  your knees and hips are bent in 90-degree angles (right angles). ? Your knees should be over your hips and your lower legs should be parallel to  the floor. 3. Keeping your abdominal muscles tense and your knee bent, slowly lower one of your legs so your toe touches the ground. 4. Lift your leg back up to return to the starting position. ? Do not hold your breath. ? Do not let your back arch. Keep your back flat against the ground. 5. Repeat with your other leg. Repeat __________ times. Complete this exercise __________ times a day. Posture and body mechanics Good posture and healthy body mechanics can help to relieve stress in your body's tissues and joints. Body mechanics refers to the movements and positions of your body while you do your daily activities. Posture is part of body mechanics. Good posture means:  Your spine is in its natural S-curve position (neutral).  Your shoulders are pulled back slightly.  Your head is not tipped forward. Follow these guidelines to improve your posture and body mechanics in your everyday activities. Standing   When standing, keep your spine neutral and your feet about hip width apart. Keep a slight bend in your knees. Your ears, shoulders, and hips should line up.  When you do a task in which you stand in one place for a long time, place one foot up on a stable object that is 2-4 inches (5-10 cm) high, such as a footstool. This helps keep your spine neutral. Sitting   When sitting, keep your spine neutral and keep your feet flat on the floor. Use a footrest, if necessary, and keep your thighs parallel to the floor. Avoid rounding your shoulders, and avoid tilting your head forward.  When working at a desk or a computer, keep your desk at a height where your hands are slightly lower than your elbows. Slide your chair under your desk so you are close enough to maintain good posture.  When working at a computer, place your monitor at a height where  you are looking straight ahead and you do not have to tilt your head forward or downward to look at the screen. Resting  When lying down and resting, avoid positions that are most painful for you.  If you have pain with activities such as sitting, bending, stooping, or squatting, lie in a position in which your body does not bend very much. For example, avoid curling up on your side with your arms and knees near your chest (fetal position).  If you have pain with activities such as standing for a long time or reaching with your arms, lie with your spine in a neutral position and bend your knees slightly. Try the following positions: ? Lying on your side with a pillow between your knees. ? Lying on your back with a pillow under your knees. Lifting   When lifting objects, keep your feet at least shoulder width apart and tighten your abdominal muscles.  Bend your knees and hips and keep your spine neutral. It is important to lift using the strength of your legs, not your back. Do not lock your knees straight out.  Always ask for help to lift heavy or awkward objects. This information is not intended to replace advice given to you by your health care provider. Make sure you discuss any questions you have with your health care provider. Document Released: 06/27/2005 Document Revised: 10/19/2018 Document Reviewed: 07/19/2018 Elsevier Patient Education  [place]. Constipation, Adult Constipation is when a person:  Poops (has a bowel movement) fewer times in a week than normal.  Has a hard time pooping.  Has poop that  is dry, hard, or bigger than normal. Follow these instructions at home: Eating and drinking   Eat foods that have a lot of fiber, such as: ? Fresh fruits and vegetables. ? Whole grains. ? Beans.  Eat less of foods that are high in fat, low in fiber, or overly processed, such as: ? [place] fries. ? Hamburgers. ? Cookies. ? Candy. ? Soda.  Drink enough fluid  to keep your pee (urine) clear or pale yellow. General instructions  Exercise regularly or as told by your doctor.  Go to the restroom when you feel like you need to poop. Do not hold it in.  Take over-the-counter and prescription medicines only as told by your doctor. These include any fiber supplements.  Do pelvic floor retraining exercises, such as: ? Doing deep breathing while relaxing your lower belly (abdomen). ? Relaxing your pelvic floor while pooping.  Watch your condition for any changes.  Keep all follow-up visits as told by your doctor. This is important. Contact a doctor if:  You have pain that gets worse.  You have a fever.  You have not pooped for 4 days.  You throw up (vomit).  You are not hungry.  You lose weight.  You are bleeding from the anus.  You have thin, pencil-like poop (stool). Get help right away if:  You have a fever, and your symptoms suddenly get worse.  You leak poop or have blood in your poop.  Your belly feels hard or bigger than normal (is bloated).  You have very bad belly pain.  You feel dizzy or you faint. This information is not intended to replace advice given to you by your health care provider. Make sure you discuss any questions you have with your health care provider. Document Released: 12/14/2007 Document Revised: 06/09/2017 Document Reviewed: 12/16/2015 Elsevier Patient Education  2020 [employer].

## 2019-06-24 NOTE — Progress Notes (Addendum)
Alexander Moss  MRN: [id number] DOB: 1946/03/16  Subjective:  HPI   The patient is a 73 year old male who presents for evaluation of continued abdominal pain.  He states that the pain started about 2.5 months ago.  He was seen by Vernie Murders who thought it to be muscular and treated him Robaxin.  He seemed to get a little better but then the pain got worse.  He was then treated by Dr Caryn Section who thought it to be nerve pain and gave him Gabapentin.  This improved the pain for a period of time but then it got worse again.  Right now he reports it is stable but not normal.  He states that he is unable to walk without the use of a cane and he is able to get up more than in the past when he saw Dr. Caryn Section most recently.He did not use a cane prior he reports.   He previously had a cough that has since resolved.   It is of note that the patient had laser surgery for his prostate in August.  He has seen his urologist during these last 2 months and had CT done.  The urologist does not think this is coming from the surgery and reported that the CT was normal per patient and bladder thickening was thought to be from previous TURP procedure,  He denies any unusual urinary issues, since his procedure he still has mild hematuria which he reports he was told by urology was normal.  He had prostate surgery in Dr. Rogers Blocker end of August- enlarged not  Cancerous he reports TURP performed. He continue to have urinary symptoms as expected. Has some burning, frequency and hesitancy  He reports he has bowel movements that have beehn constipation the past few weeks, he has been using colace. He reports he had been consitpated for a couple of days and gained relief with colace.  He is usually very regular with bowel movements,Denies any  dark color or blood in bowel  Movements.   Lower back Always " aches with arthritis".History of chronic back pain. No change. No loss of bowel or bladder control, No saddle  paresthesia- though he does report pain at times radiates into his right groin area intermittently and does not last. He denies any injury or falls. He reports while he was on the prednisone his back and or legs did not hurt.  He saw Dr. Rogers Blocker for follow up  this morning and he felt pain across his lower abdomen/ pelvis was unrelated. He declines exam today at this visit of  testicular groin as he just saw urology this morning and reports all was a fine.  Pelvic pain with lifting his legs. He reports this leg pain varies and is not all the time just occasional.l  He has always had lower back issues with degenerative disc - denies any change. Denies ever having leg pain. He denies numbness, only pain.   He denies any testicular pain/penile pain or lesions. He reports Dr. Rogers Blocker examined.  .  Procedure Component Value Ref Range Date/Time  CT ABDOMEN PELVIS W WO CONTRAST [id number] Resulted: 04/24/19 1258  Order Status: Completed Updated: 04/24/19 1301  Narrative:   CLINICAL DATA: Severe pelvic pain/pressure and gross hematuria  after recent laser TURP.   EXAM:  CT ABDOMEN AND PELVIS WITHOUT AND WITH CONTRAST   TECHNIQUE:  Multidetector CT imaging of the abdomen and pelvis was performed  following the standard protocol before and following  the bolus  administration of intravenous contrast.   CONTRAST: 148mL OMNIPAQUE IOHEXOL 300 MG/ML SOLN   COMPARISON: None.   FINDINGS:  Lower chest: Left lower lobe nodules measure up to 4 mm,  nonspecific. Heart size normal. No pericardial or pleural effusion.  Distal esophageal wall thickening can be seen with gastroesophageal  reflux.   Hepatobiliary: 8 mm low-attenuation lesion in the left hepatic lobe  is too small to characterize. In the absence of known malignancy, a  cyst or hemangioma is likely. Liver and gallbladder are otherwise  unremarkable. No biliary ductal dilatation.   Pancreas: Negative.   Spleen: Negative.    Adrenals/Urinary Tract: Adrenal glands and right kidney are  unremarkable. 3.0 cm fluid density lesion in the interpolar left  kidney is indicative of a cyst. Left kidney is otherwise  unremarkable. Distal right ureter is poorly opacified, limiting  evaluation. Otherwise, no filling defects in the opacified portions  of the intrarenal collecting systems and visualized portions of the  ureters. Bladder is very thick-walled with irregularity inferiorly,  presumably related to recent trans urethral resection of the  prostate.   Stomach/Bowel: Stomach, small bowel, appendix and colon are  unremarkable.   Vascular/Lymphatic: Atherosclerotic calcification of the aorta  without aneurysm. No pathologically enlarged lymph nodes.   Reproductive: Prostatectomy.   Other: No free fluid. Mesenteries and peritoneum are unremarkable.   Musculoskeletal: Degenerative changes in the spine. No worrisome  lytic or sclerotic lesions.   IMPRESSION:  1. Marked bladder wall thickening with extensive irregularity along  the inferior margin of the bladder, presumably related to recent  laser TURP. Please correlate clinically.  2. Distal right ureter is poorly opacified. No additional findings  to explain the patient's symptoms.  3. Aortic atherosclerosis (ICD10-170.0).    Electronically Signed  By: Lorin Picket M.D.  On: 04/24/2019 12:56    Procedure Component Value Ref Range Date/Time  Creatinine, serum [id number] (Abnormal) Collected: 04/24/19 1043  Order Status: Completed Specimen: Blood Updated: 04/24/19 1056   Creatinine, Ser 1.28High  0.61 - 1.24 mg/dL    GFR calc non Af Amer 54Low  >60 mL/min    GFR calc Af Amer >60 >60 mL/min    Comment: Performed at [facility], [address], [place], [place] [address]        Patient Active Problem List   Diagnosis Date Noted  . Bilateral knee pain 06/24/2019  . Non-recurrent unilateral inguinal hernia without  obstruction or gangrene 08/29/2018  . Osteoarthritis of left knee 05/23/2016  . Hearing loss 01/06/2015  . Chronic back pain 12/09/2014  . GERD (gastroesophageal reflux disease) 12/09/2014  . Obesity 12/09/2014  . PSA Elevation 12/09/2014  . Rosacea 12/09/2014  . Family history of malignant neoplasm of gastrointestinal tract 07/09/2008  . Lumbago 06/25/2007  . Low back pain 06/25/2007  . Diverticulosis of colon without hemorrhage 09/08/2006    Past Medical History:  Diagnosis Date  . Arthritis   . GERD (gastroesophageal reflux disease)   . Hyperlipidemia   . Left inguinal hernia 08/2018    Social History   Socioeconomic History  . Marital status: Married    Spouse name: Not on file  . Number of children: 1  . Years of education: [name]  . Highest education level: Bachelor's degree (e.g., BA, AB, BS)  Occupational History  . Occupation: Retired  Tobacco Use  . Smoking status: Former Smoker    Packs/day: 2.00    Years: 15.00    Pack years: 30.00  Quit date: 05/30/1980    Years since quitting: 39.0  . Smokeless tobacco: Never Used  . Tobacco comment: remote smoking history, quit in 1981  Substance and Sexual Activity  . Alcohol use: Yes    Alcohol/week: 1.0 - 3.0 standard drinks    Types: 1 - 3 Standard drinks or equivalent per week    Comment: occ beer or wine  . Drug use: No  . Sexual activity: Not on file  Other Topics Concern  . Not on file  Social History Narrative  . Not on file   Social Determinants of Health   Financial Resource Strain:   . Difficulty of Paying Living Expenses: Not on file  Food Insecurity:   . Worried About Charity fundraiser in the Last Year: Not on file  . Ran Out of Food in the Last Year: Not on file  Transportation Needs:   . Lack of Transportation (Medical): Not on file  . Lack of Transportation (Non-Medical): Not on file  Physical Activity:   . Days of Exercise per Week: Not on file  . Minutes of Exercise per Session:  Not on file  Stress:   . Feeling of Stress : Not on file  Social Connections:   . Frequency of Communication with Friends and Family: Not on file  . Frequency of Social Gatherings with Friends and Family: Not on file  . Attends Religious Services: Not on file  . Active Member of Clubs or Organizations: Not on file  . Attends Archivist Meetings: Not on file  . Marital Status: Not on file  Intimate Partner Violence:   . Fear of Current or Ex-Partner: Not on file  . Emotionally Abused: Not on file  . Physically Abused: Not on file  . Sexually Abused: Not on file    Outpatient Encounter Medications as of 06/24/2019  Medication Sig  . calcium carbonate (TUMS EX) 750 MG chewable tablet Chew 750-1,500 mg by mouth daily as needed for heartburn.  . docusate sodium (COLACE) 100 MG capsule Take 2 capsules (200 mg total) by mouth 2 (two) times daily.  Marland Kitchen dutasteride (AVODART) 0.5 MG capsule Take 0.5 mg by mouth every morning.   . fexofenadine (ALLEGRA) 180 MG tablet Take 180 mg by mouth every morning.   . gabapentin (NEURONTIN) 300 MG capsule Take one at bedtime for 4 days, then take one twice daily  . Omega-3 Fatty Acids (FISH OIL ULTRA) 1400 MG CAPS Take 1,400 mg by mouth daily.  . vitamin B-12 (CYANOCOBALAMIN) 1000 MCG tablet Take 1,000 mcg by mouth daily.   . Vitamin E 400 units TABS Take 400 Units by mouth daily.   . Zinc 25 MG TABS Take 25 mg by mouth daily.   . benzonatate (TESSALON) 100 MG capsule Take 1 capsule (100 mg total) by mouth 3 (three) times daily as needed for cough. (Patient not taking: Reported on 06/24/2019)  . FLUZONE HIGH-DOSE QUADRIVALENT 0.7 ML SUSY   . meloxicam (MOBIC) 15 MG tablet Take 1 tablet (15 mg total) by mouth daily. (Patient not taking: Reported on 06/24/2019)  . methocarbamol (ROBAXIN) 500 MG tablet Take 2 tablets (1,000 mg total) by mouth every 8 (eight) hours as needed for muscle spasms. (Patient not taking: Reported on 06/24/2019)  . Marland Kitchen  [DISCONTINUED] Calcium Carb-Cholecalciferol (CALCIUM 600 + D PO) Take 1 tablet by mouth. Three times a week  . [DISCONTINUED] levofloxacin (LEVAQUIN) 500 MG tablet TK 1 T PO D  . [DISCONTINUED] nitrofurantoin, macrocrystal-monohydrate, (MACROBID)  100 MG capsule TK 1 C PO Q 12 H FOR 5 DAYS UTD   No facility-administered encounter medications on file as of 06/24/2019.    No Known Allergies  Review of Systems  Constitutional: Negative for chills, diaphoresis, fever and malaise/fatigue.  HENT: Negative for congestion, ear pain, sinus pain and sore throat.   Respiratory: Negative for cough and shortness of breath.   Cardiovascular: Negative for chest pain.  Gastrointestinal: Positive for abdominal pain and constipation (yes over the past 4 days with the pain medicatinos from surgery. ). Negative for blood in stool, diarrhea, heartburn, melena, nausea and vomiting.  Genitourinary: Positive for dysuria, frequency and hematuria (mild since TURP). Negative for flank pain and urgency.       At times since TURP with Dr. Rogers Blocker   Musculoskeletal: Positive for back pain (chronic sine age 49 ) and myalgias (abdomien). Negative for joint pain and neck pain.  Skin: Negative.   Neurological: Negative for dizziness and headaches.    Objective:  BP 120/78 (BP Location: Right Arm, Patient Position: Sitting, Cuff Size: Normal)   Pulse 61   Temp (!) 96.9 F (36.1 C) (Skin)   Wt 230 lb (104.3 kg)   SpO2 99%   BMI 35.49 kg/m   Physical Exam  Constitutional: He is oriented to person, place, and time and well-developed, well-nourished, and in no distress. No distress.  HENT:  Head: Normocephalic and atraumatic.  Right Ear: External ear normal.  Left Ear: External ear normal.  Eyes: Pupils are equal, round, and reactive to light. Conjunctivae are normal.  Neck: No JVD present.  Cardiovascular: Normal rate, regular rhythm and normal heart sounds. Exam reveals no gallop and no friction rub.  No murmur  heard. Pulmonary/Chest: Effort normal and breath sounds normal. No stridor. No respiratory distress. He has no wheezes. He has no rales. He exhibits no tenderness.  Abdominal: Soft. He exhibits no shifting dullness, no distension, no pulsatile liver, no abdominal bruit, no pulsatile midline mass and no mass. Bowel sounds are not absent. There is no hepatosplenomegaly, splenomegaly or hepatomegaly. There is abdominal tenderness (mild tenderness as marked on diagram, tenderness most notable mild suprapubic. ) in the suprapubic area. There is no rigidity, no rebound, no guarding, no CVA tenderness, no tenderness at McBurney's point and negative Murphy's sign. Hernia confirmed negative in the umbilical area.    Right sided to suprapubic tenderness worse than left- mild tenderness also noted on left. Where marked on lower abdomen diagram. No other tenderness with light or deep palpation.   No hernia.   Negative jump test.   Genitourinary:    Genitourinary Comments: Declined by patient/ he denies any concerns    Musculoskeletal:        General: No deformity or edema. Normal range of motion.     Right shoulder: Spasms present.       Arms:     Cervical back: Normal range of motion.     Lumbar back: No pain.       Back:     Right upper leg: Tenderness present. No swelling, edema or bony tenderness.     Left upper leg: Tenderness present. No swelling, edema or bony tenderness.       Legs:     Comments: Lower back Always " aches with arthritis". No change.   Lymphadenopathy:    He has no cervical adenopathy.       Right cervical: No superficial cervical, no deep cervical and no posterior cervical adenopathy present.  Left cervical: No superficial cervical, no deep cervical and no posterior cervical adenopathy present.       Right: Inguinal adenopathy: declined   Neurological: He is alert and oriented to person, place, and time. He displays normal reflexes. No cranial nerve deficit. Gait  normal. Coordination normal.  Skin: Skin is warm, dry and intact. No rash noted. He is not diaphoretic. No erythema. No pallor.  Psychiatric: Mood, memory, affect and judgment normal.  Vitals reviewed.   Assessment and Plan :   1. Pelvic pain in male Treating leukocytes in urine. Recommend follow back up with urology within 1-2 weeks sooner if symptoms persist.  Results for orders placed or performed in visit on 06/24/19 (from the past 24 hour(s))  POCT Urinalysis Dipstick     Status: Abnormal   Collection Time: 06/24/19 10:58 AM  Result Value Ref Range   Color, UA yellow    Clarity, UA clear    Glucose, UA Negative Negative   Bilirubin, UA neg    Ketones, UA neg    Spec Grav, UA 1.020 1.010 - 1.025   Blood, UA 3+    pH, UA 6.0 5.0 - 8.0   Protein, UA Negative Negative   Urobilinogen, UA 0.2 0.2 or 1.0 E.U./dL   Nitrite, UA neg    Leukocytes, UA Large (3+) (A) Negative   Appearance     Odor     Blood likely relate to TURP / bacteria not related to TURP.  Repeat clearing for urine blood and bacteria after finishing antibiotics.  - POCT Urinalysis Dipstick - DG Lumbar Spine Complete; Future - DG Abd 1 View; Future  2. Leg pain, bilateral I suspect lower back etiology given patients increased " weakness " in legs over the past few week, prednisone relieved the pain and weakness. Will obtain lumbar x rays.  Discussed red flags  Orders Placed This Encounter  Procedures  . DG Lumbar Spine Complete    Standing Status:   Future    Standing Expiration Date:   08/24/2020    Order Specific Question:   Reason for Exam (SYMPTOM  OR DIAGNOSIS REQUIRED)    Answer:   history degernerative back pain/ pain in right / left thigh intermittent. abdominal wall pain lower.    Order Specific Question:   Preferred imaging location?    Answer:   [facility]    Order Specific Question:   Radiology Contrast Protocol - do NOT remove file path    Answer:    \\[email or web address]\DXFluoroContrastProtocols.pdf  . DG Abd 1 View    Standing Status:   Future    Standing Expiration Date:   08/24/2020    Order Specific Question:   Reason for Exam (SYMPTOM  OR DIAGNOSIS REQUIRED)    Answer:   lower abdominal /wall pain/ history of consitipation /    Order Specific Question:   Preferred imaging location?    Answer:   [facility]    Order Specific Question:   Radiology Contrast Protocol - do NOT remove file path    Answer:   \\[email or web address]\[email or web address]\DXFluoroContrastProtocols.pdf  . POCT Urinalysis Dipstick    - POCT Urinalysis Dipstick - DG Lumbar Spine Complete; Future - DG Abd 1 View; Future  3. Urinary tract infection with hematuria, site unspecified Meds ordered this encounter  Medications  . sulfamethoxazole-trimethoprim (BACTRIM DS) 800-160 MG tablet    Sig: Take 1 tablet by mouth 2 (two) times daily.    Dispense:  20 tablet    Refill:  0  hematuria likely due to TURP- however 3 plus bacteria. He is advised to hydrate.   4. Recurrent lower abdominal pain Will check KUB. CT in October was relatively normal.If all normal will refer  To gastroenterology. He has already seen surgery and did not feel was related. Will rule out constipation or ureter stone. Since urology does not feel that this is related to TURP and ongoing without much reported improvement will have GI evaluation as well.   Orders Placed This Encounter  Procedures  . DG Lumbar Spine Complete  . DG Abd 1 View  . Comprehensive Metabolic Panel (CMET)  . CBC with Differential/Platelet [id number]  . POCT Urinalysis Dipstick    Return in about 1 week (around 07/01/2019). Advised patient call the office or your primary care doctor for an appointment if no improvement within 72 hours or if any symptoms change or worsen at any time  Advised ER or urgent Care if after hours or on weekend. Call 911 for emergency symptoms at any time.Patinet verbalized understanding  of all instructions given/reviewed and treatment plan and has no further questions or concerns at this time.       The entirety of the information documented in the History of Present Illness, Review of Systems and Physical Exam were personally obtained by me. Portions of this information were initially documented by the  Certified Medical Assistant whose name is documented in [place] and reviewed by me for thoroughness and accuracy.  I have personally performed the exam and reviewed the chart and it is accurate to the best of my knowledge.  Haematologist has been used and any errors in dictation or transcription are unintentional.  Kelby Aline. [facility]

## 2019-06-25 ENCOUNTER — Encounter: Payer: Self-pay | Admitting: Physician Assistant

## 2019-06-25 ENCOUNTER — Telehealth: Payer: Self-pay

## 2019-06-25 DIAGNOSIS — R1030 Lower abdominal pain, unspecified: Secondary | ICD-10-CM

## 2019-06-25 DIAGNOSIS — M79605 Pain in left leg: Secondary | ICD-10-CM

## 2019-06-25 DIAGNOSIS — R102 Pelvic and perineal pain: Secondary | ICD-10-CM

## 2019-06-25 DIAGNOSIS — M79604 Pain in right leg: Secondary | ICD-10-CM

## 2019-06-25 DIAGNOSIS — N390 Urinary tract infection, site not specified: Secondary | ICD-10-CM

## 2019-06-25 NOTE — Telephone Encounter (Signed)
-----   Message from Doreen Beam, [place] sent at 06/25/2019  8:31 AM EST ----- Please let patient know the following:  Flat plate of abdomen shows no constipation, no acute abnormality. It does show lumbar spine degenerative changes and mild scoliosis.   His lumbar spine x ray shows moderate lumbar and thoracic spine degenerative changes in his spine and mild scoliosis.   The degenerative disease could be causing some of his upper leg pain. Since this has been persistent I would like him to see [facility] - I can place a referral - let me know if he prefers [place] or [place].  Continue treatment for his urine infection and let me know if any symptoms are worsening.  As in regards to his wanting to see gastrointestinal doctor sooner I have sent in another referral to see if he can get an earlier appt.  I have placed an order for labs for CBC and CMET he should have a repeat done in the office since his last was kidney function and hemoglobin was abnormal in the hospital.  If he has any emergent symptoms as always emergency room is always an option.

## 2019-06-25 NOTE — Telephone Encounter (Signed)
Spoke with patient on the phone and advised as below he states that he would like for referral to be put in for [facility] in [place]. Patient states that [facility] called him yesterday and stated that the earliest appt would be 08/15/2019. KW

## 2019-06-25 NOTE — Telephone Encounter (Signed)
Ok I sent a referral to [facility] to see if he can be seen sooner. Referral sent to [facility]. He can walk in.  If his symptoms persist I feel he should follow back up with his urologist as well given his TURP. Emergency room for worsening emergency symptoms.

## 2019-06-25 NOTE — Addendum Note (Signed)
Addended by: Doreen Beam on: 06/25/2019 08:18 AM   Modules accepted: Orders

## 2019-06-25 NOTE — Progress Notes (Signed)
Please let patient know the following:  Flat plate of abdomen shows no constipation, no acute abnormality. It does show lumbar spine degenerative changes and mild scoliosis.   His lumbar spine x ray shows moderate lumbar and thoracic spine degenerative changes in his spine and mild scoliosis.   The degenerative disease could be causing some of his upper leg pain. Since this has been persistent I would like him to see [facility] - I can place a referral - let me know if he prefers [place] or [place].  Continue treatment for his urine infection and let me know if any symptoms are worsening.  As in regards to his wanting to see gastrointestinal doctor sooner I have sent in another referral to see if he can get an earlier appt.  I have placed an order for labs for CBC and CMET he should have a repeat done in the office since his last was kidney function and hemoglobin was abnormal in the hospital.  If he has any emergent symptoms as always emergency room is always an option.

## 2019-06-26 ENCOUNTER — Telehealth: Payer: Self-pay

## 2019-06-26 LAB — URINE CULTURE: Organism ID, Bacteria: NO GROWTH

## 2019-06-26 NOTE — Telephone Encounter (Signed)
Copied from [place] [id number]. Topic: General - Other >> Jun 26, 2019  8:46 AM Antonieta Iba C wrote: Reason for CRM: pt says that he is returning the office call, please assist pt further  CB: [phone]

## 2019-06-26 NOTE — Telephone Encounter (Signed)
Patient has been advised as below. KW

## 2019-06-26 NOTE — Progress Notes (Signed)
No growth returned in his urine. He had 2+ leukocytes on clean catch dipstick.He should finish the bactrim DS antibiotics to see if any improvement.  If he is still having symptoms and urology does not feel related. He can not see GI until January.  Referral has been placed to orthopedics for his back x ray/ leg pain.  I feel it could be an abdominal strain as well given results of all else. Continue to follow Dr. Sabino Snipes instructions for abdominal strain as well. Report any new or changing symptoms.   Alexander Moss - Also for his lower abdomen I recommend a follow up with Dr. Caryn Section in the next couple of weeks and sooner if it persists. ER for any emergent symptoms.

## 2019-06-26 NOTE — Telephone Encounter (Signed)
-----   Message from Doreen Beam, [place] sent at 06/26/2019  8:29 AM EST ----- No growth returned in his urine. He had 2+ leukocytes on clean catch dipstick.He should finish the bactrim DS antibiotics to see if any improvement.  If he is still having symptoms and urology does not feel related. He can not see GI until January.  Referral has been placed to orthopedics for his back x ray/ leg pain.  I feel it could be an abdominal strain as well given results of all else. Continue to follow Dr. Sabino Snipes instructions for abdominal strain as well. Report any new or changing symptoms.   Alexander Moss - Also for his lower abdomen I recommend a follow up with Dr. Caryn Section in the next couple of weeks and sooner if it persists. ER for any emergent symptoms.

## 2019-06-27 DIAGNOSIS — N390 Urinary tract infection, site not specified: Secondary | ICD-10-CM | POA: Diagnosis not present

## 2019-06-27 DIAGNOSIS — M79605 Pain in left leg: Secondary | ICD-10-CM | POA: Diagnosis not present

## 2019-06-27 DIAGNOSIS — R1030 Lower abdominal pain, unspecified: Secondary | ICD-10-CM | POA: Diagnosis not present

## 2019-06-27 DIAGNOSIS — R102 Pelvic and perineal pain: Secondary | ICD-10-CM | POA: Diagnosis not present

## 2019-06-27 DIAGNOSIS — M79604 Pain in right leg: Secondary | ICD-10-CM | POA: Diagnosis not present

## 2019-06-27 DIAGNOSIS — R319 Hematuria, unspecified: Secondary | ICD-10-CM | POA: Diagnosis not present

## 2019-06-28 LAB — CBC WITH DIFFERENTIAL/PLATELET
Basophils Absolute: 0.1 10*3/uL (ref 0.0–0.2)
Basos: 1 %
EOS (ABSOLUTE): 0.5 10*3/uL — ABNORMAL HIGH (ref 0.0–0.4)
Eos: 7 %
Hematocrit: 43.2 % (ref 37.5–51.0)
Hemoglobin: 15.2 g/dL (ref 13.0–17.7)
Immature Grans (Abs): 0 10*3/uL (ref 0.0–0.1)
Immature Granulocytes: 0 %
Lymphocytes Absolute: 1.4 10*3/uL (ref 0.7–3.1)
Lymphs: 19 %
MCH: 33.9 pg — ABNORMAL HIGH (ref 26.6–33.0)
MCHC: 35.2 g/dL (ref 31.5–35.7)
MCV: 96 fL (ref 79–97)
Monocytes Absolute: 0.6 10*3/uL (ref 0.1–0.9)
Monocytes: 9 %
Neutrophils Absolute: 4.5 10*3/uL (ref 1.4–7.0)
Neutrophils: 64 %
Platelets: 217 10*3/uL (ref 150–450)
RBC: 4.48 x10E6/uL (ref 4.14–5.80)
RDW: 13 % (ref 11.6–15.4)
WBC: 7.1 10*3/uL (ref 3.4–10.8)

## 2019-06-28 LAB — COMPREHENSIVE METABOLIC PANEL
ALT: 18 IU/L (ref 0–44)
AST: 19 IU/L (ref 0–40)
Albumin/Globulin Ratio: 1.3 (ref 1.2–2.2)
Albumin: 3.6 g/dL — ABNORMAL LOW (ref 3.7–4.7)
Alkaline Phosphatase: 83 IU/L (ref 39–117)
BUN/Creatinine Ratio: 17 (ref 10–24)
BUN: 19 mg/dL (ref 8–27)
Bilirubin Total: 0.3 mg/dL (ref 0.0–1.2)
CO2: 22 mmol/L (ref 20–29)
Calcium: 9.4 mg/dL (ref 8.6–10.2)
Chloride: 101 mmol/L (ref 96–106)
Creatinine, Ser: 1.13 mg/dL (ref 0.76–1.27)
GFR calc Af Amer: 73 mL/min/{1.73_m2} (ref >59)
GFR calc non Af Amer: 63 mL/min/{1.73_m2} (ref >59)
Globulin, Total: 2.8 g/dL (ref 1.5–4.5)
Glucose: 70 mg/dL (ref 65–99)
Potassium: 4.7 mmol/L (ref 3.5–5.2)
Sodium: 136 mmol/L (ref 134–144)
Total Protein: 6.4 g/dL (ref 6.0–8.5)

## 2019-07-01 ENCOUNTER — Encounter: Payer: Self-pay | Admitting: Family Medicine

## 2019-07-01 ENCOUNTER — Telehealth: Payer: Self-pay

## 2019-07-01 NOTE — Telephone Encounter (Signed)
This encounter was created in error - please disregard.

## 2019-07-01 NOTE — Telephone Encounter (Signed)
Please let patient know the following :  Kidney function returned to normal on lab.  Electrolytes normal, liver function normal  CBC no white blood cell elevation now, all improved.   Will you find out if he schedule with GI and orthopedics ?  Is he feeling better ?

## 2019-07-01 NOTE — Telephone Encounter (Signed)
-----   Message from Doreen Beam, [place] sent at 07/01/2019  9:35 AM EST ----- Please let patient know the following :  Kidney function returned to normal on lab.  Electrolytes normal, liver function normal  CBC no white blood cell elevation now, all improved.   Will you find out if he schedule with GI and orthopedics ?  Is he feeling better ?

## 2019-07-01 NOTE — Telephone Encounter (Signed)
See message below. KW 

## 2019-07-01 NOTE — Telephone Encounter (Signed)
Left message for patient to call office back, okay for triage nurse to advise as below. KW

## 2019-07-01 NOTE — Telephone Encounter (Signed)
From [facility] 

## 2019-07-02 ENCOUNTER — Telehealth: Payer: Self-pay

## 2019-07-02 NOTE — Telephone Encounter (Signed)
Patient has been advised as below, patient states that he did not bring up back pain  during visit. I went over office note from last visit and what was discussed with patient. Patient states that he had told you at visit that he is having abdominal pain that is radiating down into his pelvis intermittently  and shooting down into his leg. He states that his concerns was his abdomen and pelvic region. I went over xray results with patient to let him know what was seen and advised him that if back pain is not a issue that he wanted to address at this time to keep his scheduled appt with G.I and follow up PRN. KW

## 2019-07-02 NOTE — Telephone Encounter (Signed)
Thanks. He mentioned back ache at visit. I will address at next visit, lumbar x ray changes and need for a cane some days with bilateral leg pain that was the reason for referral to orthopedics. He has a follow up scheduled. He was also evaluated for pain in his abdomen same day. Sees GI 12/23.

## 2019-07-02 NOTE — Telephone Encounter (Signed)
[facility] was reffered to [facility] for the moderate degenerative lumbar spine and leg pain not the abdominal pain. They should be able to see him at [facility]. Can you please follow up on this ? I would like him to see them as well as GI.  I added diagnosis of degenerative lumbar spine.  Please let patient know outcome.   Thanks, Kimbra Marcelino MSN, AGNP-C, FNP-C       CLINICAL DATA:  Low back and bilateral intermittent thigh pain.   EXAM: LUMBAR SPINE - COMPLETE 4+ VIEW   COMPARISON:  None.   FINDINGS: Five non-rib-bearing lumbar vertebrae. Moderate degenerative changes throughout the lumbar and lower thoracic spine. These include facet degenerative changes with associated minimal retrolisthesis at the L1-2, L4-5 and L5-S1 levels and minimal anterolisthesis at the L2-3 level. No fractures or pars defects. Mild scoliosis.   IMPRESSION: Moderate lumbar and lower thoracic spine degenerative changes and mild scoliosis.

## 2019-07-02 NOTE — Telephone Encounter (Signed)
Copied from [place] [phone]. Topic: General - Other >> Jul 02, 2019 10:07 AM Parke Poisson wrote: Reason for CRM:  pt was told by [facility] they would be unable to treat him for diagnosis given R10.2 (ICD-10-CM) - Pelvic pain in male M79.604,M79.605 (ICD-10-CM) - Leg pain, bilateral N39.0,R31.9 (ICD-10-CM) - Urinary tract infection with hematuria, site unspecified R10.30 (ICD-10-CM) - Recurrent lower abdominal pain

## 2019-07-03 DIAGNOSIS — Z800 Family history of malignant neoplasm of digestive organs: Secondary | ICD-10-CM | POA: Diagnosis not present

## 2019-07-03 DIAGNOSIS — M7918 Myalgia, other site: Secondary | ICD-10-CM | POA: Diagnosis not present

## 2019-07-03 DIAGNOSIS — Z86010 Personal history of colonic polyps: Secondary | ICD-10-CM | POA: Diagnosis not present

## 2019-07-08 ENCOUNTER — Other Ambulatory Visit: Payer: Self-pay

## 2019-07-08 ENCOUNTER — Encounter: Payer: Self-pay | Admitting: Adult Health

## 2019-07-08 ENCOUNTER — Ambulatory Visit (INDEPENDENT_AMBULATORY_CARE_PROVIDER_SITE_OTHER): Payer: PPO | Admitting: Adult Health

## 2019-07-08 VITALS — BP 132/72 | HR 61 | Temp 96.8°F | Resp 16 | Wt 231.0 lb

## 2019-07-08 DIAGNOSIS — R109 Unspecified abdominal pain: Secondary | ICD-10-CM | POA: Diagnosis not present

## 2019-07-08 DIAGNOSIS — Z5320 Procedure and treatment not carried out because of patient's decision for unspecified reasons: Secondary | ICD-10-CM

## 2019-07-08 DIAGNOSIS — M5136 Other intervertebral disc degeneration, lumbar region: Secondary | ICD-10-CM | POA: Diagnosis not present

## 2019-07-08 DIAGNOSIS — R102 Pelvic and perineal pain: Secondary | ICD-10-CM | POA: Diagnosis not present

## 2019-07-08 DIAGNOSIS — R1030 Lower abdominal pain, unspecified: Secondary | ICD-10-CM | POA: Diagnosis not present

## 2019-07-08 MED ORDER — GABAPENTIN 300 MG PO CAPS: ORAL_CAPSULE | ORAL | 0 refills | Status: DC

## 2019-07-08 NOTE — Patient Instructions (Signed)
Muscle Strain  A muscle strain is an injury that occurs when a muscle is stretched beyond its normal length. Usually, a small number of muscle fibers are torn when this happens. There are three types of muscle strains. First-degree strains have the least amount of muscle fiber tearing and the least amount of pain. Second-degree and third-degree strains have more tearing and pain.  Usually, recovery from muscle strain takes 1-2 weeks. Complete healing normally takes 5-6 weeks.  What are the causes?  This condition is caused when a sudden, violent force is placed on a muscle and stretches it too far. This may occur with a fall, lifting, or sports.  What increases the risk?  This condition is more likely to develop in athletes and people who are physically active.  What are the signs or symptoms?  Symptoms of this condition include:  · Pain.  · Bruising.  · Swelling.  · Trouble using the muscle.  How is this diagnosed?  This condition is diagnosed based on a physical exam and your medical history. Tests may also be done, including an X-ray, ultrasound, or MRI.  How is this treated?  This condition is initially treated with PRICE therapy. This therapy involves:  · Protecting the muscle from being injured again.  · Resting the injured muscle.  · Icing the injured muscle.  · Applying pressure (compression) to the injured muscle. This may be done with a splint or elastic bandage.  · Raising (elevating) the injured muscle.  Your health care provider may also recommend medicine for pain.  Follow these instructions at home:  If you have a splint:  · Wear the splint as told by your health care provider. Remove it only as told by your health care provider.  · Loosen the splint if your fingers or toes tingle, become numb, or turn cold and blue.  · Keep the splint clean.  · If the splint is not waterproof:  ? Do not let it get wet.  ? Cover it with a watertight covering when you take a bath or a shower.  Managing pain, stiffness,  and swelling    · If directed, put ice on the injured area.  ? If you have a removable splint, remove it as told by your health care provider.  ? Put ice in a plastic bag.  ? Place a towel between your skin and the bag.  ? Leave the ice on for 20 minutes, 2-3 times a day.  · Move your fingers or toes often to avoid stiffness and to lessen swelling.  · Raise (elevate) the injured area above the level of your heart while you are sitting or lying down.  · Wear an elastic bandage as told by your health care provider. Make sure that it is not too tight.  General instructions  · Take over-the-counter and prescription medicines only as told by your health care provider.  · Restrict your activity and rest the injured muscle as told by your health care provider. Gentle movements may be allowed.  · If physical therapy was prescribed, do exercises as told by your health care provider.  · Do not put pressure on any part of the splint until it is fully hardened. This may take several hours.  · Do not use any products that contain nicotine or tobacco, such as cigarettes and e-cigarettes. These can delay bone healing. If you need help quitting, ask your health care provider.  · Ask your health care provider when it   You have more pain or swelling in the injured area. Get help right away if:  You have numbness or tingling or lose a lot of strength in the injured area. Summary  A muscle strain is an injury that occurs when a muscle is stretched beyond its normal length.  This condition is caused when a sudden, violent force is placed on a muscle and stretches it too far.  This condition is initially treated with PRICE therapy, which involves protecting, resting,  icing, compressing, and elevating.  Gentle movements may be allowed. If physical therapy was prescribed, do exercises as told by your health care provider. This information is not intended to replace advice given to you by your health care provider. Make sure you discuss any questions you have with your health care provider. Document Released: 06/27/2005 Document Revised: 06/09/2017 Document Reviewed: 08/03/2016 Elsevier Patient Education  2020 [employer].

## 2019-07-08 NOTE — Progress Notes (Addendum)
Patient: Alexander Moss Male    DOB: 09/26/1945   73 y.o.   MRN: [id number] Visit Date: 07/08/2019  Today's Provider: Marcille Buffy, FNP   Chief Complaint  Patient presents with  . Follow-up   Subjective:     HPI Patient returns to office today for follow up visit after being seen in office on 06/24/19 with complaints of lower abdominal pain radiating into his pelvis. Patient was seen by specialist  at [facility] by physician assistant Alexander Moss on 07/03/19. A order was put in through specialist for patient to have pelvic floor therapy and encouraged to take Gabapentin as prescribed. Patient reports today that he still has abdominal pain but states that it comes in waves.    Leg discomfort is better now he reports. He reports abdominal discomfort is improved. He " always has back pain" no changes. Denies any saddle paresthesia or loss of bowel bladder bowel control.   Reviewed visit with Alexander Klippel PA-C . Patient reports he is not ready to do colonoscopy he is aware he is overdue. Howwever he wants to hold off for a while.  GI medications: -- Current: Colace 200 mg BID prn. -- Prior: none.  Interval History   Alexander Moss is a 73 y.o.male returns for evaluation of:  1. Abdominal muscle pain  2. History of adenomatous polyp of colon  3. Family history of colon cancer   He describes pain radiating across his lower abdomen "like a band from hip to hip", which radiates down into his pelvis where his hip meets his groin. It is worse with activity like lifting his legs or lifting items using his legs. When pain is severe, it occurs with walking as well. It improves with rest, and is not present when he is still. Pain began after lifting and moving some heavy items shortly after undergoing laser TURP (see below). Pain is unaffected by meals or BMs. He took Colace following a knee replacement (see below), but has since discontinued. He denies constipation,  diarrhea, hematochezia, melena, weight loss, or anorexia at this time. Also no upper GI concerns.   He was evaluated by Dr. Vernie Moss, diagnosed with muscular pain, and prescribed Robaxin. He recalls pain initially improved a bit, but then worsened despite continued use. Prednisone was added without additional benefit. He was then evaluated by Dr. Caryn Moss, diagnosed with neuropathic pain, and prescribed gabapentin. He again experienced initial relief before pain worsened again.   He also underwent laser TURP for BPH with LUTS on 03/05/19. He followed up with urology (Dr. Yves Moss), with CT A/P demonstrated marked bladder wall thickening and extensive irregularly along the inferior margin. This, as well as his continued mild hematuria, was noted as somewhat normal findings following TURP. Per patient, urology does not think this his pain is due to surgery or bladder/prostate issues. He continues to have some burning with urination, frequency, and hesitancy, but these symptoms are stable and unchanged. Symptoms began after TKA in 12/2018.   Abdominal surgical history is notable for open left inguinal hernia with mesh on 09/07/18 (Dr. Celine Moss). He followed up with Dr. Celine Moss on 04/30/19, and was also diagnosed with abdominal pain of musculoskeletal origin.  Notably, Alexander Moss is also awaiting an orthopedic consultation for moderate lumbar and thoracic spine DDD, as well as mild scoliosis, on xray. He is taking naproxen 440 mg BID for arthritis-related pain.  Family history is notable for "cancer attached to colon and liver" in  his sister at age 39. No known family history of colon polyps or other GI malignancy. Procedures: GI procedures reviewed in Surgical History.  Assessment & Plan   Alexander Moss is a 73 y.o. male seen today for:  1. Abdominal muscle pain  2. History of adenomatous polyp of colon  3. Family history of colon cancer   Symptoms are most consistent with musculoskeletal +/- neuropathic  abdominal pain, particularly given reassuring CT A/P and improvement with robaxin and gabapentin. Lack of weight loss, anorexia, IDA, change to baseline bowel pattern after recovering from TKA, hematochezia, etc. is reassuring. I recommend a referral to pelvic floor PT for further evaluation and management techniques, as well as obtaining a colonoscopy for endoluminal evaluation (though suspicion for abnormalities is low). Alexander Moss agreed to the placement of the PT referral, but will discuss if he wants to go through with it with his wife and PCP. Additionally, he would like to hold off on a colonoscopy at this time, but is aware he is overdue for colonoscopy at this time.  - Refer to pelvic floor PT. - Continue gabapentin as prescribed by PCP, if desire and helpful. - Maintain adequate intake of soluble fiber and water. - Daily physical activity as tolerated, such as walking for 20 minutes. - Call to schedule colonoscopy when ready.  Procedure orders (when requested): The procedure, risks, and benefits were discussed in detail, including, but not limited to bleeding, infection, perforation, and difficulty with sedation. The patient voiced their understanding and all questions were answered.  Procedure: Diagnostic colonoscopy if pain persists. If pain resolves, surveillance colonoscopy. Location: [facility]. Prep: Miralax/Sugar-free Gatorade. Clearances: None. Anticoagulation: None.  Medications   Orders Placed This Encounter  Procedures  . Ambulatory Referral to Physical Therapy   Requested Prescriptions   No prescriptions requested or ordered in this encounter   Follow-up   Return to schedule colonoscopy, or as needed if symptoms worsen or fail to improve.     Previous prostate surgery. Improving still has small clots with urology Alexander Moss said this would persist. He sees urology again in June.    Reviewed x ray as well - he reports he has seen [facility]. 03/2019 and did  not follow up.   EXAM: LUMBAR SPINE - COMPLETE 4+ VIEW  COMPARISON:  None.  FINDINGS: Five non-rib-bearing lumbar vertebrae. Moderate degenerative changes throughout the lumbar and lower thoracic spine. These include facet degenerative changes with associated minimal retrolisthesis at the L1-2, L4-5 and L5-S1 levels and minimal anterolisthesis at the L2-3 level. No fractures or pars defects. Mild scoliosis.  IMPRESSION: Moderate lumbar and lower thoracic spine degenerative changes and mild scoliosis.  No Known Allergies   Current Outpatient Medications:  .  calcium carbonate (TUMS EX) 750 MG chewable tablet, Chew 750-1,500 mg by mouth daily as needed for heartburn., Disp: , Rfl:  .  docusate sodium (COLACE) 100 MG capsule, Take 2 capsules (200 mg total) by mouth 2 (two) times daily., Disp: 120 capsule, Rfl: 3 .  fexofenadine (ALLEGRA) 180 MG tablet, Take 180 mg by mouth every morning. , Disp: , Rfl:  .  gabapentin (NEURONTIN) 300 MG capsule, Take one at bedtime for 4 days, then take one twice daily, Disp: 60 capsule, Rfl: 0 .  Omega-3 Fatty Acids (FISH OIL ULTRA) 1400 MG CAPS, Take 1,400 mg by mouth daily., Disp: , Rfl:  .  vitamin B-12 (CYANOCOBALAMIN) 1000 MCG tablet, Take 1,000 mcg by mouth daily. , Disp: , Rfl:  .  Vitamin  E 400 units TABS, Take 400 Units by mouth daily. , Disp: , Rfl:  .  Zinc 25 MG TABS, Take 25 mg by mouth daily. , Disp: , Rfl:  .  benzonatate (TESSALON) 100 MG capsule, Take 1 capsule (100 mg total) by mouth 3 (three) times daily as needed for cough. (Patient not taking: Reported on 06/24/2019), Disp: 20 capsule, Rfl: 0 .  dutasteride (AVODART) 0.5 MG capsule, Take 0.5 mg by mouth every morning. , Disp: , Rfl:  .  FLUZONE HIGH-DOSE QUADRIVALENT 0.7 ML SUSY, , Disp: , Rfl:  .  meloxicam (MOBIC) 15 MG tablet, Take 1 tablet (15 mg total) by mouth daily. (Patient not taking: Reported on 06/24/2019), Disp: 14 tablet, Rfl: 2 .  methocarbamol (ROBAXIN) 500 MG  tablet, Take 2 tablets (1,000 mg total) by mouth every 8 (eight) hours as needed for muscle spasms. (Patient not taking: Reported on 06/24/2019), Disp: 60 tablet, Rfl: 2 .  sulfamethoxazole-trimethoprim (BACTRIM DS) 800-160 MG tablet, Take 1 tablet by mouth 2 (two) times daily. (Patient not taking: Reported on 07/08/2019), Disp: 20 tablet, Rfl: 0  Review of Systems  Constitutional: Negative.   HENT: Negative.   Gastrointestinal: Positive for abdominal pain. Negative for abdominal distention, anal bleeding, blood in stool, constipation, diarrhea, nausea, rectal pain and vomiting.  Genitourinary: Positive for hematuria. Negative for decreased urine volume, difficulty urinating, discharge, dysuria, enuresis, flank pain, frequency, genital sores, penile pain, penile swelling, scrotal swelling, testicular pain and urgency.  Musculoskeletal: Negative.   Skin: Negative.   Neurological: Negative.   Psychiatric/Behavioral: Negative.     Social History   Tobacco Use  . Smoking status: Former Smoker    Packs/day: 2.00    Years: 15.00    Pack years: 30.00    Quit date: 05/30/1980    Years since quitting: 39.1  . Smokeless tobacco: Never Used  . Tobacco comment: remote smoking history, quit in 1981  Substance Use Topics  . Alcohol use: Yes    Alcohol/week: 1.0 - 3.0 standard drinks    Types: 1 - 3 Standard drinks or equivalent per week    Comment: occ beer or wine      Objective:   BP 132/72   Pulse 61   Temp (!) 96.8 F (36 C) (Oral)   Resp 16   Wt 231 lb (104.8 kg)   SpO2 95%   BMI 35.65 kg/m  Vitals:   07/08/19 1105  BP: 132/72  Pulse: 61  Resp: 16  Temp: (!) 96.8 F (36 C)  TempSrc: Oral  SpO2: 95%  Weight: 231 lb (104.8 kg)  Body mass index is 35.65 kg/m.   Physical Exam Vitals reviewed.  Constitutional:      General: He is not in acute distress.    Appearance: Normal appearance. He is not ill-appearing, toxic-appearing or diaphoretic.  HENT:     Head:  Normocephalic and atraumatic.     Mouth/Throat:     Mouth: Mucous membranes are moist.  Eyes:     Pupils: Pupils are equal, round, and reactive to light.  Cardiovascular:     Rate and Rhythm: Normal rate and regular rhythm.     Pulses: Normal pulses.     Heart sounds: Normal heart sounds. No murmur. No friction rub. No gallop.   Pulmonary:     Effort: Pulmonary effort is normal. No respiratory distress.     Breath sounds: Normal breath sounds. No stridor. No wheezing, rhonchi or rales.  Chest:     Chest  wall: No tenderness.  Abdominal:     General: There is no distension.     Palpations: Abdomen is soft. There is no mass.     Tenderness: There is no abdominal tenderness. There is no right CVA tenderness, left CVA tenderness, guarding or rebound.     Hernia: No hernia is present.  Musculoskeletal:        General: Normal range of motion.     Cervical back: Normal range of motion and neck supple.  Skin:    General: Skin is warm and dry.     Capillary Refill: Capillary refill takes less than 2 seconds.  Neurological:     General: No focal deficit present.     Mental Status: He is alert and oriented to person, place, and time.  Psychiatric:        Mood and Affect: Mood normal.        Behavior: Behavior normal.        Thought Content: Thought content normal.        Judgment: Judgment normal.      No results found for any visits on 07/08/19.     Assessment & Plan    Recurrent lower abdominal pain  Abdominal wall pain - Plan: gabapentin (NEURONTIN) 300 MG capsule  Pelvic pain in male  Degenerative disc disease, lumbar  Colonoscopy refused- at this time    Meds ordered this encounter  Medications  . gabapentin (NEURONTIN) 300 MG capsule    Sig: Take one tablet twice daily.    Dispense:  60 capsule    Refill:  0    Follow up with Dr. Caryn Moss in one month and sooner if needed.   The entirety of the information documented in the History of Present Illness, Review of  Systems and Physical Exam were personally obtained by me. Portions of this information were initially documented by the  Certified Medical Assistant whose name is documented in [place] and reviewed by me for thoroughness and accuracy.  I have personally performed the exam and reviewed the chart and it is accurate to the best of my knowledge.  Haematologist has been used and any errors in dictation or transcription are unintentional.  Kelby Aline. [place], [facility]

## 2019-07-10 ENCOUNTER — Telehealth: Payer: Self-pay | Admitting: Family Medicine

## 2019-07-10 NOTE — Telephone Encounter (Signed)
Referral Request - Has patient seen PCP for this complaint? Yes.   *If NO, is insurance requiring patient see PCP for this issue before PCP can refer them? Referral for which specialty: Physical Therapy Preferred provider/office: [facility] Reason for referral: Patient would like to have a referral send to [facility].

## 2019-07-10 NOTE — Telephone Encounter (Signed)
Order was already place by [place].

## 2019-07-10 NOTE — Telephone Encounter (Signed)
From [facility], patient is requesting referral for PT

## 2019-07-17 ENCOUNTER — Ambulatory Visit: Payer: PPO | Admitting: Physician Assistant

## 2019-07-18 ENCOUNTER — Ambulatory Visit: Payer: PPO | Admitting: Physical Therapy

## 2019-07-18 ENCOUNTER — Ambulatory Visit: Payer: PPO | Admitting: Physician Assistant

## 2019-07-25 ENCOUNTER — Ambulatory Visit: Payer: PPO | Attending: Student | Admitting: Physical Therapy

## 2019-07-25 ENCOUNTER — Other Ambulatory Visit: Payer: Self-pay

## 2019-07-25 DIAGNOSIS — M25662 Stiffness of left knee, not elsewhere classified: Secondary | ICD-10-CM | POA: Insufficient documentation

## 2019-07-25 DIAGNOSIS — R278 Other lack of coordination: Secondary | ICD-10-CM | POA: Diagnosis not present

## 2019-07-25 DIAGNOSIS — M25562 Pain in left knee: Secondary | ICD-10-CM | POA: Diagnosis not present

## 2019-07-25 DIAGNOSIS — R2689 Other abnormalities of gait and mobility: Secondary | ICD-10-CM | POA: Insufficient documentation

## 2019-07-25 DIAGNOSIS — M6281 Muscle weakness (generalized): Secondary | ICD-10-CM | POA: Insufficient documentation

## 2019-07-25 DIAGNOSIS — M533 Sacrococcygeal disorders, not elsewhere classified: Secondary | ICD-10-CM | POA: Diagnosis not present

## 2019-07-25 DIAGNOSIS — R262 Difficulty in walking, not elsewhere classified: Secondary | ICD-10-CM | POA: Insufficient documentation

## 2019-07-25 NOTE — Patient Instructions (Addendum)
Open book ( rotating L, sidelying on R )  15 reps x 3 day    ___  Cross L thigh over R thigh stretch on your Back  puling strap in R hand, knee and thigh towards the R armpit   5 breaths    ____     3- foot tap  10 reps  Each side   Hold onto wall   Slightly bend of standing leg, and keep hips above foot   [place] of opposite leg   taps to each direction and back to spot under hips   ____ Wear R shoe lift    ____ Minimize straining pelvic floor/ abdomen Log rolling to sidelying before in and out bed   sit to stand with exhale

## 2019-07-26 NOTE — Therapy (Addendum)
[place] MAIN [facility] SERVICES [place], [place], [address] Phone: [phone]   Fax:  [phone]  Physical Therapy Evaluation  Patient Details  Name: Alexander Moss MRN: [id number] Date of Birth: Feb 01, 1946 Referring Provider (PT): Tammi Klippel   Encounter Date: 07/25/2019    Past Medical History:  Diagnosis Date  . Arthritis   . GERD (gastroesophageal reflux disease)   . Hyperlipidemia   . Left inguinal hernia 08/2018    Past Surgical History:  Procedure Laterality Date  . GREEN LIGHT LASER TURP (TRANSURETHRAL RESECTION OF PROSTATE N/A 03/05/2019   Procedure: GREEN LIGHT LASER TURP (TRANSURETHRAL RESECTION OF PROSTATE;  Surgeon: Royston Cowper, MD;  Location: [facility] ORS;  Service: Urology;  Laterality: N/A;  . [place]  . INGUINAL HERNIA REPAIR Left 09/07/2018   Procedure: OPEN LEFT HERNIA REPAIR - INGUINAL WITH MESH;  Surgeon: Fredirick Maudlin, MD;  Location: [facility] ORS;  Service: General;  Laterality: Left;  . JOINT REPLACEMENT Right 2017   Knee  . KNEE SURGERY Bilateral 2003  . TONSILLECTOMY AND ADENOIDECTOMY    . TOTAL KNEE ARTHROPLASTY Right 05/23/2016   Procedure: RIGHT TOTAL KNEE ARTHROPLASTY;  Surgeon: Gaynelle Arabian, MD;  Location: [facility];  Service: Orthopedics;  Laterality: Right;  . TOTAL KNEE ARTHROPLASTY Left 01/07/2019   Procedure: TOTAL KNEE ARTHROPLASTY;  Surgeon: Gaynelle Arabian, MD;  Location: [facility];  Service: Orthopedics;  Laterality: Left;  44min    There were no vitals filed for this visit.   Subjective Assessment - 07/25/19 1017    Subjective 1) Abdominal pain across L and R below navel and occasionally radiates to lower pelvic floor. It started after moving furniture in and out of rooms, up and down 2 flights of stairs, painting walls. Pt had a CT-scan and results were neg. Pt took muscle relaxers and it helped by 40-50%. But then it plateaued. Pt tried Prednisone which did not  help. Then he took Gabapentin but it plateaued. He got tetsted for UTI and it was neg. Pt went to the GI doctor who cleared him of medical issues and was referred to pelvic PT.  Pt has been sitting in a chair since Jul and he used to build houses, boats. These activities required 80 lbs, pushing, climb a ladder, carrying bundles of roofing shingles.      2) LBP with DDD. Pt has had back pain since he was a boy after falling onto a log. Pt had xrays at [facility].  Pt went to see his surgeon who did the L hernia surgery and both sides are cleared. Daily fluid intake: 48 fl oz water, 12-16 fl oz coffee. 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 Pt is now caretaking his wife because she slipped and fell and she is in a RW. He is cooking, going up and down the stairs   3)  Urinary incontinence: L TKA end of June 2020 and had a UTI after the surgery. Pt was not able to empty his bladder completely because of enlarged prostate. Pt underwent laser sugery  Aug 2020  which helped his urine flow. Pt had been incontinent afterwards but he is doing his kegels now and it got better to 85%-90%.  Pt decreased diapers from 3 to 1 per day.     Pertinent History  Hx of UTI after surgeries.  Hemmorrhoid surgeries  55 years ago.   L inguinal hernia 2020 Feb, L TKA 2020 Jun with only PT 3 sessions.         [facility] PT Assessment - 07/25/19 1054      Assessment   Medical Diagnosis  abdominal pain     Referring Provider (PT)  [place] residence    Type of [place] Access  Stairs to enter   [place]   3   Additional Comments  Uses Crutch on R because he finds it more stable with stairs       Prior Function   Level of Independence  Independent      Coordination   Gross Motor Movements are Fluid and Coordinated  --    chest breathing      PROM   Overall PROM Comments  FADDIR L, restricted 0 edeg, R ~20 deg (post Tx: FADDIR ~20 deg L)        Strength   Overall Strength  --   B LE seated 5/5 , L hip abd 5/5     Palpation   Spinal mobility  L Side bend limited > R ( post Tx: increased)      SI assessment   L iliac crest higher, L pelvis shift. Shoulders levelled, T/L juction convex to R standing. L pubic symphysis lower than R in supine, L ASIS more anterior and lower , L malleoli lower  ( post Tx; L iliac crest still higher than R, and less anteriorly rotated. ( POst Tx and flat insole in R shoe:  iliac crest crest even)       Palpation comment  reproduction of abdominal pain  supra pubic area/ pubic symphysis  ( post Tx: less pain at pubic sym        Bed Mobility   Rolling Right  --   reproduced L pubic pain ( post Tx: no pain)      Ambulation/Gait   Pre-Gait Activities  with sole in R shoe, less R trunk lean, still lacking L pelvic rotation,, increased R stance phase     Gait Comments  0.88 m/s without crunch. R trunk lean, L pelvic shift, R decreased stance w/ /o L pain.  ( post Tx:  2/3 less  pain. 1.0  m/s, with shoe lift in all parts of the sole:  1.1 m/s)   )                  Objective measurements completed on examination: See above findings.       Provided shoelift,  [facility] Adult PT Treatment/Exercise - 07/29/19 1218      Bed Mobility   Rolling Right  --   reproduced L pubic pain ( post Tx: no pain)      Ambulation/Gait   Gait Comments  0.88 m/s without crunch. R trunk lean, L pelvic shift, R decreased stance w/ /o L pain.  ( post Tx:  2/3 less  pain. 1.0  m/s, with shoe lift in all parts of the sole:  1.1 m/s)   )        Therapeutic Activites    Therapeutic Activities  --   provided shoe lift into R shoe     Manual Therapy   Manual therapy comments  L long axis distraction, rotational mob to increase FADDIR mobility, superior rotation if iliac crest  to realign pubic  symphysis                    PT Long Term Goals - 07/29/19 1104      PT LONG TERM GOAL #1   Title  Pt will demo decreased FOTO score of > 5 pts from PFDI Pelvic pain at 29 pts, PFDI Urinary 38 pts to indicate improved function and perform ADLs    Time  10    Period  Weeks    Status  New    Target Date  10/07/19      PT LONG TERM GOAL #2   Title  Pt will demo increased FOTO urinary score from 54 to > 59 pts to indicate improved function and be able to decrease from 1 diaper to small urinary pantyliner per day .    Time  8    Period  Weeks    Status  New    Target Date  09/23/19      PT LONG TERM GOAL #3   Title  Pt will demo increased lumbar mobility in order to progress to multifidis strengthening to perform lifting, pushing, pulling with less LBP    Time  4    Period  Weeks    Status  New    Target Date  08/26/19      PT LONG TERM GOAL #4   Title  Pt will demo more equal pelvic girdle height in standing with shoe lift , no tenderness at pubic symphysis L, and more levelled pubic symphysis across 2 visits in order to progress to deep core HEP and walk with less pain    Time  2    Period  Weeks    Status  New    Target Date  08/12/19      PT LONG TERM GOAL #5   Title  Pt walk > 1000 ft safely/ demo stability  without crutch and ascend/ stairs with safe mechanics / demo stability without crutch and report of < 2/10 pain, and increase gait speed at 0.22m/s without crutch to > 1.2 m/s without crutch   in order to ambulate in his home/ community  and not worsen spinal and pelvic deviations.    Time  4    Period  Weeks    Status  New    Target Date  08/26/19      Additional Long Term Goals   Additional Long Term Goals  Yes      PT LONG TERM GOAL #6   Title  Pt will demo IND and proper technique with deep core strengthening and coordination in order to increase intraabdominal pressure for postural stability and bowel movement elimination    Time  8    Period  Weeks     Status  New    Target Date  09/23/19        Plan - 07/26/19 1249    Clinical Impression Statement Pt is a 74 yo male who reports of  abdominal pain across L and R below navel and occasionally radiates to lower pelvic floor. It started after moving furniture in and out of rooms, up and down 2 flights of stairs, painting walls. At this point in time, pt was ~12 week post-op TURP ( Aug 2020). Pt also had  L inguinal hernia 2020 Feb, L TKA 2020 June with only PT 3 sessions. Pt has been cleared by urologist, gastroenterologist of medical etiology.   Pt also reports experiencing  long standing LBP 2/2 DDD along with urinary incontinence. These deficits impact his QOL and not able to perform activities like building house, boats, climbing ladders, chopping woods.   Pt presents with limited spinal mobility to L, misalignment of iliac crest, pubic symphysis, lumbar spine, decreased gait speed, excessive spinal deviations/ pelvic shift with use of crutch, sigificantly decreased L hip mobility, and poor body mechanics which places strain on the abdominal/pelvic floor mm. These are deficits that indicate an ineffective intraabdominal pressure system associated with increased risk for urinary leakage, postural stability, and pain with functional mobility.   Pt was provided education on etiology of Sx with anatomy, physiology explanation with images along with the benefits of customized pelvic PT Tx based on pt's medical conditions and musculoskeletal deficits.  Explained the physiology of deep core mm coordination and roles of pelvic floor function in urination, defecation, sexual function, and postural control with deep core mm system.   Regional interdependent approaches will yield greater benefits in pt's POC due to the complexity of  medical Hx and the significant impact their Sx have had on their QOL.  Following Tx today which pt tolerated without complaints, pt demo'd equal alignment of pelvic girdle,  less tenderness with palpation at L pubic symphysis, and increased spinal mobility to L sidebend. Pt reported 2/3 less pain with palpation at pubic symphysis and lifting knee up with walking. Pt was provided shoe lift that provided levelled sole in R shoe and his gait deviations decreased. Plan to address spinal/ pelvic girdle/ leg length at next session and initiate deep core strengthening. Pt benefits from skilled PT.       Personal Factors and Comorbidities  Comorbidity 3+    Comorbidities  see multiple surgeries in medical Hx    Examination-Activity Limitations  Continence;Transfers;Lift;Stand;Locomotion Level    Stability/Clinical Decision Making  Evolving/Moderate complexity    Clinical Decision Making  High    Rehab Potential  Good    PT Frequency  1x / week    PT Duration  Other (comment)   10   PT Treatment/Interventions  Moist Heat;Stair training;Gait training;Functional mobility training;Therapeutic activities;Therapeutic exercise;Patient/family education;Neuromuscular re-education;Balance training;Scar mobilization;Manual techniques;Taping;Energy conservation;Spinal Manipulations;Joint Manipulations    Consulted and Agree with Plan of Care  Patient       Patient will benefit from skilled therapeutic intervention in order to improve the following deficits and impairments:  Decreased scar mobility, Decreased strength, Decreased endurance, Decreased range of motion, Decreased safety awareness, Difficulty walking, Hypomobility, Hypermobility, Impaired sensation, Improper body mechanics, Pain, Increased muscle spasms, Decreased mobility, Decreased coordination, Decreased balance, Postural dysfunction  Visit Diagnosis: Other abnormalities of gait and mobility  Difficulty in walking, not elsewhere classified  Other lack of coordination  Sacrococcygeal disorders, not elsewhere classified     Problem List Patient Active Problem List   Diagnosis Date Noted  . Abdominal wall pain  07/08/2019  . Pelvic pain in male 07/08/2019  . Recurrent lower abdominal pain 07/08/2019  . Degenerative disc disease, lumbar 07/08/2019  . Colonoscopy refused- at this time  07/08/2019  . Bilateral knee pain 06/24/2019  . Non-recurrent unilateral inguinal hernia without obstruction or gangrene 08/29/2018  . Osteoarthritis of left knee 05/23/2016  . Hearing loss 01/06/2015  . Chronic back pain 12/09/2014  . GERD (gastroesophageal reflux disease) 12/09/2014  . Obesity 12/09/2014  . PSA Elevation 12/09/2014  . Rosacea 12/09/2014  . Family history of malignant neoplasm of gastrointestinal tract 07/09/2008  . Lumbago 06/25/2007  . Low back pain 06/25/2007  .  Diverticulosis of colon without hemorrhage 09/08/2006    Jerl Mina ,PT, DPT, E-RYT  07/29/2019, 12:27 PM  [place] MAIN [facility] SERVICES [address] [place], [place], [address] Phone: [phone]   Fax:  [phone]  Name: GAMALIER OSTHOFF MRN: [id number] Date of Birth: 03/20/1946

## 2019-07-29 NOTE — Addendum Note (Signed)
Addended by: Jerl Mina on: 07/29/2019 12:29 PM   Modules accepted: Orders

## 2019-08-01 ENCOUNTER — Ambulatory Visit: Payer: PPO | Admitting: Physical Therapy

## 2019-08-01 ENCOUNTER — Other Ambulatory Visit: Payer: Self-pay

## 2019-08-01 DIAGNOSIS — M533 Sacrococcygeal disorders, not elsewhere classified: Secondary | ICD-10-CM

## 2019-08-01 DIAGNOSIS — R2689 Other abnormalities of gait and mobility: Secondary | ICD-10-CM | POA: Diagnosis not present

## 2019-08-01 DIAGNOSIS — R278 Other lack of coordination: Secondary | ICD-10-CM

## 2019-08-01 DIAGNOSIS — R262 Difficulty in walking, not elsewhere classified: Secondary | ICD-10-CM

## 2019-08-01 NOTE — Patient Instructions (Addendum)
Stairs:  slow down when coming down,  use handrail  lower heel slow    use the safer method of turning body and feet 45 deg to the rail .    Up the stairs: wider feet     _____   Open book ( handout)   Deep core level 1 and 2 ( handout)    ____  Wall lean for scoliosis   R Forearm slide against wall as you stand perpendicular to wall, band under feet, feet hip width apart,  Opposite elbow by side, , imagine holding pencil under armpit as you lean toward wall, lowering forearm against the wall and opposite hand pulls band without letting elbow move away from side body  And slide back up , straightening body    Make sure the upper trapezius muscle does not hike up to ear as band is being pulled as you lean towards wall    band  10 x 2    Leaning To L only to open the R  flank area       ___

## 2019-08-01 NOTE — Therapy (Addendum)
[place] MAIN [facility] SERVICES [address] [place], [place], [address] Phone: [phone]   Fax:  [phone]  Physical Therapy Treatment  Patient Details  Name: Alexander Moss MRN: [id number] Date of Birth: May 14, 1946 Referring Provider (PT): Tammi Klippel   Encounter Date: 08/01/2019  PT End of Session - 08/01/19 1148    Visit Number  2    Number of Visits  10    Date for PT Re-Evaluation  10/04/19   eval 07/25/19   PT Start Time  1004    PT Stop Time  1106    PT Time Calculation (min)  62 min       Past Medical History:  Diagnosis Date  . Arthritis   . GERD (gastroesophageal reflux disease)   . Hyperlipidemia   . Left inguinal hernia 08/2018    Past Surgical History:  Procedure Laterality Date  . GREEN LIGHT LASER TURP (TRANSURETHRAL RESECTION OF PROSTATE N/A 03/05/2019   Procedure: GREEN LIGHT LASER TURP (TRANSURETHRAL RESECTION OF PROSTATE;  Surgeon: Royston Cowper, MD;  Location: [facility] ORS;  Service: Urology;  Laterality: N/A;  . [place]  . INGUINAL HERNIA REPAIR Left 09/07/2018   Procedure: OPEN LEFT HERNIA REPAIR - INGUINAL WITH MESH;  Surgeon: Fredirick Maudlin, MD;  Location: [facility] ORS;  Service: General;  Laterality: Left;  . JOINT REPLACEMENT Right 2017   Knee  . KNEE SURGERY Bilateral 2003  . TONSILLECTOMY AND ADENOIDECTOMY    . TOTAL KNEE ARTHROPLASTY Right 05/23/2016   Procedure: RIGHT TOTAL KNEE ARTHROPLASTY;  Surgeon: Gaynelle Arabian, MD;  Location: [facility];  Service: Orthopedics;  Laterality: Right;  . TOTAL KNEE ARTHROPLASTY Left 01/07/2019   Procedure: TOTAL KNEE ARTHROPLASTY;  Surgeon: Gaynelle Arabian, MD;  Location: [facility];  Service: Orthopedics;  Laterality: Left;  92min    There were no vitals filed for this visit.  Subjective Assessment - 08/01/19 1056    Subjective  Pt reportsw itht  he shoe lift in his R shoe and after last session, pt has been able to lift his lift more off the ground.  There is less pain in the low abdominal area with walking and he has been walking more without the crutch. Pt has been going up and down the stairs 2/3 of the time.      Pertinent History  Hx of UTI after surgeries.  Hemmorrhoid surgeries 55 years ago.   L inguinal hernia 2020 Feb, L TKA 2020 Jun with only PT 3 sessions. L TKA end of June 2020 and had a UTI after the surgery. Pt was not able to empty his bladder completely because of enlarged prostate. Pt underwent laser sugery  Aug 2020  which helped his urine flow. Pt had been incontinent afterwards but he is doing his kegels now and it got better to 85%-90%.  Pt decreased diapers from 3 to 1 per day.         [facility] PT Assessment - 08/01/19 1149      Palpation   SI assessment   iliac crest more levelled with 2 layers of shoe lift in R shoe.     Palpation comment  tightness at T/L junction L > R, L SIJ mobility limited lacking nutation  . In sidelying, L hip ext limited,  (Post Tx: improved)      Ambulation/Gait   Stairs  --   poor eccentric control on descent, more anterior COM   Stairs Assistance  7: Independent  unilateral UE support on rail    Stair Management Technique  --   narrow BOS on ascend   Gait Comments  preTx: with one layer of shoe lift: limited posterior ppelvic rotatoin, R trunk lean but improved compared to without shoe lift.    postTx with additional layer to shoe insole combined with manual Tx: 1.13 m/s without crutch, less R trunk lean, more posterior pelvic rotation, more reciporcal throax/ pelvic rotation                   [facility] Adult PT Treatment/Exercise - 08/01/19 1153      Therapeutic Activites    Therapeutic Activities  --   assessed stair navigation     Neuro Re-ed    Neuro Re-ed Details   cued for safer stair navgiation with unilateral UE on rail without crutch ( see pt instructions)       Exercises   Exercises  --   see pt instructions: lengthen R lumbar,increase L Pelvic rot      Manual Therapy   Manual therapy comments  STM/MWM at throacic mm, PA/ rotational mob at l SIJ to promote more sacral nutation.                  PT Long Term Goals - 07/29/19 1104      PT LONG TERM GOAL #1   Title  Pt will demo decreased FOTO score of > 5 pts from PFDI Pelvic pain at 29 pts, PFDI Urinary 38 pts to indicate improved function and perform ADLs    Time  10    Period  Weeks    Status  New    Target Date  10/07/19      PT LONG TERM GOAL #2   Title  Pt will demo increased FOTO urinary score from 54 to > 59 pts to indicate improved function and be able to decrease from 1 diaper to small urinary pantyliner per day .    Time  8    Period  Weeks    Status  New    Target Date  09/23/19      PT LONG TERM GOAL #3   Title  Pt will demo increased lumbar mobility in order to progress to multifidis strengthening to perform lifting, pushing, pulling with less LBP    Time  4    Period  Weeks    Status  New    Target Date  08/26/19      PT LONG TERM GOAL #4   Title  Pt will demo more equal pelvic girdle height in standing with shoe lift , no tenderness at pubic symphysis L, and more levelled pubic symphysis across 2 visits in order to progress to deep core HEP and walk with less pain    Time  2    Period  Weeks    Status  New    Target Date  08/12/19      PT LONG TERM GOAL #5   Title  Pt walk > 1000 ft safely/ demo stability  without crutch and ascend/ stairs with safe mechanics / demo stability without crutch and report of < 2/10 pain, and increase gait speed at 0.79m/s without crutch to > 1.2 m/s without crutch   in order to ambulate in his home/ community  and not worsen spinal and pelvic deviations.    Time  4    Period  Weeks    Status  New    Target Date  08/26/19      Additional Long Term Goals   Additional Long Term Goals  Yes      PT LONG TERM GOAL #6   Title  Pt will demo IND and proper technique with deep core strengthening and coordination in order to  increase intraabdominal pressure for postural stability and bowel movement elimination    Time  8    Period  Weeks    Status  New    Target Date  09/23/19            Plan - 08/01/19 1155    Clinical Impression Statement  Pt responded well to shoe lift insole in R shoe from last session as pt reported he has not relied on his crutch with walking and only 2/3 of the time. Pt is able to lift L leg foot higher off the floor when walking with significantly less abdominal pain.   Upon further assessment today, iliac crest on L remains still higher than R and limited L anterior pelvic rotation and slight R trunk lean. These observations rendered the need to add another layer of shoe lift to R shoe ( total of 1.2 inch).  After manual Tx / therapeutic stretches to address spinal deviations and limited pelvic rotation,  Pt demo'd  lengthen R thoracic/ lumbar region and increased L hip ext/ SIJ mobility, and less R trunk lean. Possibly remove 2nd layer of R shoe lift if spinal deviations show further improvements or if pt report discomfortable with increased height.   Pt showed no LOB not difficulty on stairs without crutch use. Pt required excessive cues to slow down with descending stairs, increase eccentric control, and increased BOS with ascending stairs to ambulate safely along stairs. Educated pt to place items he needs to carry upstairs into a back bag and to wear on his bag if items weight > 20 lb so he can keep UE free for rail if need. Anticipate pt may not require crutch with stairs moving forward as along as pt wears shoes in the house.      Initiated deep core strengthening scoliosis specific exercise today which is anticipated to help with pelvic girdle and postural stability.    At next session, plan to perform 6 MWT next session and discuss fitness / aerobic options at this stage of progress ( portable cycler or levelled ground walking short distances)    Pt continues to benefits from  skilled PT.      Personal Factors and Comorbidities  Comorbidity 3+    Comorbidities  see multiple surgeries in medical Hx    Examination-Activity Limitations  Continence;Transfers;Lift;Stand;Locomotion Level    Stability/Clinical Decision Making  Evolving/Moderate complexity    Rehab Potential  Good    PT Frequency  1x / week    PT Duration  Other (comment)   10   PT Treatment/Interventions  Moist Heat;Stair training;Gait training;Functional mobility training;Therapeutic activities;Therapeutic exercise;Patient/family education;Neuromuscular re-education;Balance training;Scar mobilization;Manual techniques;Taping;Energy conservation;Spinal Manipulations;Joint Manipulations    Consulted and Agree with Plan of Care  Patient       Patient will benefit from skilled therapeutic intervention in order to improve the following deficits and impairments:  Decreased scar mobility, Decreased strength, Decreased endurance, Decreased range of motion, Decreased safety awareness, Difficulty walking, Hypomobility, Hypermobility, Impaired sensation, Improper body mechanics, Pain, Increased muscle spasms, Decreased mobility, Decreased coordination, Decreased balance, Postural dysfunction  Visit Diagnosis: Other abnormalities of gait and mobility  Difficulty in walking, not elsewhere classified  Other lack of coordination  Sacrococcygeal disorders, not elsewhere classified     Problem List Patient Active Problem List   Diagnosis Date Noted  . Abdominal wall pain 07/08/2019  . Pelvic pain in male 07/08/2019  . Recurrent lower abdominal pain 07/08/2019  . Degenerative disc disease, lumbar 07/08/2019  . Colonoscopy refused- at this time  07/08/2019  . Bilateral knee pain 06/24/2019  . Non-recurrent unilateral inguinal hernia without obstruction or gangrene 08/29/2018  . Osteoarthritis of left knee 05/23/2016  . Hearing loss 01/06/2015  . Chronic back pain 12/09/2014  . GERD (gastroesophageal  reflux disease) 12/09/2014  . Obesity 12/09/2014  . PSA Elevation 12/09/2014  . Rosacea 12/09/2014  . Family history of malignant neoplasm of gastrointestinal tract 07/09/2008  . Lumbago 06/25/2007  . Low back pain 06/25/2007  . Diverticulosis of colon without hemorrhage 09/08/2006    Jerl Mina ,PT, DPT, E-RYT  08/01/2019, 11:56 AM  [place] MAIN [facility] SERVICES [address] [place], [place], [address] Phone: [phone]   Fax:  [phone]  Name: Alexander Moss MRN: [id number] Date of Birth: 04/19/1946

## 2019-08-08 ENCOUNTER — Ambulatory Visit: Payer: PPO | Admitting: Physical Therapy

## 2019-08-08 ENCOUNTER — Other Ambulatory Visit: Payer: Self-pay

## 2019-08-08 DIAGNOSIS — M533 Sacrococcygeal disorders, not elsewhere classified: Secondary | ICD-10-CM

## 2019-08-08 DIAGNOSIS — R2689 Other abnormalities of gait and mobility: Secondary | ICD-10-CM | POA: Diagnosis not present

## 2019-08-08 DIAGNOSIS — M6281 Muscle weakness (generalized): Secondary | ICD-10-CM

## 2019-08-08 DIAGNOSIS — R262 Difficulty in walking, not elsewhere classified: Secondary | ICD-10-CM

## 2019-08-08 DIAGNOSIS — R278 Other lack of coordination: Secondary | ICD-10-CM

## 2019-08-08 DIAGNOSIS — M25562 Pain in left knee: Secondary | ICD-10-CM

## 2019-08-08 DIAGNOSIS — M25662 Stiffness of left knee, not elsewhere classified: Secondary | ICD-10-CM

## 2019-08-08 NOTE — Patient Instructions (Signed)
,   2 times per day.  1) Open book on both sides  2) clam shells   Clam Shell 45 Degrees Lying R side with hips and knees bent 45, one pillow between knees and ankles. Lift knee with exhale. Be sure pelvis does not roll backward. Do not arch back. Do 20 times, L leg     3)  Deep core level 1 ( 10 reps breaths)  Deep core level 2  ( 6 min)    ___  Standing   1) R side bend  10reps   2) Stop the wall lean in  And replace it with  Standing PNF band exercises  Stand 45 deg to doorway, front leg is opp of hand holding band   "Drawing a sword" " Pulling a lawnmover" Band is under feet, hold band in L hand, across body by R pocket  Press downward into the ballmounds and heels of the feet, thigh muscle active, Keep knees and hips squared the front and do not move them throughout the activity, Exhale to pull band from R pocket,  across the face to the R pocket, rotating only your ribcage to L as if "drawing a sword"   30 reps    3) seated "W"  Band under feet, elbow stays at ribs Chin tuck, inhale, exhale, pull  20 reps

## 2019-08-08 NOTE — Therapy (Signed)
[place] MAIN [facility] SERVICES [address] [place], [place], [address] Phone: [phone]   Fax:  [phone]  Physical Therapy Treatment  Patient Details  Name: Alexander Moss MRN: [id number] Date of Birth: [birth year] Referring Provider (PT): Tammi Klippel   Encounter Date: 08/08/2019  PT End of Session - 08/08/19 1029    Visit Number  3    Number of Visits  10    Date for PT Re-Evaluation  10/04/19   eval 07/25/19   PT Start Time  [id number]    PT Stop Time  1123    PT Time Calculation (min)  68 min       Past Medical History:  Diagnosis Date  . Arthritis   . GERD (gastroesophageal reflux disease)   . Hyperlipidemia   . Left inguinal hernia 08/2018    Past Surgical History:  Procedure Laterality Date  . GREEN LIGHT LASER TURP (TRANSURETHRAL RESECTION OF PROSTATE N/A 03/05/2019   Procedure: GREEN LIGHT LASER TURP (TRANSURETHRAL RESECTION OF PROSTATE;  Surgeon: Royston Cowper, MD;  Location: [facility] ORS;  Service: Urology;  Laterality: N/A;  . [place]  . INGUINAL HERNIA REPAIR Left 09/07/2018   Procedure: OPEN LEFT HERNIA REPAIR - INGUINAL WITH MESH;  Surgeon: Fredirick Maudlin, MD;  Location: [facility] ORS;  Service: General;  Laterality: Left;  . JOINT REPLACEMENT Right 2017   Knee  . KNEE SURGERY Bilateral 2003  . TONSILLECTOMY AND ADENOIDECTOMY    . TOTAL KNEE ARTHROPLASTY Right 05/23/2016   Procedure: RIGHT TOTAL KNEE ARTHROPLASTY;  Surgeon: Gaynelle Arabian, MD;  Location: [facility]S;  Service: Orthopedics;  Laterality: Right;  . TOTAL KNEE ARTHROPLASTY Left 01/07/2019   Procedure: TOTAL KNEE ARTHROPLASTY;  Surgeon: Gaynelle Arabian, MD;  Location: [facility];  Service: Orthopedics;  Laterality: Left;  84min    There were no vitals filed for this visit.  Subjective Assessment - 08/08/19 1026    Subjective  Pt feels better and able to lift his leg higher without pain.  Pain has decreased at 3/10 at the low abdomen and groin over  the last week. Pt was able to tear up carpet for 2 hours with rest afterwards and woke up the next morning feeling normal and slight pain ( but nothing like it was before) with rolling over to get out of bed.    Pertinent History  Hx of UTI after surgeries.  Hemmorrhoid surgeries 55 years ago.   L inguinal hernia 2020 Feb, L TKA 2020 Jun with only PT 3 sessions. L TKA end of June 2020 and had a UTI after the surgery. Pt was not able to empty his bladder completely because of enlarged prostate. Pt underwent laser sugery  Aug 2020  which helped his urine flow. Pt had been incontinent afterwards but he is doing his kegels now and it got better to 85%-90%.  Pt decreased diapers from 3 to 1 per day.         [facility] PT Assessment - 08/08/19 1057      Palpation   SI assessment   iliac crest levelled    Palpation comment  SIJ L nutation in place. L posterior /lateral intercostals, paraspinal L medial scap mm L tightness,       Ambulation/Gait   Gait Comments  limited L thorax and pelvic anterior rotation                    [facility] Adult PT Treatment/Exercise - 08/08/19 1059  Neuro Re-ed    Neuro Re-ed Details   cued for proper technique clams , log rolling. modified band exercises       Exercises   Exercises  --   clam/open book / "w" added, removed wall ex     Manual Therapy   Manual therapy comments  STM/MWM L posterior /lateral intercostals, paraspinal L medial scap mm L ,  PA mob lateral fascets T7-L1                  PT Long Term Goals - 07/29/19 1104      PT LONG TERM GOAL #1   Title  Pt will demo decreased FOTO score of > 5 pts from PFDI Pelvic pain at 29 pts, PFDI Urinary 38 pts to indicate improved function and perform ADLs    Time  10    Period  Weeks    Status  New    Target Date  10/07/19      PT LONG TERM GOAL #2   Title  Pt will demo increased FOTO urinary score from 54 to > 59 pts to indicate improved function and be able to decrease from 1 diaper  to small urinary pantyliner per day .    Time  8    Period  Weeks    Status  New    Target Date  09/23/19      PT LONG TERM GOAL #3   Title  Pt will demo increased lumbar mobility in order to progress to multifidis strengthening to perform lifting, pushing, pulling with less LBP    Time  4    Period  Weeks    Status  New    Target Date  08/26/19      PT LONG TERM GOAL #4   Title  Pt will demo more equal pelvic girdle height in standing with shoe lift , no tenderness at pubic symphysis L, and more levelled pubic symphysis across 2 visits in order to progress to deep core HEP and walk with less pain    Time  2    Period  Weeks    Status  New    Target Date  08/12/19      PT LONG TERM GOAL #5   Title  Pt walk > 1000 ft safely/ demo stability  without crutch and ascend/ stairs with safe mechanics / demo stability without crutch and report of < 2/10 pain, and increase gait speed at 0.3m/s without crutch to > 1.2 m/s without crutch   in order to ambulate in his home/ community  and not worsen spinal and pelvic deviations.    Time  4    Period  Weeks    Status  New    Target Date  08/26/19      Additional Long Term Goals   Additional Long Term Goals  Yes      PT LONG TERM GOAL #6   Title  Pt will demo IND and proper technique with deep core strengthening and coordination in order to increase intraabdominal pressure for postural stability and bowel movement elimination    Time  8    Period  Weeks    Status  New    Target Date  09/23/19            Plan - 08/08/19 1123    Clinical Impression Statement  Pt is making great progress as he reports 3/10 pain at low abdomen and groin, able to lift his legs higher  without pain and performing task with home renovations without pain the next day. Remaining issue is the low grade pain with rolling on side in bed mobility which was addressed today with manual Tx to release L paraspinal/ shoulder mm tightness related to past L leg being  higher and scoliosis. Pt was advanced to resistance band exercises with excessive cues for more lower kinetic chain co-activation. Pt continues to benefit form skilled PT.    Personal Factors and Comorbidities  Comorbidity 3+    Comorbidities  see multiple surgeries in medical Hx    Examination-Activity Limitations  Continence;Transfers;Lift;Stand;Locomotion Level    Stability/Clinical Decision Making  Evolving/Moderate complexity    Rehab Potential  Good    PT Frequency  1x / week    PT Duration  Other (comment)   10   PT Treatment/Interventions  Moist Heat;Stair training;Gait training;Functional mobility training;Therapeutic activities;Therapeutic exercise;Patient/family education;Neuromuscular re-education;Balance training;Scar mobilization;Manual techniques;Taping;Energy conservation;Spinal Manipulations;Joint Manipulations    Consulted and Agree with Plan of Care  Patient       Patient will benefit from skilled therapeutic intervention in order to improve the following deficits and impairments:  Decreased scar mobility, Decreased strength, Decreased endurance, Decreased range of motion, Decreased safety awareness, Difficulty walking, Hypomobility, Hypermobility, Impaired sensation, Improper body mechanics, Pain, Increased muscle spasms, Decreased mobility, Decreased coordination, Decreased balance, Postural dysfunction  Visit Diagnosis: Other abnormalities of gait and mobility  Difficulty in walking, not elsewhere classified  Other lack of coordination  Sacrococcygeal disorders, not elsewhere classified  Left knee pain, unspecified chronicity  Muscle weakness (generalized)  Stiffness of left knee, not elsewhere classified     Problem List Patient Active Problem List   Diagnosis Date Noted  . Abdominal wall pain 07/08/2019  . Pelvic pain in male 07/08/2019  . Recurrent lower abdominal pain 07/08/2019  . Degenerative disc disease, lumbar 07/08/2019  . Colonoscopy refused-  at this time  07/08/2019  . Bilateral knee pain 06/24/2019  . Non-recurrent unilateral inguinal hernia without obstruction or gangrene 08/29/2018  . Osteoarthritis of left knee 05/23/2016  . Hearing loss 01/06/2015  . Chronic back pain 12/09/2014  . GERD (gastroesophageal reflux disease) 12/09/2014  . Obesity 12/09/2014  . PSA Elevation 12/09/2014  . Rosacea 12/09/2014  . Family history of malignant neoplasm of gastrointestinal tract 07/09/2008  . Lumbago 06/25/2007  . Low back pain 06/25/2007  . Diverticulosis of colon without hemorrhage 09/08/2006    Jerl Mina ,PT, DPT, E-RYT  08/08/2019, 11:25 AM  [place] MAIN [facility] SERVICES [address] [place], [place], [address] Phone: [phone]   Fax:  [phone]  Name: Alexander Moss MRN: [id number] Date of Birth: 1945-08-15

## 2019-08-09 ENCOUNTER — Other Ambulatory Visit: Payer: Self-pay

## 2019-08-09 ENCOUNTER — Encounter: Payer: Self-pay | Admitting: Family Medicine

## 2019-08-09 ENCOUNTER — Ambulatory Visit (INDEPENDENT_AMBULATORY_CARE_PROVIDER_SITE_OTHER): Payer: PPO | Admitting: Family Medicine

## 2019-08-09 DIAGNOSIS — R109 Unspecified abdominal pain: Secondary | ICD-10-CM | POA: Diagnosis not present

## 2019-08-09 MED ORDER — GABAPENTIN 300 MG PO CAPS: 300.0000 mg | ORAL_CAPSULE | Freq: Two times a day (BID) | ORAL | 0 refills | Status: AC | PRN

## 2019-08-09 NOTE — Progress Notes (Signed)
Patient: Alexander Moss Male    DOB: 20-Jan-1946   74 y.o.   MRN: [id number] Visit Date: 08/09/2019  Today's Provider: Lelon Huh, MD   Chief Complaint  Patient presents with  . Follow-up   Subjective:     HPI  Follow up for Abdominal wall pain:  The patient was last seen for this 1 months ago (seen by Laverna Peace, FNP Changes made at last visit include starting Gabapentin.  Has also started physical therapy which he states has helped quite a bit. States he was almost to the point that he couldn't walk before starting PT. States he has about 5 more sessions and is going once a week.   He reports good compliance with treatment. He feels that condition is Improved. He is not having side effects.   ------------------------------------------------------------------------------------  No Known Allergies   Current Outpatient Medications:  .  calcium carbonate (TUMS EX) 750 MG chewable tablet, Chew 750-1,500 mg by mouth daily as needed for heartburn., Disp: , Rfl:  .  docusate sodium (COLACE) 100 MG capsule, Take 2 capsules (200 mg total) by mouth 2 (two) times daily., Disp: 120 capsule, Rfl: 3 .  fexofenadine (ALLEGRA) 180 MG tablet, Take 180 mg by mouth every morning. , Disp: , Rfl:  .  FLUZONE HIGH-DOSE QUADRIVALENT 0.7 ML SUSY, , Disp: , Rfl:  .  gabapentin (NEURONTIN) 300 MG capsule, Take one tablet twice daily., Disp: 60 capsule, Rfl: 0 .  Omega-3 Fatty Acids (FISH OIL ULTRA) 1400 MG CAPS, Take 1,400 mg by mouth daily., Disp: , Rfl:  .  vitamin B-12 (CYANOCOBALAMIN) 1000 MCG tablet, Take 1,000 mcg by mouth daily. , Disp: , Rfl:  .  Vitamin E 400 units TABS, Take 400 Units by mouth daily. , Disp: , Rfl:  .  Zinc 25 MG TABS, Take 25 mg by mouth daily. , Disp: , Rfl:  .  dutasteride (AVODART) 0.5 MG capsule, Take 0.5 mg by mouth every morning. , Disp: , Rfl:  .  meloxicam (MOBIC) 15 MG tablet, Take 1 tablet (15 mg total) by mouth daily. (Patient not taking:  Reported on 06/24/2019), Disp: 14 tablet, Rfl: 2  Review of Systems  Constitutional: Negative for appetite change, chills and fever.  Respiratory: Negative for chest tightness, shortness of breath and wheezing.   Cardiovascular: Negative for chest pain and palpitations.  Gastrointestinal: Positive for abdominal pain. Negative for nausea and vomiting.    Social History   Tobacco Use  . Smoking status: Former Smoker    Packs/day: 2.00    Years: 15.00    Pack years: 30.00    Quit date: 05/30/1980    Years since quitting: 39.2  . Smokeless tobacco: Never Used  . Tobacco comment: remote smoking history, quit in 1981  Substance Use Topics  . Alcohol use: Yes    Alcohol/week: 1.0 - 3.0 standard drinks    Types: 1 - 3 Standard drinks or equivalent per week    Comment: occ beer or wine      Objective:   BP 122/78 (BP Location: Left Arm, Patient Position: Sitting, Cuff Size: Large)   Pulse 65   Temp (!) 96.8 F (36 C) (Temporal)   Resp 16   Wt 231 lb (104.8 kg)   BMI 35.65 kg/m  Vitals:   08/09/19 0805  BP: 122/78  Pulse: 65  Resp: 16  Temp: (!) 96.8 F (36 C)  TempSrc: Temporal  Weight: 231 lb (104.8 kg)  Body mass index is 35.65 kg/m.   Physical Exam   General: Appearance:    Obese male in no acute distress  Eyes:    PERRL, conjunctiva/corneas clear, EOM's intact       Lungs:     Clear to auscultation bilaterally, respirations unlabored  Heart:    Normal heart rate. Normal rhythm. No murmurs, rubs, or gallops.   MS:   All extremities are intact.   Neurologic:   Awake, alert, oriented x 3. No apparent focal neurological           defect.           Assessment & Plan    1. Abdominal wall pain Greatly improved since start physical therapy and gabapentin. He has been taking gabapentin twice a day. Advised he can try cutting back to QD, but he could continue BID if he needs to.   The entirety of the information documented in the History of Present Illness, Review  of Systems and Physical Exam were personally obtained by me. Portions of this information were initially documented by Meyer Cory, CMA and reviewed by me for thoroughness and accuracy.   - gabapentin (NEURONTIN) 300 MG capsule; Take 1 capsule (300 mg total) by mouth 2 (two) times daily as needed.  Dispense: 60 capsule; Refill: 0     Lelon Huh, MD  [facility]

## 2019-08-15 ENCOUNTER — Ambulatory Visit: Payer: PPO | Admitting: Gastroenterology

## 2019-08-15 ENCOUNTER — Other Ambulatory Visit: Payer: Self-pay

## 2019-08-15 ENCOUNTER — Ambulatory Visit: Payer: PPO | Attending: Student | Admitting: Physical Therapy

## 2019-08-15 DIAGNOSIS — R2689 Other abnormalities of gait and mobility: Secondary | ICD-10-CM | POA: Diagnosis not present

## 2019-08-15 DIAGNOSIS — R278 Other lack of coordination: Secondary | ICD-10-CM | POA: Diagnosis not present

## 2019-08-15 DIAGNOSIS — M533 Sacrococcygeal disorders, not elsewhere classified: Secondary | ICD-10-CM | POA: Insufficient documentation

## 2019-08-15 DIAGNOSIS — M6281 Muscle weakness (generalized): Secondary | ICD-10-CM | POA: Insufficient documentation

## 2019-08-15 DIAGNOSIS — M25562 Pain in left knee: Secondary | ICD-10-CM | POA: Insufficient documentation

## 2019-08-15 DIAGNOSIS — M25662 Stiffness of left knee, not elsewhere classified: Secondary | ICD-10-CM | POA: Insufficient documentation

## 2019-08-15 DIAGNOSIS — R262 Difficulty in walking, not elsewhere classified: Secondary | ICD-10-CM | POA: Insufficient documentation

## 2019-08-15 NOTE — Patient Instructions (Signed)
Pulling up/ sideways sheet rock   - lunge position ( one forward , other back, both are hip width apart)   -side lunge position    ____   Elbow by your side for all these 3 exercises / bands at the door knob  x 2 day   _____  Red band   Backward lunges with band at doorknob  2 mins . Elbows by ribs, shoulders down and back  Front knee in place above ankle, back foot and toes pointed forward, ( do not turn the hips and toes out) . Heel is up to be able to push off and return R foot next to the L at hip width apart  Carry your center with you as you step to maintain 50% weight in both legs.     Walking with band, body face away from door  Holding band with thumb pointed out  Take 3 steps slow,   backward stepping slow, slight lean,  Keep shoulders squeezing down and back  2 min   ____  One Blue Band : Stand perpendicular to the door  Side Lunge position   Pulling away with outer hand holding band   Shoulders down. Elbow to the body Knee bends behind toes Leg closest tot he door still has pressure pushing into it   20 reps   ___  Facing the door   Lat pull down     band on other side of the door knob  Mini squat position   Squeeze shoulder blades together first,  Hold band with thumbs out Inhale, exhale, pull bands past your pockets  without lifting shoulders up  20 reps   ___   Walk 15 min in the morning and 15 min afternoon

## 2019-08-15 NOTE — Therapy (Signed)
[place] MAIN [facility] SERVICES [address] [place], [place], [address] Phone: [phone]   Fax:  [phone]  Physical Therapy Treatment  Patient Details  Name: Alexander Moss MRN: [id number] Date of Birth: 1945/12/02 Referring Provider (PT): Tammi Klippel   Encounter Date: 08/15/2019  PT End of Session - 08/15/19 1813    Visit Number  4    Number of Visits  10    Date for PT Re-Evaluation  10/04/19   eval 07/25/19   PT Start Time  1102    PT Stop Time  1210    PT Time Calculation (min)  68 min    Activity Tolerance  Patient tolerated treatment well    Behavior During Therapy  [facility] for tasks assessed/performed       Past Medical History:  Diagnosis Date  . Arthritis   . GERD (gastroesophageal reflux disease)   . Hyperlipidemia   . Left inguinal hernia 08/2018    Past Surgical History:  Procedure Laterality Date  . GREEN LIGHT LASER TURP (TRANSURETHRAL RESECTION OF PROSTATE N/A 03/05/2019   Procedure: GREEN LIGHT LASER TURP (TRANSURETHRAL RESECTION OF PROSTATE;  Surgeon: Royston Cowper, MD;  Location: [facility] ORS;  Service: Urology;  Laterality: N/A;  . [place]  . INGUINAL HERNIA REPAIR Left 09/07/2018   Procedure: OPEN LEFT HERNIA REPAIR - INGUINAL WITH MESH;  Surgeon: Fredirick Maudlin, MD;  Location: [facility] ORS;  Service: General;  Laterality: Left;  . JOINT REPLACEMENT Right 2017   Knee  . KNEE SURGERY Bilateral 2003  . TONSILLECTOMY AND ADENOIDECTOMY    . TOTAL KNEE ARTHROPLASTY Right 05/23/2016   Procedure: RIGHT TOTAL KNEE ARTHROPLASTY;  Surgeon: Gaynelle Arabian, MD;  Location: [facility];  Service: Orthopedics;  Laterality: Right;  . TOTAL KNEE ARTHROPLASTY Left 01/07/2019   Procedure: TOTAL KNEE ARTHROPLASTY;  Surgeon: Gaynelle Arabian, MD;  Location: [facility];  Service: Orthopedics;  Laterality: Left;  88min    There were no vitals filed for this visit.  Subjective Assessment - 08/15/19 1107    Subjective  Pt  climbed a ladder and pulled sheet rock repeatedly 4-5 x.  Several hours later or next day,pt noticed R groin pain . Pt also felt the groin pain with rolling over in bed.    Pertinent History  Hx of UTI after surgeries.  Hemmorrhoid surgeries 55 years ago.   L inguinal hernia 2020 Feb, L TKA 2020 Jun with only PT 3 sessions. L TKA end of June 2020 and had a UTI after the surgery. Pt was not able to empty his bladder completely because of enlarged prostate. Pt underwent laser sugery  Aug 2020  which helped his urine flow. Pt had been incontinent afterwards but he is doing his kegels now and it got better to 85%-90%.  Pt decreased diapers from 3 to 1 per day.         [facility] PT Assessment - 08/15/19 1115      Observation/Other Assessments   Observations  overuse of upper traps, rapid movements with new HEP, requiring cues for more scapular retraction/ depression , slower pace for neuromuscular re-education      Other:   Other/ Comments  simulated pulling up/ pulling sideways sheet rock : demo'd bilateral stance with torso twisting.     simulated tasks he is performing at home with removing sheet                  [facility] Adult PT Treatment/Exercise -  08/15/19 1809      Therapeutic Activites    Therapeutic Activities  Other Therapeutic Activities    Other Therapeutic Activities  assessed simulated tasks he is performing at home with removing sheet rock: pulling, pushing, lifting, hammering       Neuro Re-ed    Neuro Re-ed Details   excessive cues for alignemnt, technique to optimize deep core. lower kinetic chain in strengtehning exercises and simulated tasks  to minimize less train at abdominopelvic regiona                   PT Long Term Goals - 07/29/19 1104      PT LONG TERM GOAL #1   Title  Pt will demo decreased FOTO score of > 5 pts from PFDI Pelvic pain at 29 pts, PFDI Urinary 38 pts to indicate improved function and perform ADLs    Time  10    Period  Weeks     Status  New    Target Date  10/07/19      PT LONG TERM GOAL #2   Title  Pt will demo increased FOTO urinary score from 54 to > 59 pts to indicate improved function and be able to decrease from 1 diaper to small urinary pantyliner per day .    Time  8    Period  Weeks    Status  New    Target Date  09/23/19      PT LONG TERM GOAL #3   Title  Pt will demo increased lumbar mobility in order to progress to multifidis strengthening to perform lifting, pushing, pulling with less LBP    Time  4    Period  Weeks    Status  New    Target Date  08/26/19      PT LONG TERM GOAL #4   Title  Pt will demo more equal pelvic girdle height in standing with shoe lift , no tenderness at pubic symphysis L, and more levelled pubic symphysis across 2 visits in order to progress to deep core HEP and walk with less pain    Time  2    Period  Weeks    Status  New    Target Date  08/12/19      PT LONG TERM GOAL #5   Title  Pt walk > 1000 ft safely/ demo stability  without crutch and ascend/ stairs with safe mechanics / demo stability without crutch and report of < 2/10 pain, and increase gait speed at 0.67m/s without crutch to > 1.2 m/s without crutch   in order to ambulate in his home/ community  and not worsen spinal and pelvic deviations.    Time  4    Period  Weeks    Status  New    Target Date  08/26/19      Additional Long Term Goals   Additional Long Term Goals  Yes      PT LONG TERM GOAL #6   Title  Pt will demo IND and proper technique with deep core strengthening and coordination in order to increase intraabdominal pressure for postural stability and bowel movement elimination    Time  8    Period  Weeks    Status  New    Target Date  09/23/19            Plan - 08/15/19 1813    Clinical Impression Statement  Progressed pt to thoracolumbar and lower kinetic chain propioception/ strengthening  today. Selected exercises base don the movement patterns he uses when working with [employer]. Discussed pacing his return to previous activities to minimize strain to his hernial repair. Pt required cues to decrease overuse of upper traps, rapid movements with new HEP and to engage more scapular retraction/ depression , slower pace for neuromuscular re-education.  Plan to educate on kegel strengthening at next session . Pt continues to benefit from skilled PT.     Personal Factors and Comorbidities  Comorbidity 3+    Comorbidities  see multiple surgeries in medical Hx    Examination-Activity Limitations  Continence;Transfers;Lift;Stand;Locomotion Level    Stability/Clinical Decision Making  Evolving/Moderate complexity    Rehab Potential  Good    PT Frequency  1x / week    PT Duration  Other (comment)   10   PT Treatment/Interventions  Moist Heat;Stair training;Gait training;Functional mobility training;Therapeutic activities;Therapeutic exercise;Patient/family education;Neuromuscular re-education;Balance training;Scar mobilization;Manual techniques;Taping;Energy conservation;Spinal Manipulations;Joint Manipulations    Consulted and Agree with Plan of Care  Patient       Patient will benefit from skilled therapeutic intervention in order to improve the following deficits and impairments:  Decreased scar mobility, Decreased strength, Decreased endurance, Decreased range of motion, Decreased safety awareness, Difficulty walking, Hypomobility, Hypermobility, Impaired sensation, Improper body mechanics, Pain, Increased muscle spasms, Decreased mobility, Decreased coordination, Decreased balance, Postural dysfunction  Visit Diagnosis: Other abnormalities of gait and mobility  Difficulty in walking, not elsewhere classified  Other lack of coordination  Sacrococcygeal disorders, not elsewhere classified  Left knee pain, unspecified chronicity  Muscle weakness (generalized)  Stiffness of left knee, not elsewhere classified     Problem List Patient Active Problem List    Diagnosis Date Noted  . Abdominal wall pain 07/08/2019  . Pelvic pain in male 07/08/2019  . Recurrent lower abdominal pain 07/08/2019  . Degenerative disc disease, lumbar 07/08/2019  . Colonoscopy refused- at this time  07/08/2019  . Bilateral knee pain 06/24/2019  . Non-recurrent unilateral inguinal hernia without obstruction or gangrene 08/29/2018  . Osteoarthritis of left knee 05/23/2016  . Hearing loss 01/06/2015  . Chronic back pain 12/09/2014  . GERD (gastroesophageal reflux disease) 12/09/2014  . Obesity 12/09/2014  . PSA Elevation 12/09/2014  . Rosacea 12/09/2014  . Family history of malignant neoplasm of gastrointestinal tract 07/09/2008  . Lumbago 06/25/2007  . Low back pain 06/25/2007  . Diverticulosis of colon without hemorrhage 09/08/2006    Jerl Mina ,PT, DPT, E-RYT  08/15/2019, 6:14 PM  [place] MAIN [facility] SERVICES [address] [place], [place], [address] Phone: [phone]   Fax:  [phone]  Name: Alexander Moss MRN: [id number] Date of Birth: 12-28-1945

## 2019-08-22 ENCOUNTER — Other Ambulatory Visit: Payer: Self-pay

## 2019-08-22 ENCOUNTER — Ambulatory Visit: Payer: PPO | Admitting: Physical Therapy

## 2019-08-22 DIAGNOSIS — M25662 Stiffness of left knee, not elsewhere classified: Secondary | ICD-10-CM

## 2019-08-22 DIAGNOSIS — M25562 Pain in left knee: Secondary | ICD-10-CM

## 2019-08-22 DIAGNOSIS — R2689 Other abnormalities of gait and mobility: Secondary | ICD-10-CM

## 2019-08-22 DIAGNOSIS — M6281 Muscle weakness (generalized): Secondary | ICD-10-CM

## 2019-08-22 DIAGNOSIS — R278 Other lack of coordination: Secondary | ICD-10-CM

## 2019-08-22 DIAGNOSIS — R262 Difficulty in walking, not elsewhere classified: Secondary | ICD-10-CM

## 2019-08-22 DIAGNOSIS — M533 Sacrococcygeal disorders, not elsewhere classified: Secondary | ICD-10-CM

## 2019-08-22 NOTE — Patient Instructions (Addendum)
NEW today:  Scooting in bed,  Dig feet and elbows/hands as stabilizing points Do not lift the head  ___ Getting into and out of bed ( raise arm up past hea    Transition from standing to floor :  stand to floor transfer :      _ slow     _ mini squat      _ crawl down with one hand on thigh      _prop forearms on chair that is placed against the wall  - >  Mini squat, stretch and lengthen spine  shoulders down and back-      Floor to stand :   downward dog  on forearms  crawl hands back, butt is back, knees behind toes -> squat  Hands at waist , elbows back, chest lifts   _______\ Multifidis twist  SEATED ONE BLUE BAND  Band is on doorknob: stand further away from door (facing perpendicular)    Hold band at the level of ribcage, elbows bent,shoulder blades roll back and down like squeezing a pencil under armpit    Exhale twist,.10-15 deg away from door without moving your hips/ knees. Continue to maintain equal weight through legs. Keep knee unlocked.  10 x 2     _______  Past exercise CHANGES IN BOLD PRINT   , 2 times per day.   1) Open book ( ONLY ON R when lying on Left)  -15 reps     2)  Deep core level 1 ( 10 reps breaths) No pushing belly out, think wide breath inhale   Deep core level 2  ( 6 min)   Small knee movements 15 deg no rocking of hips     3)  clam shells    Clam Shell 45 Degrees Lying R side with hips and knees bent 45, one pillow between knees and ankles. Lift knee with exhale. Be sure pelvis does not roll backward. Do not arch back. Do 20 times, L leg     ___     Continue  1) Walk driveway n the morning and 15 min afternoon     2) Walking with band, body face away from door  Holding band with thumb pointed out  Take 3 steps slow,   backward stepping slow, slight lean,  Keep shoulders squeezing down and back  2 min      3) Lat pull downFacing the door   band on other side of the door knob  Mini squat  position   Squeeze shoulder blades together first,  Hold band with thumbs out Inhale, exhale, pull bands past your pockets  without lifting shoulders up  20 reps    4) Wall lean for scoliosis   R Forearm slide against wall as you stand perpendicular to wall, band under feet, feet hip width apart,  Opposite elbow by side, , imagine holding pencil under armpit as you lean toward wall, lowering forearm against the wall and opposite hand pulls band without letting elbow move away from side body  And slide back up , straightening body    Make sure the upper trapezius muscle does not hike up to ear as band is being pulled as you lean towards wall    band  20   Leaning To L only to open the R  flank area      HOLD OFF ON THE FOLLOWING EXERCISES THIS WEEK   1) R side bend  10reps     2)  Stop the wall lean in  And replace it with  Standing PNF band exercises  Stand 45 deg to doorway, front leg is opp of hand holding band   "Drawing a sword" " Pulling a lawnmover" Band is under feet, hold band in L hand, across body by R pocket  Press downward into the ballmounds and heels of the feet, thigh muscle active, Keep knees and hips squared the front and do not move them throughout the activity, Exhale to pull band from R pocket,  across the face to the R pocket, rotating only your ribcage to L as if "drawing a sword"   30 reps    3) seated "W"  Band under feet, elbow stays at ribs Chin tuck, inhale, exhale, pull  20 reps     Red band   Backward lunges with band at doorknob  2 mins . Elbows by ribs, shoulders down and back  Front knee in place above ankle, back foot and toes pointed forward, ( do not turn the hips and toes out) . Heel is up to be able to push off and return R foot next to the L at hip width apart  Carry your center with you as you step to maintain 50% weight in both legs.     One Blue Band : Stand perpendicular to the door  Side  Lunge position   Pulling away with outer hand holding band   Shoulders down. Elbow to the body Knee bends behind toes Leg closest tot he door still has pressure pushing into it   20 reps   ___
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
## 2019-08-28 ENCOUNTER — Other Ambulatory Visit: Payer: Self-pay

## 2019-08-28 ENCOUNTER — Ambulatory Visit: Payer: PPO | Admitting: Physical Therapy

## 2019-08-28 DIAGNOSIS — M25562 Pain in left knee: Secondary | ICD-10-CM

## 2019-08-28 DIAGNOSIS — R278 Other lack of coordination: Secondary | ICD-10-CM

## 2019-08-28 DIAGNOSIS — M25662 Stiffness of left knee, not elsewhere classified: Secondary | ICD-10-CM

## 2019-08-28 DIAGNOSIS — R2689 Other abnormalities of gait and mobility: Secondary | ICD-10-CM | POA: Diagnosis not present

## 2019-08-28 DIAGNOSIS — M533 Sacrococcygeal disorders, not elsewhere classified: Secondary | ICD-10-CM

## 2019-08-28 DIAGNOSIS — M6281 Muscle weakness (generalized): Secondary | ICD-10-CM

## 2019-08-28 DIAGNOSIS — R262 Difficulty in walking, not elsewhere classified: Secondary | ICD-10-CM
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
## 2019-08-28 NOTE — Patient Instructions (Signed)
3 x day   Deep core level 1  -inhale soft, ribs expand not pushing stomach  -exhale soft   10 quick pelvics  squeezes   Deep core 2 ( 6 min)    ___     __ hold off on long holds of the pelvic floor   ____  PELVIC FLOOR / KEGEL EXERCISES   Pelvic floor/ Kegel exercises are used to strengthen the muscles in the base of your pelvis that are responsible for supporting your pelvic organs and preventing urine/feces leakage. Based on your therapist's recommendations, they can be performed while standing, sitting, or lying down.  Make yourself aware of this muscle group by using these cues:  Imagine you are in a crowded room and you feel the need to pass gas. Your response is to pull up and in at the rectum.  Close the rectum. Pull the muscles up inside your body,feeling your scrotum lifting as well . Feel the pelvic floor muscles lift as if you were walking into a cold lake.  Place your hand on top of your pubic bone. Tighten and draw in the muscles around the anal muscles without squeezing the buttock muscles.  Common Errors:  Breath holding: If you are holding your breath, you may be bearing down against your bladder instead of pulling it up. If you belly bulges up while you are squeezing, you are holding your breath. Be sure to breathe gently in and out while exercising. Counting out loud may help you avoid holding your breath.  Accessory muscle use: You should not see or feel other muscle movement when performing pelvic floor exercises. When done properly, no one can tell that you are performing the exercises. Keep the buttocks, belly and inner thighs relaxed.  Overdoing it: Your muscles can fatigue and stop working for you if you over-exercise. You may actually leak more or feel soreness at the lower abdomen or rectum.  YOUR HOME EXERCISE PROGRAM    Quick squieeze : Position: on back  Inhale and then exhale. Then squeeze the muscle.  (Be sure to let belly sink in with  exhales and not push outward)  Perform 10 repetitions, 3  Times/day  **ALSO SQUEEZE BEFORE YOUR SNEEZE, COUGH, LAUGH to decrease downward pressure   **ALSO EXHALE BEFORE YOU RISE AGAINST GRAVITY (lifting, sit to stand, from squat to stand)

## 2019-08-29 ENCOUNTER — Encounter: Payer: PPO | Admitting: Physical Therapy

## 2019-09-05 ENCOUNTER — Ambulatory Visit: Payer: PPO | Admitting: Physical Therapy

## 2019-09-05 ENCOUNTER — Other Ambulatory Visit: Payer: Self-pay

## 2019-09-05 DIAGNOSIS — R278 Other lack of coordination: Secondary | ICD-10-CM

## 2019-09-05 DIAGNOSIS — R2689 Other abnormalities of gait and mobility: Secondary | ICD-10-CM

## 2019-09-05 DIAGNOSIS — R262 Difficulty in walking, not elsewhere classified: Secondary | ICD-10-CM

## 2019-09-05 DIAGNOSIS — M533 Sacrococcygeal disorders, not elsewhere classified: Secondary | ICD-10-CM

## 2019-09-05 DIAGNOSIS — M25562 Pain in left knee: Secondary | ICD-10-CM

## 2019-09-05 DIAGNOSIS — M25662 Stiffness of left knee, not elsewhere classified: Secondary | ICD-10-CM

## 2019-09-05 DIAGNOSIS — M6281 Muscle weakness (generalized): Secondary | ICD-10-CM

## 2019-09-05 NOTE — Therapy (Signed)
[place] MAIN [facility] SERVICES [address] [place], [place], [address] Phone: [phone]   Fax:  [phone]  Physical Therapy Treatment  Patient Details  Name: Alexander Moss MRN: [id number] Date of Birth: 09-04-1945 Referring Provider (PT): Tammi Klippel   Encounter Date: 09/05/2019  PT End of Session - 09/05/19 1409    Visit Number  7    Number of Visits  10    Date for PT Re-Evaluation  10/04/19   eval 07/25/19   PT Start Time  1100    PT Stop Time  1153    PT Time Calculation (min)  53 min    Activity Tolerance  Patient tolerated treatment well    Behavior During Therapy  [facility] for tasks assessed/performed       Past Medical History:  Diagnosis Date  . Arthritis   . GERD (gastroesophageal reflux disease)   . Hyperlipidemia   . Left inguinal hernia 08/2018    Past Surgical History:  Procedure Laterality Date  . GREEN LIGHT LASER TURP (TRANSURETHRAL RESECTION OF PROSTATE N/A 03/05/2019   Procedure: GREEN LIGHT LASER TURP (TRANSURETHRAL RESECTION OF PROSTATE;  Surgeon: Royston Cowper, MD;  Location: [facility] ORS;  Service: Urology;  Laterality: N/A;  . [place]  . INGUINAL HERNIA REPAIR Left 09/07/2018   Procedure: OPEN LEFT HERNIA REPAIR - INGUINAL WITH MESH;  Surgeon: Fredirick Maudlin, MD;  Location: [facility] ORS;  Service: General;  Laterality: Left;  . JOINT REPLACEMENT Right 2017   Knee  . KNEE SURGERY Bilateral 2003  . TONSILLECTOMY AND ADENOIDECTOMY    . TOTAL KNEE ARTHROPLASTY Right 05/23/2016   Procedure: RIGHT TOTAL KNEE ARTHROPLASTY;  Surgeon: Gaynelle Arabian, MD;  Location: [facility]S;  Service: Orthopedics;  Laterality: Right;  . TOTAL KNEE ARTHROPLASTY Left 01/07/2019   Procedure: TOTAL KNEE ARTHROPLASTY;  Surgeon: Gaynelle Arabian, MD;  Location: [facility];  Service: Orthopedics;  Laterality: Left;  67min    There were no vitals filed for this visit.  Subjective Assessment - 09/05/19 1108    Subjective  Pt  reported overall his pain is 50% better. The pain with rolling over decreased from 5/10 to 4/10 and is more manageable. Pt moved, lifted, put boxes into cars and did not have any repercussion of pain.    Pertinent History  Hx of UTI after surgeries.  Hemmorrhoid surgeries 55 years ago.   L inguinal hernia 2020 Feb, L TKA 2020 Jun with only PT 3 sessions. L TKA end of June 2020 and had a UTI after the surgery. Pt was not able to empty his bladder completely because of enlarged prostate. Pt underwent laser sugery  Aug 2020  which helped his urine flow. Pt had been incontinent afterwards but he is doing his kegels now and it got better to 85%-90%.  Pt decreased diapers from 3 to 1 per day.                    Pelvic Floor Special Questions - 09/05/19 1408    External Palpation  through clothing, excessive ab overuse with pelvic floor contractions, able to perform 5 sec, 5 rep      Abdominal fascial restrictions: no restrictions noted    [facility] Adult PT Treatment/Exercise - 09/05/19 1403      Neuro Re-ed    Neuro Re-ed Details   cued for proper pelvic floor activation without ab overuse. tactile and verbal cues . cued for more hip ext with new  HEP and co-activation of deep core mm                   PT Long Term Goals - 08/22/19 1113      PT LONG TERM GOAL #1   Title  Pt will demo decreased FOTO score of > 5 pts from PFDI Pelvic pain at 29 pts, PFDI Urinary 38 pts to indicate improved function and perform ADLs    Time  10    Period  Weeks    Status  On-going      PT LONG TERM GOAL #2   Title  Pt will demo increased FOTO urinary score from 54 to > 59 pts to indicate improved function and be able to decrease from 1 diaper to small urinary pantyliner per day .    Time  8    Period  Weeks    Status  On-going      PT LONG TERM GOAL #3   Title  Pt will demo increased lumbar mobility in order to progress to multifidis strengthening to perform lifting, pushing, pulling with  less LBP    Time  4    Period  Weeks    Status  On-going      PT LONG TERM GOAL #4   Title  Pt will demo more equal pelvic girdle height in standing with shoe lift , no tenderness at pubic symphysis L, and more levelled pubic symphysis across 2 visits in order to progress to deep core HEP and walk with less pain    Time  2    Period  Weeks    Status  Achieved      PT LONG TERM GOAL #5   Title  Pt walk > 1000 ft safely/ demo stability  without crutch and ascend/ stairs with safe mechanics / demo stability without crutch and report of < 2/10 pain, and increase gait speed at 0.54m/s without crutch to > 1.2 m/s without crutch   in order to ambulate in his home/ community  and not worsen spinal and pelvic deviations.    Time  4    Period  Weeks    Status  On-going      Additional Long Term Goals   Additional Long Term Goals  Yes      PT LONG TERM GOAL #6   Title  Pt will demo IND and proper technique with deep core strengthening and coordination in order to increase intraabdominal pressure for postural stability and bowel movement elimination    Time  8    Period  Weeks    Status  On-going      PT LONG TERM GOAL #7   Title  Pt will report not needing to get up in at night  to urinate for 3+ x week for 2 weeks in order to improved sleep quality    Time  8    Period  Weeks    Status  On-going    Target Date  10/17/19      PT LONG TERM GOAL  #10   TITLE  Pt will report no abdominal / pelvic pain with rolling B in bed in order to return to ADLs    Time  4    Period  Weeks    Status  New    Target Date  09/19/19            Plan - 09/05/19 1410    Clinical Impression Statement  Pt is making  positive improvements with report of ability to bend, lift, put boxes into car without increased pain. Pt also reports rolling over is less painful in the abdominal area. Pt demonstrates improved lower kinetic chain co-activation with new HEP and did not require as many cues for upright  posture but required cues for more hip extension. There are no more abdominal fascial restrictions. Pt was able to perform increased repetitions of long pelvic floor holds today but required more cues to increase awareness of pelvic floor mobility. Plan to advance functional strengthening HEP at next session.     Personal Factors and Comorbidities  Comorbidity 3+    Comorbidities  see multiple surgeries in medical Hx    Examination-Activity Limitations  Continence;Transfers;Lift;Stand;Locomotion Level    Stability/Clinical Decision Making  Evolving/Moderate complexity    Rehab Potential  Good    PT Frequency  1x / week    PT Duration  Other (comment)   10   PT Treatment/Interventions  Moist Heat;Stair training;Gait training;Functional mobility training;Therapeutic activities;Therapeutic exercise;Patient/family education;Neuromuscular re-education;Balance training;Scar mobilization;Manual techniques;Taping;Energy conservation;Spinal Manipulations;Joint Manipulations    Consulted and Agree with Plan of Care  Patient       Patient will benefit from skilled therapeutic intervention in order to improve the following deficits and impairments:  Decreased scar mobility, Decreased strength, Decreased endurance, Decreased range of motion, Decreased safety awareness, Difficulty walking, Hypomobility, Hypermobility, Impaired sensation, Improper body mechanics, Pain, Increased muscle spasms, Decreased mobility, Decreased coordination, Decreased balance, Postural dysfunction  Visit Diagnosis: Other abnormalities of gait and mobility  Difficulty in walking, not elsewhere classified  Other lack of coordination  Sacrococcygeal disorders, not elsewhere classified  Left knee pain, unspecified chronicity  Muscle weakness (generalized)  Stiffness of left knee, not elsewhere classified     Problem List Patient Active Problem List   Diagnosis Date Noted  . Abdominal wall pain 07/08/2019  . Pelvic pain  in male 07/08/2019  . Recurrent lower abdominal pain 07/08/2019  . Degenerative disc disease, lumbar 07/08/2019  . Colonoscopy refused- at this time  07/08/2019  . Bilateral knee pain 06/24/2019  . Non-recurrent unilateral inguinal hernia without obstruction or gangrene 08/29/2018  . Osteoarthritis of left knee 05/23/2016  . Hearing loss 01/06/2015  . Chronic back pain 12/09/2014  . GERD (gastroesophageal reflux disease) 12/09/2014  . Obesity 12/09/2014  . PSA Elevation 12/09/2014  . Rosacea 12/09/2014  . Family history of malignant neoplasm of gastrointestinal tract 07/09/2008  . Lumbago 06/25/2007  . Low back pain 06/25/2007  . Diverticulosis of colon without hemorrhage 09/08/2006    Jerl Mina ,PT, DPT, E-RYT  09/05/2019, 2:11 PM  [place] MAIN [facility] SERVICES [address] [place], [place], [address] Phone: [phone]   Fax:  [phone]  Name: Alexander Moss MRN: [id number] Date of Birth: [birth year]

## 2019-09-05 NOTE — Patient Instructions (Signed)
   WALKING WITH RESISTANCE BLUE Band at waist connected to doorknob 56mins Stepping forward normal length steps, planting mid and forefoot down, center of mass ( navel) leans forward slightly as if you were walking uphill 3-4 steps till band feels taut ( MAKE SURE THE DOOR IS LOCKED AND WON'T OPEN)   Stepping backwards, lower heel slowly, carry trunk and hips back as you step  __   Progress the pelvic floor to 5 sec 5 reps with 3 rest breaths    inhale soft sound, ribs expand not chest Exhale, squeeze not with abs  Focus on perineum     ___ Deep core level 1 and 2, Bring awareness to pelvic floor movement  Inhale - lowers like a bowel  exhale - lifts like an umbrella

## 2019-09-11 ENCOUNTER — Ambulatory Visit: Payer: PPO | Attending: Student | Admitting: Physical Therapy

## 2019-09-11 ENCOUNTER — Other Ambulatory Visit: Payer: Self-pay

## 2019-09-11 DIAGNOSIS — M25662 Stiffness of left knee, not elsewhere classified: Secondary | ICD-10-CM | POA: Insufficient documentation

## 2019-09-11 DIAGNOSIS — R278 Other lack of coordination: Secondary | ICD-10-CM | POA: Insufficient documentation

## 2019-09-11 DIAGNOSIS — M6281 Muscle weakness (generalized): Secondary | ICD-10-CM | POA: Diagnosis not present

## 2019-09-11 DIAGNOSIS — R262 Difficulty in walking, not elsewhere classified: Secondary | ICD-10-CM | POA: Diagnosis not present

## 2019-09-11 DIAGNOSIS — M25562 Pain in left knee: Secondary | ICD-10-CM | POA: Insufficient documentation

## 2019-09-11 DIAGNOSIS — R2689 Other abnormalities of gait and mobility: Secondary | ICD-10-CM | POA: Insufficient documentation

## 2019-09-11 DIAGNOSIS — M533 Sacrococcygeal disorders, not elsewhere classified: Secondary | ICD-10-CM | POA: Diagnosis not present

## 2019-09-11 NOTE — Patient Instructions (Addendum)
Applying Pelvic tilts:  Finding a comfortable position when laying on your back  Laying on your back, lift hips up, then scoot tail under, lowering ribs / midback first, then the low back Pillow under knees    Decreasing Low back pain:  Pelvic tilts Forward, Back, and Neutral   STANDING ( neutral is where you want to practice finding more and more often. More weight across the ball mound of feet and heels not only the heels, and not locking the knees.   SITTING: feet under knees, hip width apart. Weigh on the sitting bones. Thumb on the back iliac crest, Index finger at the front of the hip. Rock through 3 positions to find neutral, press in the feet and sense the sitting bones ( ischial tuberosity) in contact to the seat.   Posterior tilt ( thumb is lower)  Anterior tilt ( index finger is lower).   Neutral ( thumb and index finger is levelled)    Sitting at work chair that may have a dip in the seat Place folded towel/ blanket placed towards the back of the seat , sitting on sitting bones , don't lean to the back of the chair   ____  PELVIC FLOOR exercise program consists    Keep doing  1) Long pelvic floor exercises lying on your back  to 5 sec 5 reps with 3 rest breaths  X 3 x day     inhale soft sound, ribs expand not chest Exhale, squeeze not with abs  Focus on perineum    2) Quick squeezes seated after meals   X 3 x day  5 quick, make sure to breathe and inhale to lengthen before exhale to squeeze

## 2019-09-11 NOTE — Therapy (Signed)
[place] MAIN [facility] SERVICES [address] [place], [place], [address] Phone: [phone]   Fax:  [phone]  Physical Therapy Treatment  Patient Details  Name: Alexander Moss MRN: [id number] Date of Birth: 1945/10/01 Referring Provider (PT): Tammi Klippel   Encounter Date: 09/11/2019  PT End of Session - 09/11/19 1210    Visit Number  8    Number of Visits  10    Date for PT Re-Evaluation  10/04/19   eval 07/25/19   PT Start Time  [id number]    PT Stop Time  1253    PT Time Calculation (min)  48 min    Activity Tolerance  Patient tolerated treatment well    Behavior During Therapy  [facility] for tasks assessed/performed       Past Medical History:  Diagnosis Date  . Arthritis   . GERD (gastroesophageal reflux disease)   . Hyperlipidemia   . Left inguinal hernia 08/2018    Past Surgical History:  Procedure Laterality Date  . GREEN LIGHT LASER TURP (TRANSURETHRAL RESECTION OF PROSTATE N/A 03/05/2019   Procedure: GREEN LIGHT LASER TURP (TRANSURETHRAL RESECTION OF PROSTATE;  Surgeon: Royston Cowper, MD;  Location: [facility] ORS;  Service: Urology;  Laterality: N/A;  . [place]  . INGUINAL HERNIA REPAIR Left 09/07/2018   Procedure: OPEN LEFT HERNIA REPAIR - INGUINAL WITH MESH;  Surgeon: Fredirick Maudlin, MD;  Location: [facility] ORS;  Service: General;  Laterality: Left;  . JOINT REPLACEMENT Right 2017   Knee  . KNEE SURGERY Bilateral 2003  . TONSILLECTOMY AND ADENOIDECTOMY    . TOTAL KNEE ARTHROPLASTY Right 05/23/2016   Procedure: RIGHT TOTAL KNEE ARTHROPLASTY;  Surgeon: Gaynelle Arabian, MD;  Location: [facility];  Service: Orthopedics;  Laterality: Right;  . TOTAL KNEE ARTHROPLASTY Left 01/07/2019   Procedure: TOTAL KNEE ARTHROPLASTY;  Surgeon: Gaynelle Arabian, MD;  Location: [facility];  Service: Orthopedics;  Laterality: Left;  58min    There were no vitals filed for this visit.  Subjective Assessment - 09/11/19 1209    Subjective  Pt  reports he nolonger feels the abdominal pain when rolling over in bed , only when on the floor.  Overall the pain is at 3/10.    Pertinent History  Hx of UTI after surgeries.  Hemmorrhoid surgeries 55 years ago.   L inguinal hernia 2020 Feb, L TKA 2020 Jun with only PT 3 sessions. L TKA end of June 2020 and had a UTI after the surgery. Pt was not able to empty his bladder completely because of enlarged prostate. Pt underwent laser sugery  Aug 2020  which helped his urine flow. Pt had been incontinent afterwards but he is doing his kegels now and it got better to 85%-90%.  Pt decreased diapers from 3 to 1 per day.         [facility] PT Assessment - 09/11/19 1232      Coordination   Gross Motor Movements are Fluid and Coordinated  --   minor cues for pelvic tilts     Palpation   Spinal mobility  increased hypomobility at T3-10     Palpation comment  increased fascial restrictions on L low abdomen > R, and R anterior triangle of pelvic floor mm        Ambulation/Gait   Gait Comments  signficantly less trunk lean, more upright posture . minimal upper thoracic mobility/ rotation  Pelvic Floor Special Questions - 09/11/19 1234    External Palpation  through undergarment/ depends: R anterior triangle of pelvic floor mm with tightness ( post Tx improved mobility)           [facility] Adult PT Treatment/Exercise - 09/11/19 1235      Neuro Re-ed    Neuro Re-ed Details   cued for proper pelvic tilts with more co-activaiton of feet and application in all positions to increase awareness of pelvic floor mm       Modalities   Modalities  Moist Heat      Moist Heat Therapy   Number Minutes Moist Heat  5 Minutes    Moist Heat Location  Other (comment)   ab/ sacrum      Manual Therapy   Manual therapy comments  fascial release over B supra pubic area to release fascial restrictions, R ischicavernosus, deep transverse perineal / bulbo releases with MWM    PA mob Grade III T5-10                   PT Long Term Goals - 08/22/19 1113      PT LONG TERM GOAL #1   Title  Pt will demo decreased FOTO score of > 5 pts from PFDI Pelvic pain at 29 pts, PFDI Urinary 38 pts to indicate improved function and perform ADLs    Time  10    Period  Weeks    Status  On-going      PT LONG TERM GOAL #2   Title  Pt will demo increased FOTO urinary score from 54 to > 59 pts to indicate improved function and be able to decrease from 1 diaper to small urinary pantyliner per day .    Time  8    Period  Weeks    Status  On-going      PT LONG TERM GOAL #3   Title  Pt will demo increased lumbar mobility in order to progress to multifidis strengthening to perform lifting, pushing, pulling with less LBP    Time  4    Period  Weeks    Status  On-going      PT LONG TERM GOAL #4   Title  Pt will demo more equal pelvic girdle height in standing with shoe lift , no tenderness at pubic symphysis L, and more levelled pubic symphysis across 2 visits in order to progress to deep core HEP and walk with less pain    Time  2    Period  Weeks    Status  Achieved      PT LONG TERM GOAL #5   Title  Pt walk > 1000 ft safely/ demo stability  without crutch and ascend/ stairs with safe mechanics / demo stability without crutch and report of < 2/10 pain, and increase gait speed at 0.36m/s without crutch to > 1.2 m/s without crutch   in order to ambulate in his home/ community  and not worsen spinal and pelvic deviations.    Time  4    Period  Weeks    Status  On-going      Additional Long Term Goals   Additional Long Term Goals  Yes      PT LONG TERM GOAL #6   Title  Pt will demo IND and proper technique with deep core strengthening and coordination in order to increase intraabdominal pressure for postural stability and bowel movement elimination    Time  8  Period  Weeks    Status  On-going      PT LONG TERM GOAL #7   Title  Pt will report not needing to get up in at night  to urinate  for 3+ x week for 2 weeks in order to improved sleep quality    Time  8    Period  Weeks    Status  On-going    Target Date  10/17/19      PT LONG TERM GOAL  #10   TITLE  Pt will report no abdominal / pelvic pain with rolling B in bed in order to return to ADLs    Time  4    Period  Weeks    Status  New    Target Date  09/19/19            Plan - 09/11/19 1210    Clinical Impression Statement  Pt demo improved upright posture and less posterior rotation of R thorax 2/2 scoliosis. Pt no longer required cues for deep core coordination and pelvic floor activation. Pt's remaining areas of 3/10 low abdominal pain occurs still with rolling on the floor which is being addressed with manual Tx that is releasing fascial restrictions in this area and releasing R anterior pelvic floor tightness. Following Tx today, pt demo'd improve lengthening of pelvic floor mm and  progressed to seated quick pelvic floor contractions today.  Pt required cues for pelvic tilt. Anticipate pt will gain continence with these improvements. 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  Pt continues to benefit from skilled PT.    Personal Factors and Comorbidities  Comorbidity 3+    Comorbidities  see multiple surgeries in medical Hx    Examination-Activity Limitations  Continence;Transfers;Lift;Stand;Locomotion Level    Stability/Clinical Decision Making  Evolving/Moderate complexity    Rehab Potential  Good    PT Frequency  1x / week    PT Duration  Other (comment)   10   PT Treatment/Interventions  Moist Heat;Stair training;Gait training;Functional mobility training;Therapeutic activities;Therapeutic exercise;Patient/family education;Neuromuscular re-education;Balance training;Scar mobilization;Manual techniques;Taping;Energy conservation;Spinal Manipulations;Joint Manipulations    Consulted and Agree with Plan of Care  Patient        Patient will benefit from skilled therapeutic intervention in order to improve the following deficits and impairments:  Decreased scar mobility, Decreased strength, Decreased endurance, Decreased range of motion, Decreased safety awareness, Difficulty walking, Hypomobility, Hypermobility, Impaired sensation, Improper body mechanics, Pain, Increased muscle spasms, Decreased mobility, Decreased coordination, Decreased balance, Postural dysfunction  Visit Diagnosis: Difficulty in walking, not elsewhere classified  Other abnormalities of gait and mobility  Other lack of coordination  Sacrococcygeal disorders, not elsewhere classified  Muscle weakness (generalized)  Left knee pain, unspecified chronicity  Stiffness of left knee, not elsewhere classified     Problem List Patient Active Problem List   Diagnosis Date Noted  . Abdominal wall pain 07/08/2019  . Pelvic pain in male 07/08/2019  . Recurrent lower abdominal pain 07/08/2019  . Degenerative disc disease, lumbar 07/08/2019  . Colonoscopy refused- at this time  07/08/2019  . Bilateral knee pain 06/24/2019  . Non-recurrent unilateral inguinal hernia without obstruction or gangrene 08/29/2018  . Osteoarthritis of left knee 05/23/2016  . Hearing loss 01/06/2015  . Chronic back pain 12/09/2014  . GERD (gastroesophageal reflux disease) 12/09/2014  . Obesity 12/09/2014  . PSA Elevation 12/09/2014  . Rosacea 12/09/2014  . Family history of malignant neoplasm  of gastrointestinal tract 07/09/2008  . Lumbago 06/25/2007  . Low back pain 06/25/2007  . Diverticulosis of colon without hemorrhage 09/08/2006    Jerl Mina 09/11/2019, 12:57 PM  [place] MAIN [facility] SERVICES [address] [place], [place], [address] Phone: [phone]   Fax:  [phone]  Name: TORREY PICCINI MRN: [id number] Date of Birth: 10-23-1945

## 2019-09-25 ENCOUNTER — Ambulatory Visit: Payer: PPO | Admitting: Physical Therapy

## 2019-09-25 ENCOUNTER — Other Ambulatory Visit: Payer: Self-pay

## 2019-09-25 DIAGNOSIS — M25662 Stiffness of left knee, not elsewhere classified: Secondary | ICD-10-CM

## 2019-09-25 DIAGNOSIS — R278 Other lack of coordination: Secondary | ICD-10-CM

## 2019-09-25 DIAGNOSIS — M533 Sacrococcygeal disorders, not elsewhere classified: Secondary | ICD-10-CM

## 2019-09-25 DIAGNOSIS — R2689 Other abnormalities of gait and mobility: Secondary | ICD-10-CM

## 2019-09-25 DIAGNOSIS — M6281 Muscle weakness (generalized): Secondary | ICD-10-CM

## 2019-09-25 DIAGNOSIS — M25562 Pain in left knee: Secondary | ICD-10-CM

## 2019-09-25 DIAGNOSIS — R262 Difficulty in walking, not elsewhere classified: Secondary | ICD-10-CM

## 2019-09-25 NOTE — Patient Instructions (Addendum)
Maintain good water intake  _make chart  _ensure 48- 64 fl oz for this week    ____

## 2019-09-26 NOTE — Addendum Note (Signed)
Addended by: Jerl Mina on: 09/26/2019 04:04 PM   Modules accepted: Orders

## 2019-09-26 NOTE — Therapy (Addendum)
[place] MAIN [facility] SERVICES [address] [place], [place], [address] Phone: [phone]   Fax:  [phone]  Physical Therapy Treatment / Progress Note reporting from 07/25/19 to 09/25/19   Patient Details  Name: Alexander Moss MRN: [id number] Date of Birth: 26-Feb-1946 Referring Provider (PT): Tammi Klippel   Encounter Date: 09/25/2019  PT End of Session - 09/25/19 1220    Visit Number  9    Number of Visits  --    Date for PT Re-Evaluation  12/05/19   eval 07/25/19   PT Start Time  1205    PT Stop Time  1306    PT Time Calculation (min)  61 min    Activity Tolerance  Patient tolerated treatment well    Behavior During Therapy  [facility] for tasks assessed/performed       Past Medical History:  Diagnosis Date  . Arthritis   . GERD (gastroesophageal reflux disease)   . Hyperlipidemia   . Left inguinal hernia 08/2018    Past Surgical History:  Procedure Laterality Date  . GREEN LIGHT LASER TURP (TRANSURETHRAL RESECTION OF PROSTATE N/A 03/05/2019   Procedure: GREEN LIGHT LASER TURP (TRANSURETHRAL RESECTION OF PROSTATE;  Surgeon: Royston Cowper, MD;  Location: [facility] ORS;  Service: Urology;  Laterality: N/A;  . [place]  . INGUINAL HERNIA REPAIR Left 09/07/2018   Procedure: OPEN LEFT HERNIA REPAIR - INGUINAL WITH MESH;  Surgeon: Fredirick Maudlin, MD;  Location: [facility] ORS;  Service: General;  Laterality: Left;  . JOINT REPLACEMENT Right 2017   Knee  . KNEE SURGERY Bilateral 2003  . TONSILLECTOMY AND ADENOIDECTOMY    . TOTAL KNEE ARTHROPLASTY Right 05/23/2016   Procedure: RIGHT TOTAL KNEE ARTHROPLASTY;  Surgeon: Gaynelle Arabian, MD;  Location: [facility];  Service: Orthopedics;  Laterality: Right;  . TOTAL KNEE ARTHROPLASTY Left 01/07/2019   Procedure: TOTAL KNEE ARTHROPLASTY;  Surgeon: Gaynelle Arabian, MD;  Location: [facility];  Service: Orthopedics;  Laterality: Left;  28mn    There were no vitals filed for this  visit.  Subjective Assessment - 09/25/19 1208    Subjective  Pt reports he has not worn Depends nor panty liners  for 2 weeks.  Pt notices very little leakage.  Pt has noticed no abdominal pain with all activities incluing climbing ladders, deconstructing home. Pt only feels the same pain in the upper L inguinal scar and lower above pubic on both sides with rolling on the floor from side to back and to sidelying .  Pt reports it seems he is going to the bathroom once every hour at night. Pt is drinking water when he feels thirsty ( 20 oz).    Pertinent History  Hx of UTI after surgeries.  Hemmorrhoid surgeries 55 years ago.   L inguinal hernia 2020 Feb, L TKA 2020 Jun with only PT 3 sessions. L TKA end of June 2020 and had a UTI after the surgery. Pt was not able to empty his bladder completely because of enlarged prostate. Pt underwent laser sugery  Aug 2020  which helped his urine flow. Pt had been incontinent afterwards but he is doing his kegels now and it got better to 85%-90%.  Pt decreased diapers from 3 to 1 per day.         [facility] - [facility]      Palpation   Palpation comment  suprapubic and inguinal crease fascial restrictions       Ambulation/Gait  Gait Comments  upright posture, no throacic kyphosis, minimal trunk lean                    [facility] Adult PT Treatment/Exercise - 09/26/19 1458      Therapeutic Activites    Other Therapeutic Activities  reassessed goals, discussed the continuation of PT for a few more sessions to go over fitness exercises that pt used to do  to minimize relapse of Sx    education on water intake to prevent UTI, frequency     Modalities   Modalities  Moist Heat      Moist Heat Therapy   Moist Heat Location  Other (comment)   abdomen     Manual Therapy   Manual therapy comments  fascial release over B supra pubic and inguinal crease ( proximal scar )  area to release fascial restrictions   PA mob Grade III T5-10                   PT Long Term Goals - 09/25/19 1212      PT LONG TERM GOAL #1   Title  Pt will demo decreased FOTO score of > 5 pts from PFDI Pelvic pain at 29 pts, PFDI Urinary 38 pts to indicate improved function and perform ADLs  ( 09/25/19:  PFDI 38 pts --> 4 pts, Pain 29 pts ---> 4 pts )    Time  10    Period  Weeks    Status  Achieved      PT LONG TERM GOAL #2   Title  Pt will demo increased FOTO urinary score from 54 to > 59 pts to indicate improved function and be able to decrease from 1 diaper to small urinary pantyliner per day .  ( 09/25/19: 68 pts)    Time  8    Period  Weeks    Status  Achieved      PT LONG TERM GOAL #3   Title  Pt will demo increased lumbar mobility in order to progress to multifidis strengthening to perform lifting, pushing, pulling with less LBP    Time  4    Period  Weeks    Status  Achieved      PT LONG TERM GOAL #4   Title  Pt will demo more equal pelvic girdle height in standing with shoe lift , no tenderness at pubic symphysis L, and more levelled pubic symphysis across 2 visits in order to progress to deep core HEP and walk with less pain    Time  2    Period  Weeks    Status  Achieved      PT LONG TERM GOAL #5   Title  Pt walk > 1000 ft safely/ demo stability  without crutch and ascend/ stairs with safe mechanics / demo stability without crutch and report of < 2/10 pain, and increase gait speed at 0.65ms without crutch to > 1.2 m/s without crutch   in order to ambulate in his home/ community  and not worsen spinal and pelvic deviations.    Time  4    Period  Weeks    Status  Achieved      PT LONG TERM GOAL #6   Title  Pt will demo IND and proper technique with deep core strengthening and coordination in order to increase intraabdominal pressure for postural stability and bowel movement elimination    Time  8    Period  Weeks  Status  Achieved      PT LONG TERM GOAL #7   Title  Pt will report not needing to get up in at night   to urinate for 3+ x week for 2 weeks in order to improved sleep quality    Time  8    Period  Weeks    Status  On-going      PT LONG TERM GOAL  #10   TITLE  Pt will report no abdominal / pelvic pain with rolling B in bed in order to return to ADLs    Time  4    Period  Weeks    Status  Progressing     PT LONG TERM GOAL  #11   TITLE  Pt will demo proper technique . alternatives with the fitness exercises he used to do prior to abdominal pain and surgeries in order to minimize relapse of Sx and icnreased load on pelvic floor mm    Time  10    Period  Weeks    Status  New    Target Date  12/05/19            Plan - 09/25/19 1238    Clinical Impression Statement Pt has met 9/11 goals and is progressing towards remaining goals.   Improvements include: 1) functional gains:  Pt reports he has not worn Depends nor panty liners  for 2 weeks.  Pt notices very little leakage.  Pt has noticed no abdominal and knee pain with all activities including walking, climbing ladders, deconstructing home. Pt only feels the same pain in the upper L inguinal scar and lower above pubic on both sides with rolling on the floor from side to back and to sidelying .  2) functional scores: PFDI 38 pts --> 4 pts, Pain 29 pts. Based on the [facility], pt reported a "A Very Great Deal Better" with his abdominal pain and urinary leakage.   Remaining sessions will focus on: 1)  helping pt decrease abdominal/ inguinal scars to be able to roll on side with no pain  when on the floor performing exercises  2) educating and providing alternatives and technique instruction to fitness exercises he used to prior to pain onset and surgeries to help minimize relapse of Sx/ bearing down on abdominopelvic area.   Pt continues to benefit from skilled PT.         Personal Factors and Comorbidities  Comorbidity 3+    Comorbidities  see multiple surgeries in medical Hx    Examination-Activity Limitations   Continence;Transfers;Lift;Stand;Locomotion Level    Stability/Clinical Decision Making  Evolving/Moderate complexity    Rehab Potential  Good    PT Frequency  1x / week    PT Duration  Other (comment)   10   PT Treatment/Interventions  Moist Heat;Stair training;Gait training;Functional mobility training;Therapeutic activities;Therapeutic exercise;Patient/family education;Neuromuscular re-education;Balance training;Scar mobilization;Manual techniques;Taping;Energy conservation;Spinal Manipulations;Joint Manipulations    Consulted and Agree with Plan of Care  Patient       Patient will benefit from skilled therapeutic intervention in order to improve the following deficits and impairments:  Decreased scar mobility, Decreased strength, Decreased endurance, Decreased range of motion, Decreased safety awareness, Difficulty walking, Hypomobility, Hypermobility, Impaired sensation, Improper body mechanics, Pain, Increased muscle spasms, Decreased mobility, Decreased coordination, Decreased balance, Postural dysfunction  Visit Diagnosis: Difficulty in walking, not elsewhere classified  Sacrococcygeal disorders, not elsewhere classified  Other abnormalities of gait and mobility  Other lack of coordination  Muscle weakness (generalized)  Left knee  pain, unspecified chronicity  Stiffness of left knee, not elsewhere classified     Problem List Patient Active Problem List   Diagnosis Date Noted  . Abdominal wall pain 07/08/2019  . Pelvic pain in male 07/08/2019  . Recurrent lower abdominal pain 07/08/2019  . Degenerative disc disease, lumbar 07/08/2019  . Colonoscopy refused- at this time  07/08/2019  . Bilateral knee pain 06/24/2019  . Non-recurrent unilateral inguinal hernia without obstruction or gangrene 08/29/2018  . Osteoarthritis of left knee 05/23/2016  . Hearing loss 01/06/2015  . Chronic back pain 12/09/2014  . GERD (gastroesophageal reflux disease) 12/09/2014  . Obesity  12/09/2014  . PSA Elevation 12/09/2014  . Rosacea 12/09/2014  . Family history of malignant neoplasm of gastrointestinal tract 07/09/2008  . Lumbago 06/25/2007  . Low back pain 06/25/2007  . Diverticulosis of colon without hemorrhage 09/08/2006    Jerl Mina ,PT, DPT, E-RYT  09/26/2019, 3:49 PM  [place] MAIN [facility] SERVICES [address] [place], [place], [address] Phone: [phone]   Fax:  [phone]  Name: ARHAM SYMMONDS MRN: [id number] Date of Birth: October 23, 1945

## 2019-10-02 ENCOUNTER — Ambulatory Visit: Payer: PPO | Admitting: Physical Therapy

## 2019-10-02 ENCOUNTER — Other Ambulatory Visit: Payer: Self-pay

## 2019-10-02 DIAGNOSIS — R262 Difficulty in walking, not elsewhere classified: Secondary | ICD-10-CM | POA: Diagnosis not present

## 2019-10-02 DIAGNOSIS — R2689 Other abnormalities of gait and mobility: Secondary | ICD-10-CM

## 2019-10-02 DIAGNOSIS — R278 Other lack of coordination: Secondary | ICD-10-CM

## 2019-10-02 DIAGNOSIS — M6281 Muscle weakness (generalized): Secondary | ICD-10-CM

## 2019-10-02 DIAGNOSIS — M25662 Stiffness of left knee, not elsewhere classified: Secondary | ICD-10-CM

## 2019-10-02 DIAGNOSIS — M25562 Pain in left knee: Secondary | ICD-10-CM

## 2019-10-02 DIAGNOSIS — M533 Sacrococcygeal disorders, not elsewhere classified: Secondary | ICD-10-CM

## 2019-10-02 NOTE — Patient Instructions (Addendum)
Green band loop  Elbows by ribs   Side step L, stretch band to the L without moving elbow Side step R, stretch band to the R without moving elbow  1 min  ___  Doorway fist push , don't bend wrists  30 sec   ___  Alexander Moss loops at doorknob  Lunge position Chest rows alternating  1 min with R foot forward,  1 min with L foot forward   ___   Standing opp elbow to knee  30 sec  Rest  X 3 sets  ____  Lying on back, knees bent    band under ballmounds  while laying on back w/ knees bent  "W" exercise  10 reps x 2 sets   Band is placed under feet, knees bent, feet are hip width apart Hold band with thumbs point out, keep upper arm and elbow touching the bed the whole time  - inhale and then exhale pull bands by bending elbows hands move in a "w"  (feel shoulder blades squeezing)    __________________________  Oblique/ scapula stabilization   Opposite arm   Place band in "U"    band under ballmounds  while laying on back w/ knees bent     20 reps  on each side  Holding band from opposite thigh,  Inhale,    exhale then pull band across body while keeping elbow , shoulders, back of the head pressed down

## 2019-10-03 NOTE — Therapy (Signed)
[place] MAIN [facility] SERVICES [address] [place], [place], [address] Phone: [phone]   Fax:  [phone]  Physical Therapy Treatment  Patient Details  Name: Alexander Moss MRN: [id number] Date of Birth: 05/18/1946 Referring Provider (PT): Tammi Klippel   Encounter Date: 10/02/2019  PT End of Session - 10/02/19 1246    Visit Number  10    Date for PT Re-Evaluation  12/05/19   PN 09/25/19   PT Start Time  1100    PT Stop Time  1155    PT Time Calculation (min)  55 min    Activity Tolerance  Patient tolerated treatment well    Behavior During Therapy  [facility] for tasks assessed/performed       Past Medical History:  Diagnosis Date  . Arthritis   . GERD (gastroesophageal reflux disease)   . Hyperlipidemia   . Left inguinal hernia 08/2018    Past Surgical History:  Procedure Laterality Date  . GREEN LIGHT LASER TURP (TRANSURETHRAL RESECTION OF PROSTATE N/A 03/05/2019   Procedure: GREEN LIGHT LASER TURP (TRANSURETHRAL RESECTION OF PROSTATE;  Surgeon: Royston Cowper, MD;  Location: [facility] ORS;  Service: Urology;  Laterality: N/A;  . [place]  . INGUINAL HERNIA REPAIR Left 09/07/2018   Procedure: OPEN LEFT HERNIA REPAIR - INGUINAL WITH MESH;  Surgeon: Fredirick Maudlin, MD;  Location: [facility] ORS;  Service: General;  Laterality: Left;  . JOINT REPLACEMENT Right 2017   Knee  . KNEE SURGERY Bilateral 2003  . TONSILLECTOMY AND ADENOIDECTOMY    . TOTAL KNEE ARTHROPLASTY Right 05/23/2016   Procedure: RIGHT TOTAL KNEE ARTHROPLASTY;  Surgeon: Gaynelle Arabian, MD;  Location: [facility];  Service: Orthopedics;  Laterality: Right;  . TOTAL KNEE ARTHROPLASTY Left 01/07/2019   Procedure: TOTAL KNEE ARTHROPLASTY;  Surgeon: Gaynelle Arabian, MD;  Location: [facility];  Service: Orthopedics;  Laterality: Left;  33mn    There were no vitals filed for this visit.  Subjective Assessment - 10/02/19 1212    Subjective  Pt reports not wearing Depends  for the past 3 weeks. Pt continues to do house renovation projects. Pt would like to learn fitness exercises. Pt used to use dumbbells while laying onthe floor chest rows and flies.    Pertinent History  Hx of UTI after surgeries.  Hemmorrhoid surgeries 55 years ago.   L inguinal hernia 2020 Feb, L TKA 2020 Jun with only PT 3 sessions. L TKA end of June 2020 and had a UTI after the surgery. Pt was not able to empty his bladder completely because of enlarged prostate. Pt underwent laser sugery  Aug 2020  which helped his urine flow. Pt had been incontinent afterwards but he is doing his kegels now and it got better to 85%-90%.  Pt decreased diapers from 3 to 1 per day.         [facility] 10[facility] 1446      Coordination   Gross Motor Movements are Fluid and Coordinated  --   scapular retraction/ depression with less cues      Other:   Other/ Comments  cued more scapular retraction with RTC exercises                    [facility] Adult PT Treatment/Exercise - 10/03/19 1445      Therapeutic Activites    Other Therapeutic Activities  discussed fitness routine and modifications for pelvic health with fitness and work activities  Neuro Re-ed    Neuro Re-ed Details   cued for new strengthening exercise to promote RTC strength/ scapular stabilization, aerobic health                   PT Long Term Goals - 09/25/19 1212      PT LONG TERM GOAL #1   Title  Pt will demo decreased FOTO score of > 5 pts from PFDI Pelvic pain at 29 pts, PFDI Urinary 38 pts to indicate improved function and perform ADLs  ( 09/25/19:  PFDI 38 pts --> 4 pts, Pain 29 pts ---> 4 pts )    Time  10    Period  Weeks    Status  Achieved      PT LONG TERM GOAL #2   Title  Pt will demo increased FOTO urinary score from 54 to > 59 pts to indicate improved function and be able to decrease from 1 diaper to small urinary pantyliner per day .  ( 09/25/19: 68 pts)    Time  8    Period  Weeks     Status  Achieved      PT LONG TERM GOAL #3   Title  Pt will demo increased lumbar mobility in order to progress to multifidis strengthening to perform lifting, pushing, pulling with less LBP    Time  4    Period  Weeks    Status  Achieved      PT LONG TERM GOAL #4   Title  Pt will demo more equal pelvic girdle height in standing with shoe lift , no tenderness at pubic symphysis L, and more levelled pubic symphysis across 2 visits in order to progress to deep core HEP and walk with less pain    Time  2    Period  Weeks    Status  Achieved      PT LONG TERM GOAL #5   Title  Pt walk > 1000 ft safely/ demo stability  without crutch and ascend/ stairs with safe mechanics / demo stability without crutch and report of < 2/10 pain, and increase gait speed at 0.34ms without crutch to > 1.2 m/s without crutch   in order to ambulate in his home/ community  and not worsen spinal and pelvic deviations.    Time  4    Period  Weeks    Status  Achieved      PT LONG TERM GOAL #6   Title  Pt will demo IND and proper technique with deep core strengthening and coordination in order to increase intraabdominal pressure for postural stability and bowel movement elimination    Time  8    Period  Weeks    Status  Achieved      PT LONG TERM GOAL #7   Title  Pt will report not needing to get up in at night  to urinate for 3+ x week for 2 weeks in order to improved sleep quality    Time  8    Period  Weeks    Status  On-going      PT LONG TERM GOAL  #10   TITLE  Pt will report no abdominal / pelvic pain with rolling B in bed in order to return to ADLs    Time  4    Period  Weeks    Status  Not Met      PT LONG TERM GOAL  #11   TITLE  Pt will  demo proper technique . alternatives with the fitness exercises he used to do prior to abdominal pain and surgeries in order to minimize relapse of Sx and icnreased load on pelvic floor mm    Time  10    Period  Weeks    Status  New    Target Date  12/05/19             Plan - 10/02/19 1247    Clinical Impression Statement Pt remains continent and no longer relies on wearing Depends.  Pt progressed well to resistance band strengthening for rotator cuff, thoracic extension, hips, overall conditioning to help pt maintain strength to perform home renovations with his house and [employer].  Educated pt on how to select functional exercises instead of his previously selected exercises with dumbbells or sit up/ crunches to minimize relapse of abdominal /pelvic/ groin pain and minimize incontinence. Pt required minor cues for proper technique and alignment in new HEP. Pt continues to benefit from skilled PT   Personal Factors and Comorbidities  Comorbidity 3+    Comorbidities  see multiple surgeries in medical Hx    Examination-Activity Limitations  Continence;Transfers;Lift;Stand;Locomotion Level    Stability/Clinical Decision Making  Evolving/Moderate complexity    Rehab Potential  Good    PT Frequency  1x / week    PT Duration  Other (comment)   10   PT Treatment/Interventions  Moist Heat;Stair training;Gait training;Functional mobility training;Therapeutic activities;Therapeutic exercise;Patient/family education;Neuromuscular re-education;Balance training;Scar mobilization;Manual techniques;Taping;Energy conservation;Spinal Manipulations;Joint Manipulations    Consulted and Agree with Plan of Care  Patient       Patient will benefit from skilled therapeutic intervention in order to improve the following deficits and impairments:  Decreased scar mobility, Decreased strength, Decreased endurance, Decreased range of motion, Decreased safety awareness, Difficulty walking, Hypomobility, Hypermobility, Impaired sensation, Improper body mechanics, Pain, Increased muscle spasms, Decreased mobility, Decreased coordination, Decreased balance, Postural dysfunction  Visit Diagnosis: Sacrococcygeal disorders, not elsewhere classified  Difficulty in  walking, not elsewhere classified  Other abnormalities of gait and mobility  Other lack of coordination  Muscle weakness (generalized)  Left knee pain, unspecified chronicity  Stiffness of left knee, not elsewhere classified     Problem List Patient Active Problem List   Diagnosis Date Noted  . Abdominal wall pain 07/08/2019  . Pelvic pain in male 07/08/2019  . Recurrent lower abdominal pain 07/08/2019  . Degenerative disc disease, lumbar 07/08/2019  . Colonoscopy refused- at this time  07/08/2019  . Bilateral knee pain 06/24/2019  . Non-recurrent unilateral inguinal hernia without obstruction or gangrene 08/29/2018  . Osteoarthritis of left knee 05/23/2016  . Hearing loss 01/06/2015  . Chronic back pain 12/09/2014  . GERD (gastroesophageal reflux disease) 12/09/2014  . Obesity 12/09/2014  . PSA Elevation 12/09/2014  . Rosacea 12/09/2014  . Family history of malignant neoplasm of gastrointestinal tract 07/09/2008  . Lumbago 06/25/2007  . Low back pain 06/25/2007  . Diverticulosis of colon without hemorrhage 09/08/2006    Jerl Mina ,PT, DPT, E-RYT  10/03/2019, 2:58 PM  [place] MAIN [facility] SERVICES [address] [place], [place], [address] Phone: [phone]   Fax:  [phone]  Name: BODE PIEPER MRN: [id number] Date of Birth: September 06, 1945

## 2019-10-07 ENCOUNTER — Ambulatory Visit: Payer: PPO | Admitting: Physical Therapy

## 2019-10-07 ENCOUNTER — Other Ambulatory Visit: Payer: Self-pay

## 2019-10-07 DIAGNOSIS — M533 Sacrococcygeal disorders, not elsewhere classified: Secondary | ICD-10-CM

## 2019-10-07 DIAGNOSIS — M25662 Stiffness of left knee, not elsewhere classified: Secondary | ICD-10-CM

## 2019-10-07 DIAGNOSIS — R262 Difficulty in walking, not elsewhere classified: Secondary | ICD-10-CM | POA: Diagnosis not present

## 2019-10-07 DIAGNOSIS — R278 Other lack of coordination: Secondary | ICD-10-CM

## 2019-10-07 DIAGNOSIS — M6281 Muscle weakness (generalized): Secondary | ICD-10-CM

## 2019-10-07 DIAGNOSIS — R2689 Other abnormalities of gait and mobility: Secondary | ICD-10-CM

## 2019-10-07 DIAGNOSIS — M25562 Pain in left knee: Secondary | ICD-10-CM

## 2019-10-07 NOTE — Therapy (Signed)
[place] MAIN [facility] SERVICES [address] [place], [place], [address] Phone: [phone]   Fax:  [phone]  Physical Therapy Treatment / Discharge Summary   Patient Details  Name: Alexander Moss MRN: [id number] Date of Birth: Aug 04, 1945 Referring Provider (PT): Tammi Klippel   Encounter Date: 10/07/2019  PT End of Session - 10/07/19 1506    Visit Number  11    Date for PT Re-Evaluation  12/05/19   PN 09/25/19   PT Start Time  1000    PT Stop Time  1056    PT Time Calculation (min)  56 min    Activity Tolerance  Patient tolerated treatment well    Behavior During Therapy  [facility] for tasks assessed/performed       Past Medical History:  Diagnosis Date  . Arthritis   . GERD (gastroesophageal reflux disease)   . Hyperlipidemia   . Left inguinal hernia 08/2018    Past Surgical History:  Procedure Laterality Date  . GREEN LIGHT LASER TURP (TRANSURETHRAL RESECTION OF PROSTATE N/A 03/05/2019   Procedure: GREEN LIGHT LASER TURP (TRANSURETHRAL RESECTION OF PROSTATE;  Surgeon: Royston Cowper, MD;  Location: [facility] ORS;  Service: Urology;  Laterality: N/A;  . [place]  . INGUINAL HERNIA REPAIR Left 09/07/2018   Procedure: OPEN LEFT HERNIA REPAIR - INGUINAL WITH MESH;  Surgeon: Fredirick Maudlin, MD;  Location: [facility] ORS;  Service: General;  Laterality: Left;  . JOINT REPLACEMENT Right 2017   Knee  . KNEE SURGERY Bilateral 2003  . TONSILLECTOMY AND ADENOIDECTOMY    . TOTAL KNEE ARTHROPLASTY Right 05/23/2016   Procedure: RIGHT TOTAL KNEE ARTHROPLASTY;  Surgeon: Gaynelle Arabian, MD;  Location: [facility] ORS;  Service: Orthopedics;  Laterality: Right;  . TOTAL KNEE ARTHROPLASTY Left 01/07/2019   Procedure: TOTAL KNEE ARTHROPLASTY;  Surgeon: Gaynelle Arabian, MD;  Location: [facility] ORS;  Service: Orthopedics;  Laterality: Left;  39mn    There were no vitals filed for this visit.  Subjective Assessment - 10/07/19 1012    Subjective  Pt reports  he thinks he overdid it with chainswing wood , lifting wood, and putting logs in the truck and then doing the new exercises. Pt felt increased pain afterwards.    Pertinent History  Hx of UTI after surgeries.  Hemmorrhoid surgeries 55 years ago.   L inguinal hernia 2020 Feb, L TKA 2020 Jun with only PT 3 sessions. L TKA end of June 2020 and had a UTI after the surgery. Pt was not able to empty his bladder completely because of enlarged prostate. Pt underwent laser sugery  Aug 2020  which helped his urine flow. Pt had been incontinent afterwards but he is doing his kegels now and it got better to 85%-90%.  Pt decreased diapers from 3 to 1 per day.         [facility] 10/[facility] 1021      Other:   Other/ Comments  simulated lifting wood from ground to truck. and demo'd no pivot turns                    [facility] - 10/07/19 1507      Therapeutic Activites    Other Therapeutic Activities  explained body mechanics and adjusting loaded activities to minimize  injuries       Neuro Re-ed    Neuro Re-ed Details   cued for new bicep curl with mini squat, cued for lifting/ turning /  with wood logs                   PT Long Term Goals - 10/07/19 1046      PT LONG TERM GOAL #1   Title  Pt will demo decreased FOTO score of > 5 pts from PFDI Pelvic pain at 29 pts, PFDI Urinary 38 pts to indicate improved function and perform ADLs  ( 09/25/19:  PFDI 38 pts --> 4 pts, Pain 29 pts ---> 4 pts )    Time  10    Period  Weeks    Status  Achieved      PT LONG TERM GOAL #2   Title  Pt will demo increased FOTO urinary score from 54 to > 59 pts to indicate improved function and be able to decrease from 1 diaper to small urinary pantyliner per day .  ( 09/25/19: 68 pts)    Time  8    Period  Weeks    Status  Achieved      PT LONG TERM GOAL #3   Title  Pt will demo increased lumbar mobility in order to progress to multifidis strengthening to perform  lifting, pushing, pulling with less LBP    Time  4    Period  Weeks    Status  Achieved      PT LONG TERM GOAL #4   Title  Pt will demo more equal pelvic girdle height in standing with shoe lift , no tenderness at pubic symphysis L, and more levelled pubic symphysis across 2 visits in order to progress to deep core HEP and walk with less pain    Time  2    Period  Weeks    Status  Achieved      PT LONG TERM GOAL #5   Title  Pt walk > 1000 ft safely/ demo stability  without crutch and ascend/ stairs with safe mechanics / demo stability without crutch and report of < 2/10 pain, and increase gait speed at 0.36ms without crutch to > 1.2 m/s without crutch   in order to ambulate in his home/ community  and not worsen spinal and pelvic deviations.    Time  4    Period  Weeks    Status  Achieved      PT LONG TERM GOAL #6   Title  Pt will demo IND and proper technique with deep core strengthening and coordination in order to increase intraabdominal pressure for postural stability and bowel movement elimination    Time  8    Period  Weeks    Status  Achieved      PT LONG TERM GOAL #7   Title  Pt will report not needing to get up in at night  to urinate for 3+ x week for 2 weeks in order to improved sleep quality    Time  8    Period  Weeks    Status  On-going      PT LONG TERM GOAL  #10   TITLE  Pt will report no abdominal / pelvic pain with rolling B in bed in order to return to ADLs    Time  4    Period  Weeks    Status  Not Met      PT LONG TERM GOAL  #11   TITLE  Pt will demo proper technique . alternatives with the fitness exercises he used to do prior to abdominal pain and surgeries in  order to minimize relapse of Sx and icnreased load on pelvic floor mm    Time  10    Period  Weeks    Status  Achieved            Plan - 10/07/19 1507    Clinical Impression Statement Pt has achieved 9 /11 goals across the past 11 visits.  Pt's FOTO score for pain decreased from 29 pts  to 4 pts,  Urinary Sx from 38 pts  to 4pts ( lower pts indicate improved function) .    Pt's ability to pick up his leg to walk without abdominal pain improved with assessment of leg length difference (2/2 TKA) and addressed with a shoe lift on the first day.   Pt's abdominal pain now exists only with one activity at a low level with rolling over on the floor to perform HEP.   Pt's urinary leakage is resolved as pt no longer relies on wearing Depends for the past 3 weeks.   Pt's abdominal separation was addressed with deep core strengthening exercises and education about ways to not strain abdominopelvic area with sit ups / crunches and to implement logrolling with getting in/out of bed. Pt's scoliosis was addressed with specific exercises. Pt' was corrected on kegel exercises and he improved with proper technique to activate pelvic floor muscles with deep core mm without overuse of abdominal mm / dyscoordination.    Pt progressed well to resistance band strengthening for rotator cuff, thoracic extension, hips, overall conditioning to help pt maintain strength to perform home renovations with his house and [employer].  Educated pt on how to select functional exercises instead of his previously selected exercises with dumbbells or sit up/ crunches to minimize relapse of abdominal /pelvic/ groin pain and minimize incontinence. Pt required minor cues for proper technique and alignment in new HEP.  Pt is ready for d/c at this time.     Personal Factors and Comorbidities  Comorbidity 3+    Comorbidities  see multiple surgeries in medical Hx    Examination-Activity Limitations  Continence;Transfers;Lift;Stand;Locomotion Level    Stability/Clinical Decision Making  Evolving/Moderate complexity    Rehab Potential  Good    PT Frequency  1x / week    PT Duration  Other (comment)   10   PT Treatment/Interventions  Moist Heat;Stair training;Gait training;Functional mobility training;Therapeutic  activities;Therapeutic exercise;Patient/family education;Neuromuscular re-education;Balance training;Scar mobilization;Manual techniques;Taping;Energy conservation;Spinal Manipulations;Joint Manipulations    Consulted and Agree with Plan of Care  Patient       Patient will benefit from skilled therapeutic intervention in order to improve the following deficits and impairments:  Decreased scar mobility, Decreased strength, Decreased endurance, Decreased range of motion, Decreased safety awareness, Difficulty walking, Hypomobility, Hypermobility, Impaired sensation, Improper body mechanics, Pain, Increased muscle spasms, Decreased mobility, Decreased coordination, Decreased balance, Postural dysfunction  Visit Diagnosis: Sacrococcygeal disorders, not elsewhere classified  Difficulty in walking, not elsewhere classified  Other abnormalities of gait and mobility  Other lack of coordination  Muscle weakness (generalized)  Left knee pain, unspecified chronicity  Stiffness of left knee, not elsewhere classified     Problem List Patient Active Problem List   Diagnosis Date Noted  . Abdominal wall pain 07/08/2019  . Pelvic pain in male 07/08/2019  . Recurrent lower abdominal pain 07/08/2019  . Degenerative disc disease, lumbar 07/08/2019  . Colonoscopy refused- at this time  07/08/2019  . Bilateral knee pain 06/24/2019  . Non-recurrent unilateral inguinal hernia without obstruction or gangrene 08/29/2018  .  Osteoarthritis of left knee 05/23/2016  . Hearing loss 01/06/2015  . Chronic back pain 12/09/2014  . GERD (gastroesophageal reflux disease) 12/09/2014  . Obesity 12/09/2014  . PSA Elevation 12/09/2014  . Rosacea 12/09/2014  . Family history of malignant neoplasm of gastrointestinal tract 07/09/2008  . Lumbago 06/25/2007  . Low back pain 06/25/2007  . Diverticulosis of colon without hemorrhage 09/08/2006    Jerl Mina ,PT, DPT, E-RYT  10/07/2019, 3:38 PM  [place] MAIN [facility] SERVICES [address] [place], [place], [address] Phone: [phone]   Fax:  [phone]  Name: VYOM BRASS MRN: [id number] Date of Birth: Jun 05, 1946

## 2019-10-07 NOTE — Patient Instructions (Addendum)
STRENGTH exercises with bands to be done every other day with rest in between Can do the floor exercises daily    __________  Blue band    Under feet,  mini squat, inhale, knees behind toes   exhale, rise then bicep curls   ___________   Blue band under L foot,  L hand on wall Hold band in R hand, elbow by side Kick back with knee straight with R foot  - 20-30 deg back without low back involved   20 reps   ___   Minimizing strain with repeated  activities: lifting. Bending   _consider using wheel barrow to put logs in from the ground before loading into truck as an intermediary stop point to decrease the height change when lifting   _ make sure to move legs before twisting trunk   _ lifting log with minisquat then move feet into lunge and push off in the back foot to push log into truck   _load up logs into tractor trailer because it is lower and then drive it to the shed    _ [place] and allow for more time in between for rest and decreasing risk for repeetitive injuries   ___  Bring a cushio for lawn chair in the seat and back for more comfortable and less slouching

## 2019-10-09 ENCOUNTER — Encounter: Payer: PPO | Admitting: Physical Therapy

## 2019-10-22 NOTE — Progress Notes (Signed)
Subjective:   Alexander Moss is a 74 y.o. male who presents for Medicare Annual/Subsequent preventive examination.    This visit is being conducted through telemedicine due to the COVID-19 pandemic. This patient has given me verbal consent via doximity to conduct this visit, patient states they are participating from their home address. Some vital signs may be absent or patient reported.    Patient identification: identified by name, DOB, and current address  Review of Systems:  N/A  Cardiac Risk Factors include: advanced age (>64men, >51 women);male gender     Objective:    Vitals: There were no vitals taken for this visit.  There is no height or weight on file to calculate BMI. Unable to obtain vitals due to visit being conducted via telephonically.   Advanced Directives 10/23/2019 02/26/2019 01/07/2019 01/03/2019 09/07/2018 08/24/2018 12/27/2017  Does Patient Have a Medical Advance Directive? Yes Yes Yes Yes Yes Yes Yes  Type of Paramedic of [place];Living will - [place];Living will - [place];Living will [place];Living will [place];Living will  Does patient want to make changes to medical advance directive? - - No - Patient declined - No - Patient declined No - Patient declined -  Copy of [place] in Chart? No - copy requested - No - copy requested - No - copy requested No - copy requested No - copy requested    Tobacco Social History   Tobacco Use  Smoking Status Former Smoker  . Packs/day: 2.00  . Years: 15.00  . Pack years: 30.00  . Quit date: 05/30/1980  . Years since quitting: 39.4  Smokeless Tobacco Never Used  Tobacco Comment   remote smoking history, quit in 1981     Counseling given: Not Answered Comment: remote smoking history, quit in 1981   Clinical Intake:  Pre-visit preparation completed: Yes  Pain : 0-10 Pain Moss: 3   Pain Type: Chronic pain Pain Location: Abdomen Pain Orientation: Lower Pain Descriptors / Indicators: Aching Pain Frequency: Constant     Nutritional Risks: None Diabetes: No  How often do you need to have someone help you when you read instructions, pamphlets, or other written materials from your doctor or pharmacy?: 1 - Never  Interpreter Needed?: No  Information entered by :: [facility], LPN  Past Medical History:  Diagnosis Date  . Arthritis   . GERD (gastroesophageal reflux disease)   . Hyperlipidemia   . Left inguinal hernia 08/2018   Past Surgical History:  Procedure Laterality Date  . GREEN LIGHT LASER TURP (TRANSURETHRAL RESECTION OF PROSTATE N/A 03/05/2019   Procedure: GREEN LIGHT LASER TURP (TRANSURETHRAL RESECTION OF PROSTATE;  Surgeon: Royston Cowper, MD;  Location: [facility] ORS;  Service: Urology;  Laterality: N/A;  . [place]  . INGUINAL HERNIA REPAIR Left 09/07/2018   Procedure: OPEN LEFT HERNIA REPAIR - INGUINAL WITH MESH;  Surgeon: Fredirick Maudlin, MD;  Location: [facility] ORS;  Service: General;  Laterality: Left;  . JOINT REPLACEMENT Right 2017   Knee  . KNEE SURGERY Bilateral 2003  . TONSILLECTOMY AND ADENOIDECTOMY    . TOTAL KNEE ARTHROPLASTY Right 05/23/2016   Procedure: RIGHT TOTAL KNEE ARTHROPLASTY;  Surgeon: Gaynelle Arabian, MD;  Location: [facility];  Service: Orthopedics;  Laterality: Right;  . TOTAL KNEE ARTHROPLASTY Left 01/07/2019   Procedure: TOTAL KNEE ARTHROPLASTY;  Surgeon: Gaynelle Arabian, MD;  Location: [facility];  Service: Orthopedics;  Laterality: Left;  81min  Family History  Problem Relation Age of Onset  . Diabetes Mother   . Lung cancer Father   . Liver cancer Sister    Social History   Socioeconomic History  . Marital status: Married    Spouse name: Not on file  . Number of children: 1  . Years of education: [name]  . Highest education level: Bachelor's degree (e.g., BA, AB, BS)  Occupational History  . Occupation:  Retired  Tobacco Use  . Smoking status: Former Smoker    Packs/day: 2.00    Years: 15.00    Pack years: 30.00    Quit date: 05/30/1980    Years since quitting: 39.4  . Smokeless tobacco: Never Used  . Tobacco comment: remote smoking history, quit in 1981  Substance and Sexual Activity  . Alcohol use: Yes    Alcohol/week: 1.0 - 3.0 standard drinks    Types: 1 - 3 Standard drinks or equivalent per week    Comment: occ beer or wine  . Drug use: No  . Sexual activity: Not on file  Other Topics Concern  . Not on file  Social History Narrative  . Not on file   Social Determinants of Health   Financial Resource Strain: Low Risk   . Difficulty of Paying Living Expenses: Not hard at all  Food Insecurity: No Food Insecurity  . Worried About Charity fundraiser in the Last Year: Never true  . Ran Out of Food in the Last Year: Never true  Transportation Needs: No Transportation Needs  . Lack of Transportation (Medical): No  . Lack of Transportation (Non-Medical): No  Physical Activity: Inactive  . Days of Exercise per Week: 0 days  . Minutes of Exercise per Session: 0 min  Stress: Stress Concern Present  . Feeling of Stress : To some extent  Social Connections: Slightly Isolated  . Frequency of Communication with Friends and Family: More than three times a week  . Frequency of Social Gatherings with Friends and Family: Once a week  . Attends Religious Services: More than 4 times per year  . Active Member of Clubs or Organizations: No  . Attends Archivist Meetings: Never  . Marital Status: Married    Outpatient Encounter Medications as of 10/23/2019  Medication Sig  . calcium carbonate (TUMS EX) 750 MG chewable tablet Chew 750-1,500 mg by mouth daily as needed for heartburn.  . Calcium Carbonate-Vitamin D (CALCIUM PLUS VITAMIN D PO) Take by mouth daily.  . fexofenadine (ALLEGRA) 180 MG tablet Take 180 mg by mouth every morning.   . Omega-3 Fatty Acids (FISH OIL ULTRA)  1400 MG CAPS Take 1,400 mg by mouth daily.  . vitamin B-12 (CYANOCOBALAMIN) 1000 MCG tablet Take 1,000 mcg by mouth daily.   . Vitamin E 400 units TABS Take 400 Units by mouth daily.   . Zinc 25 MG TABS Take 25 mg by mouth daily.   Marland Kitchen docusate sodium (COLACE) 100 MG capsule Take 2 capsules (200 mg total) by mouth 2 (two) times daily. (Patient not taking: Reported on 10/23/2019)  . dutasteride (AVODART) 0.5 MG capsule Take 0.5 mg by mouth every morning.   Marland Kitchen FLUZONE HIGH-DOSE QUADRIVALENT 0.7 ML SUSY   . gabapentin (NEURONTIN) 300 MG capsule Take 1 capsule (300 mg total) by mouth 2 (two) times daily as needed. (Patient not taking: Reported on 10/23/2019)  . meloxicam (MOBIC) 15 MG tablet Take 1 tablet (15 mg total) by mouth daily. (Patient not taking: Reported on  06/24/2019)   No facility-administered encounter medications on file as of 10/23/2019.    Activities of Daily Living In your present state of health, do you have any difficulty performing the following activities: 10/23/2019 02/26/2019  Hearing? N N  Comment Wears hearing aids. -  Vision? N N  Difficulty concentrating or making decisions? N N  Walking or climbing stairs? N N  Dressing or bathing? N N  Doing errands, shopping? N N  Preparing Food and eating ? N -  Using the Toilet? N -  In the past six months, have you accidently leaked urine? N -  Do you have problems with loss of bowel control? N -  Managing your Medications? N -  Managing your Finances? N -  Housekeeping or managing your Housekeeping? N -  Some recent data might be hidden    Patient Care Team: Birdie Sons, MD as PCP - General (Family Medicine) Pinnix-Bailey, Damaris Schooner, MD as Consulting Physician (Dentistry) Parke Simmers, [place], [place] (Optometry) Jerl Mina, PT as Physical Therapist (Physical Therapy) Royston Cowper, MD as Consulting Physician (Urology)   Assessment:   This is a routine wellness examination for [place].  Exercise Activities and Dietary  recommendations Current Exercise Habits: The patient does not participate in regular exercise at present, Exercise limited by: Other - see comments(Due to abdominal muscle issues.)  Goals    . DIET - REDUCE SUGAR INTAKE     Recommend to continue cutting back on sugars in daily diet to help aid in weight loss.     . Increase water intake     Starting 05/12/16, I will increase my water in take back to 6 glasses a day.       Fall Risk: Fall Risk  10/23/2019 04/30/2019 09/19/2018 08/29/2018 05/30/2018  Falls in the past year? 0 0 0 0 1  Number falls in past yr: 0 0 0 0 1  Injury with Fall? 0 0 - - -  Risk for fall due to : - - - - -  Risk for fall due to: Comment - - - - -  Follow up - - Falls evaluation completed Falls evaluation completed -    FALL RISK PREVENTION PERTAINING TO THE HOME:  Any stairs in or around the home? Yes  If so, are there any without handrails? No   Home free of loose throw rugs in walkways, pet beds, electrical cords, etc? Yes  Adequate lighting in your home to reduce risk of falls? Yes   ASSISTIVE DEVICES UTILIZED TO PREVENT FALLS:  Life alert? No  Use of a cane, walker or w/c? No  Grab bars in the bathroom? Yes  Shower chair or bench in shower? Yes  Elevated toilet seat or a handicapped toilet? No   TIMED UP AND GO:  Was the test performed? No .    Depression Screen PHQ 2/9 Scores 08/09/2019 12/27/2017 01/27/2017 05/12/2016  PHQ - 2 Moss 0 0 0 0  PHQ- 9 Moss - - 1 -    Cognitive Function     6CIT Screen 10/23/2019 12/27/2017 05/12/2016  What Year? 0 points 0 points 0 points  What month? 0 points 0 points 0 points  What time? 0 points 0 points 0 points  Count back from 20 0 points 0 points 0 points  Months in reverse 0 points 0 points 0 points  Repeat phrase 0 points 0 points 0 points  Total Moss 0 0 0    Immunization History  Administered Date(s)  Administered  . Influenza, High Dose Seasonal PF 03/26/2019  . Influenza,inj,Quad PF,6+ Mos  04/10/2013, 04/26/2018  . Influenza,inj,quad, With Preservative 03/26/2019  . Influenza-Unspecified 05/08/2017  . Pneumococcal Conjugate-13 12/26/2014  . Pneumococcal Polysaccharide-23 07/29/2009  . Td 07/11/2006  . Zoster 03/05/2009    Qualifies for Shingles Vaccine? Yes  Zostavax completed 03/05/09. Due for Shingrix. Pt has been advised to call insurance company to determine out of pocket expense. Advised may also receive vaccine at local pharmacy or Health Dept. Verbalized acceptance and understanding.  Tdap: Although this vaccine is not a covered service during a Wellness Exam, does the patient still wish to receive this vaccine today?  No . Advised may receive this vaccine at local pharmacy or Health Dept. Aware to provide a copy of the vaccination record if obtained from local pharmacy or Health Dept. Verbalized acceptance and understanding.  Flu Vaccine: Up to date  Pneumococcal Vaccine: Completed series  Screening Tests Health Maintenance  Topic Date Due  . COLONOSCOPY  02/11/2019  . TETANUS/TDAP  10/22/2020 (Originally 07/11/2016)  . INFLUENZA VACCINE  02/09/2020  . Hepatitis C Screening  Completed  . PNA vac Low Risk Adult  Completed   Cancer Screenings:  Colorectal Screening: Completed 02/10/14. Repeat every 5 years. Declined GI referral today.   Lung Cancer Screening: (Low Dose CT Chest recommended if Age 24-80 years, 30 pack-year currently smoking OR have quit w/in 15years.) does not qualify.   Additional Screening:  Vision Screening: Recommended annual ophthalmology exams for early detection of glaucoma and other disorders of the eye.  Dental Screening: Recommended annual dental exams for proper oral hygiene  Community Resource Referral:  CRR required this visit?  No        Plan:  I have personally reviewed and addressed the Medicare Annual Wellness questionnaire and have noted the following in the patient's chart:  A. Medical and social history B. Use of  alcohol, tobacco or illicit drugs  C. Current medications and supplements D. Functional ability and status E.  Nutritional status F.  Physical activity G. Advance directives H. List of other physicians I.  Hospitalizations, surgeries, and ER visits in previous 12 months J.  [place] such as hearing and vision if needed, cognitive and depression L. Referrals and appointments   In addition, I have reviewed and discussed with patient certain preventive protocols, quality metrics, and best practice recommendations. A written personalized care plan for preventive services as well as general preventive health recommendations were provided to patient.   Alexander Moss, [place]  [phone] Nurse Health Advisor   Nurse Notes: Declined a colonoscopy referral today.

## 2019-10-23 ENCOUNTER — Ambulatory Visit (INDEPENDENT_AMBULATORY_CARE_PROVIDER_SITE_OTHER): Payer: PPO

## 2019-10-23 ENCOUNTER — Other Ambulatory Visit: Payer: Self-pay

## 2019-10-23 DIAGNOSIS — Z0000 Encounter for general adult medical examination without abnormal findings: Secondary | ICD-10-CM

## 2019-10-23 NOTE — Patient Instructions (Signed)
Alexander Moss , Thank you for taking time to come for your Medicare Wellness Visit. I appreciate your ongoing commitment to your health goals. Please review the following plan we discussed and let me know if I can assist you in the future.   Screening recommendations/referrals: Colonoscopy: Currently due. Declined referral today.  Recommended yearly ophthalmology/optometry visit for glaucoma screening and checkup Recommended yearly dental visit for hygiene and checkup  Vaccinations: Influenza vaccine: Up to date Pneumococcal vaccine: Completed series Tdap vaccine: Pt declines today.  Shingles vaccine: Pt declines today.     Advanced directives: Please bring a copy of your POA (Power of Attorney) and/or Living Will to your next appointment.   Conditions/risks identified: Recommend to cut back on desserts in diet and increase water intake to 6-8 8 oz glasses a day.   Next appointment: 10/28/20 @ 11:00 AM for an AWV. Declined scheduling a follow up with PCP at this time.   Preventive Care 76 Years and Older, Male Preventive care refers to lifestyle choices and visits with your health care provider that can promote health and wellness. What does preventive care include?  A yearly physical exam. This is also called an annual well check.  Dental exams once or twice a year.  Routine eye exams. Ask your health care provider how often you should have your eyes checked.  Personal lifestyle choices, including:  Daily care of your teeth and gums.  Regular physical activity.  Eating a healthy diet.  Avoiding tobacco and drug use.  Limiting alcohol use.  Practicing safe sex.  Taking low doses of aspirin every day.  Taking vitamin and mineral supplements as recommended by your health care provider. What happens during an annual well check? The services and screenings done by your health care provider during your annual well check will depend on your age, overall health, lifestyle risk  factors, and family history of disease. Counseling  Your health care provider may ask you questions about your:  Alcohol use.  Tobacco use.  Drug use.  Emotional well-being.  Home and relationship well-being.  Sexual activity.  Eating habits.  History of falls.  Memory and ability to understand (cognition).  Work and work Statistician. Screening  You may have the following tests or measurements:  Height, weight, and BMI.  Blood pressure.  Lipid and cholesterol levels. These may be checked every 5 years, or more frequently if you are over 20 years old.  Skin check.  Lung cancer screening. You may have this screening every year starting at age 49 if you have a 30-pack-year history of smoking and currently smoke or have quit within the past 15 years.  Fecal occult blood test (FOBT) of the stool. You may have this test every year starting at age 39.  Flexible sigmoidoscopy or colonoscopy. You may have a sigmoidoscopy every 5 years or a colonoscopy every 10 years starting at age 63.  Prostate cancer screening. Recommendations will vary depending on your family history and other risks.  Hepatitis C blood test.  Hepatitis B blood test.  Sexually transmitted disease (STD) testing.  Diabetes screening. This is done by checking your blood sugar (glucose) after you have not eaten for a while (fasting). You may have this done every 1-3 years.  Abdominal aortic aneurysm (AAA) screening. You may need this if you are a current or former smoker.  Osteoporosis. You may be screened starting at age 60 if you are at high risk. Talk with your health care provider about your test  results, treatment options, and if necessary, the need for more tests. Vaccines  Your health care provider may recommend certain vaccines, such as:  Influenza vaccine. This is recommended every year.  Tetanus, diphtheria, and acellular pertussis (Tdap, Td) vaccine. You may need a Td booster every 10  years.  Zoster vaccine. You may need this after age 53.  Pneumococcal 13-valent conjugate (PCV13) vaccine. One dose is recommended after age 15.  Pneumococcal polysaccharide (PPSV23) vaccine. One dose is recommended after age 82. Talk to your health care provider about which screenings and vaccines you need and how often you need them. This information is not intended to replace advice given to you by your health care provider. Make sure you discuss any questions you have with your health care provider. Document Released: 07/24/2015 Document Revised: 03/16/2016 Document Reviewed: 04/28/2015 Elsevier Interactive Patient Education  2017 [place] Prevention in the Home Falls can cause injuries. They can happen to people of all ages. There are many things you can do to make your home safe and to help prevent falls. What can I do on the outside of my home?  Regularly fix the edges of walkways and driveways and fix any cracks.  Remove anything that might make you trip as you walk through a door, such as a raised step or threshold.  Trim any bushes or trees on the path to your home.  Use bright outdoor lighting.  Clear any walking paths of anything that might make someone trip, such as rocks or tools.  Regularly check to see if handrails are loose or broken. Make sure that both sides of any steps have handrails.  Any raised decks and porches should have guardrails on the edges.  Have any leaves, snow, or ice cleared regularly.  Use sand or salt on walking paths during winter.  Clean up any spills in your garage right away. This includes oil or grease spills. What can I do in the bathroom?  Use night lights.  Install grab bars by the toilet and in the tub and shower. Do not use towel bars as grab bars.  Use non-skid mats or decals in the tub or shower.  If you need to sit down in the shower, use a plastic, non-slip stool.  Keep the floor dry. Clean up any water that  spills on the floor as soon as it happens.  Remove soap buildup in the tub or shower regularly.  Attach bath mats securely with double-sided non-slip rug tape.  Do not have throw rugs and other things on the floor that can make you trip. What can I do in the bedroom?  Use night lights.  Make sure that you have a light by your bed that is easy to reach.  Do not use any sheets or blankets that are too big for your bed. They should not hang down onto the floor.  Have a firm chair that has side arms. You can use this for support while you get dressed.  Do not have throw rugs and other things on the floor that can make you trip. What can I do in the kitchen?  Clean up any spills right away.  Avoid walking on wet floors.  Keep items that you use a lot in easy-to-reach places.  If you need to reach something above you, use a strong step stool that has a grab bar.  Keep electrical cords out of the way.  Do not use floor polish or wax that makes  floors slippery. If you must use wax, use non-skid floor wax.  Do not have throw rugs and other things on the floor that can make you trip. What can I do with my stairs?  Do not leave any items on the stairs.  Make sure that there are handrails on both sides of the stairs and use them. Fix handrails that are broken or loose. Make sure that handrails are as long as the stairways.  Check any carpeting to make sure that it is firmly attached to the stairs. Fix any carpet that is loose or worn.  Avoid having throw rugs at the top or bottom of the stairs. If you do have throw rugs, attach them to the floor with carpet tape.  Make sure that you have a light switch at the top of the stairs and the bottom of the stairs. If you do not have them, ask someone to add them for you. What else can I do to help prevent falls?  Wear shoes that:  Do not have high heels.  Have rubber bottoms.  Are comfortable and fit you well.  Are closed at the  toe. Do not wear sandals.  If you use a stepladder:  Make sure that it is fully opened. Do not climb a closed stepladder.  Make sure that both sides of the stepladder are locked into place.  Ask someone to hold it for you, if possible.  Clearly mark and make sure that you can see:  Any grab bars or handrails.  First and last steps.  Where the edge of each step is.  Use tools that help you move around (mobility aids) if they are needed. These include:  Canes.  Walkers.  Scooters.  Crutches.  Turn on the lights when you go into a dark area. Replace any light bulbs as soon as they burn out.  Set up your furniture so you have a clear path. Avoid moving your furniture around.  If any of your floors are uneven, fix them.  If there are any pets around you, be aware of where they are.  Review your medicines with your doctor. Some medicines can make you feel dizzy. This can increase your chance of falling. Ask your doctor what other things that you can do to help prevent falls. This information is not intended to replace advice given to you by your health care provider. Make sure you discuss any questions you have with your health care provider. Document Released: 04/23/2009 Document Revised: 12/03/2015 Document Reviewed: 08/01/2014 Elsevier Interactive Patient Education  2017 [employer].

## 2019-10-31 ENCOUNTER — Other Ambulatory Visit: Payer: Self-pay | Admitting: Urology

## 2019-10-31 DIAGNOSIS — R310 Gross hematuria: Secondary | ICD-10-CM

## 2019-11-13 ENCOUNTER — Ambulatory Visit
Admission: RE | Admit: 2019-11-13 | Discharge: 2019-11-13 | Disposition: A | Discharge status: Home / Self Care | Payer: PPO | Source: Ambulatory Visit | Attending: Urology | Admitting: Urology

## 2019-11-13 ENCOUNTER — Other Ambulatory Visit: Payer: Self-pay

## 2019-11-13 DIAGNOSIS — R310 Gross hematuria: Secondary | ICD-10-CM | POA: Insufficient documentation

## 2019-11-13 LAB — POCT I-STAT CREATININE: Creatinine, Ser: 1.1 mg/dL (ref 0.61–1.24)

## 2019-11-13 MED ORDER — IOHEXOL 300 MG/ML  SOLN
125.0000 mL | Freq: Once | INTRAMUSCULAR | Status: AC | PRN
  Administered 2019-11-13: 125 mL via INTRAVENOUS

## 2019-11-15 DIAGNOSIS — R310 Gross hematuria: Secondary | ICD-10-CM | POA: Diagnosis not present

## 2019-11-15 DIAGNOSIS — N401 Enlarged prostate with lower urinary tract symptoms: Secondary | ICD-10-CM | POA: Diagnosis not present

## 2019-12-16 DIAGNOSIS — R809 Proteinuria, unspecified: Secondary | ICD-10-CM | POA: Diagnosis not present

## 2019-12-16 DIAGNOSIS — R310 Gross hematuria: Secondary | ICD-10-CM | POA: Diagnosis not present

## 2020-01-16 DIAGNOSIS — R809 Proteinuria, unspecified: Secondary | ICD-10-CM | POA: Diagnosis not present

## 2020-01-16 DIAGNOSIS — R310 Gross hematuria: Secondary | ICD-10-CM | POA: Diagnosis not present

## 2020-03-12 DIAGNOSIS — R310 Gross hematuria: Secondary | ICD-10-CM | POA: Diagnosis not present

## 2020-03-12 DIAGNOSIS — R809 Proteinuria, unspecified: Secondary | ICD-10-CM | POA: Diagnosis not present

## 2020-04-13 ENCOUNTER — Other Ambulatory Visit: Payer: Self-pay

## 2020-04-13 ENCOUNTER — Encounter
Admission: RE | Admit: 2020-04-13 | Discharge: 2020-04-13 | Disposition: A | Discharge status: Home / Self Care | Payer: PPO | Source: Ambulatory Visit | Attending: Nephrology | Admitting: Nephrology

## 2020-04-13 ENCOUNTER — Other Ambulatory Visit: Payer: PPO

## 2020-04-13 DIAGNOSIS — Z01812 Encounter for preprocedural laboratory examination: Secondary | ICD-10-CM | POA: Insufficient documentation

## 2020-04-13 LAB — TYPE AND SCREEN
ABO/RH(D): O POS
Antibody Screen: NEGATIVE

## 2020-04-13 LAB — URINALYSIS, ROUTINE W REFLEX MICROSCOPIC
Bacteria, UA: NONE SEEN
Bilirubin Urine: NEGATIVE
Glucose, UA: NEGATIVE mg/dL
Ketones, ur: NEGATIVE mg/dL
Leukocytes,Ua: NEGATIVE
Nitrite: NEGATIVE
Protein, ur: 30 mg/dL — AB
Specific Gravity, Urine: 1.019 (ref 1.005–1.030)
Squamous Epithelial / HPF: NONE SEEN (ref 0–5)
pH: 5 (ref 5.0–8.0)

## 2020-04-13 LAB — COMPREHENSIVE METABOLIC PANEL
ALT: 19 U/L (ref 0–44)
AST: 20 U/L (ref 15–41)
Albumin: 3.7 g/dL (ref 3.5–5.0)
Alkaline Phosphatase: 63 U/L (ref 38–126)
Anion gap: 8 (ref 5–15)
BUN: 17 mg/dL (ref 8–23)
CO2: 24 mmol/L (ref 22–32)
Calcium: 8.9 mg/dL (ref 8.9–10.3)
Chloride: 103 mmol/L (ref 98–111)
Creatinine, Ser: 1.04 mg/dL (ref 0.61–1.24)
GFR calc Af Amer: >60 mL/min (ref >60)
GFR calc non Af Amer: >60 mL/min (ref >60)
Glucose, Bld: 95 mg/dL (ref 70–99)
Potassium: 3.8 mmol/L (ref 3.5–5.1)
Sodium: 135 mmol/L (ref 135–145)
Total Bilirubin: 0.8 mg/dL (ref 0.3–1.2)
Total Protein: 7.3 g/dL (ref 6.5–8.1)

## 2020-04-13 LAB — CBC
HCT: 43.2 % (ref 39.0–52.0)
Hemoglobin: 14.8 g/dL (ref 13.0–17.0)
MCH: 33.1 pg (ref 26.0–34.0)
MCHC: 34.3 g/dL (ref 30.0–36.0)
MCV: 96.6 fL (ref 80.0–100.0)
Platelets: 250 10*3/uL (ref 150–400)
RBC: 4.47 MIL/uL (ref 4.22–5.81)
RDW: 12.6 % (ref 11.5–15.5)
WBC: 7.1 10*3/uL (ref 4.0–10.5)
nRBC: 0 % (ref 0.0–0.2)

## 2020-04-13 LAB — DIFFERENTIAL
Abs Immature Granulocytes: 0.01 10*3/uL (ref 0.00–0.07)
Basophils Absolute: 0.1 10*3/uL (ref 0.0–0.1)
Basophils Relative: 1 %
Eosinophils Absolute: 0.6 10*3/uL — ABNORMAL HIGH (ref 0.0–0.5)
Eosinophils Relative: 8 %
Immature Granulocytes: 0 %
Lymphocytes Relative: 23 %
Lymphs Abs: 1.6 10*3/uL (ref 0.7–4.0)
Monocytes Absolute: 0.7 10*3/uL (ref 0.1–1.0)
Monocytes Relative: 10 %
Neutro Abs: 4.2 10*3/uL (ref 1.7–7.7)
Neutrophils Relative %: 58 %

## 2020-04-13 LAB — PROTIME-INR
INR: 1 (ref 0.8–1.2)
Prothrombin Time: 12.7 seconds (ref 11.4–15.2)

## 2020-04-13 LAB — PROTEIN / CREATININE RATIO, URINE
Creatinine, Urine: 156 mg/dL
Protein Creatinine Ratio: 0.21 mg/mg{Cre} — ABNORMAL HIGH (ref 0.00–0.15)
Total Protein, Urine: 32 mg/dL

## 2020-04-15 ENCOUNTER — Observation Stay: Payer: PPO

## 2020-04-15 ENCOUNTER — Observation Stay
Admission: RE | Admit: 2020-04-15 | Discharge: 2020-04-16 | Disposition: A | Discharge status: Home / Self Care | Payer: PPO | Source: Ambulatory Visit | Attending: Nephrology | Admitting: Nephrology

## 2020-04-15 ENCOUNTER — Other Ambulatory Visit: Payer: Self-pay

## 2020-04-15 DIAGNOSIS — Z20822 Contact with and (suspected) exposure to covid-19: Secondary | ICD-10-CM | POA: Diagnosis not present

## 2020-04-15 DIAGNOSIS — R3129 Other microscopic hematuria: Secondary | ICD-10-CM | POA: Diagnosis not present

## 2020-04-15 DIAGNOSIS — R319 Hematuria, unspecified: Secondary | ICD-10-CM | POA: Diagnosis not present

## 2020-04-15 DIAGNOSIS — R809 Proteinuria, unspecified: Secondary | ICD-10-CM | POA: Diagnosis not present

## 2020-04-15 LAB — RESPIRATORY PANEL BY RT PCR (FLU A&B, COVID)
Influenza A by PCR: NEGATIVE
Influenza B by PCR: NEGATIVE
SARS Coronavirus 2 by RT PCR: NEGATIVE

## 2020-04-15 LAB — CBC
HCT: 39.5 % (ref 39.0–52.0)
Hemoglobin: 14.4 g/dL (ref 13.0–17.0)
MCH: 34 pg (ref 26.0–34.0)
MCHC: 36.5 g/dL — ABNORMAL HIGH (ref 30.0–36.0)
MCV: 93.4 fL (ref 80.0–100.0)
Platelets: 246 10*3/uL (ref 150–400)
RBC: 4.23 MIL/uL (ref 4.22–5.81)
RDW: 12.7 % (ref 11.5–15.5)
WBC: 7.5 10*3/uL (ref 4.0–10.5)
nRBC: 0 % (ref 0.0–0.2)

## 2020-04-15 MED ORDER — SODIUM CHLORIDE 0.9 % IV SOLN: INTRAVENOUS | Status: DC

## 2020-04-15 NOTE — Procedures (Signed)
After obtaining informed consent, the patient was brought down to the ultrasound suite. Subsequently the left kidney was identified under ultrasound. The left flank was prepped and draped in standard sterile fashion. Local anesthesia was achieved using 1% lidocaine. Subsequently using an 18-gauge biopsy device and ultraound guidance, a total of 4 passes were made into the left kidney. The specimens were submitted to pathology and deemed to be adequate for submission. No active bleeding noted on post-biopsy images.   The patient tolerated the procedure very well. He will return to his room for continued observation.   Estimated blood loss: None.  Complications: No immediate complications.

## 2020-04-16 ENCOUNTER — Encounter: Payer: Self-pay | Admitting: Nephrology

## 2020-04-16 DIAGNOSIS — R3129 Other microscopic hematuria: Secondary | ICD-10-CM

## 2020-04-16 LAB — CBC
HCT: 41.2 % (ref 39.0–52.0)
Hemoglobin: 14.4 g/dL (ref 13.0–17.0)
MCH: 33.9 pg (ref 26.0–34.0)
MCHC: 35 g/dL (ref 30.0–36.0)
MCV: 96.9 fL (ref 80.0–100.0)
Platelets: 232 10*3/uL (ref 150–400)
RBC: 4.25 MIL/uL (ref 4.22–5.81)
RDW: 12.8 % (ref 11.5–15.5)
WBC: 7.9 10*3/uL (ref 4.0–10.5)
nRBC: 0 % (ref 0.0–0.2)

## 2020-04-16 NOTE — Care Management Obs Status (Signed)
[facility] NOTIFICATION   Patient Details  Name: Alexander Moss MRN: [id number] Date of Birth: 23-Jan-1946   Medicare Observation Status Notification Given:  Yes    Beverly Sessions, RN 04/16/2020, 9:29 AM

## 2020-04-16 NOTE — Discharge Summary (Signed)
Physician Discharge Summary  Patient ID: Alexander Moss MRN: [id number] DOB/AGE: 76/01/1946 76 y.o.  Admit date: 04/15/2020 Discharge date: 04/16/2020  Admission Diagnoses:  Discharge Diagnoses:  Active Problems:   Microscopic hematuria   Discharged Condition: Good  Hospital Course: Patient was admitted under observation to the hospital on 04/15/2020 for percutaneous ultrasound-guided left renal biopsy.  This was performed successfully with no complications.  Patient tolerated procedure quite well.  Post procedure hemoglobin was stable.  Patient not complaining of any pain this AM.  We will hopefully have a preliminary biopsy result either tomorrow or Monday.  Suspect IgA nephropathy but will await final biopsy report.  We have advised the patient no heavy lifting greater than 5 pounds for 2 weeks.  Consults: None  Significant Diagnostic Studies: Percutaneous ultrasound-guided left renal biopsy  Treatments: None.  Discharge Exam: Blood pressure 112/77, pulse (!) 57, temperature 98 F (36.7 C), temperature source Oral, resp. rate 18, height 5' 7.5" (1.715 m), weight 100.2 kg, SpO2 96 %. General appearance: WDWN NAD HEENT: Atraumatic; EOMI, hearing intact, OM moist Neck: Trachea midline; supple Lungs: CTAB, with normal respiratory effort  CV: S1S2, no obvious rub Abdomen: Soft, non-tender; BS present Extremities: No peripheral edema Skin: Warm and dry, no rashes noted. Neuro/Psych: Awake, alert, following commands, normal mood    Disposition:  Discharge to home      Signed: Areatha Kalata 04/16/2020, 8:38 AM

## 2020-04-16 NOTE — Progress Notes (Signed)
Alexander Moss and O x4. VSS. Pt tolerating diet well. No complaints of nausea or vomiting. IV removed intact, prescriptions given. Pt voices understanding of discharge instructions with no further questions. Patient discharged via wheelchair with SN  Allergies as of 04/16/2020   No Known Allergies     Medication List    STOP taking these medications   meloxicam 15 MG tablet Commonly known as: MOBIC     TAKE these medications   calcium carbonate 750 MG chewable tablet Commonly known as: TUMS EX Chew 750-1,500 mg by mouth daily as needed for heartburn.   CALCIUM PLUS VITAMIN D PO Take by mouth daily.   docusate sodium 100 MG capsule Commonly known as: COLACE Take 2 capsules (200 mg total) by mouth 2 (two) times daily.   dutasteride 0.5 MG capsule Commonly known as: AVODART Take 0.5 mg by mouth every morning.   fexofenadine 180 MG tablet Commonly known as: ALLEGRA Take 180 mg by mouth every morning.   Fish Oil Ultra 1400 MG Caps Take 1,400 mg by mouth daily.   Fluzone High-Dose Quadrivalent 0.7 ML Susy Generic drug: Influenza Vac High-Dose Quad   gabapentin 300 MG capsule Commonly known as: NEURONTIN Take 1 capsule (300 mg total) by mouth 2 (two) times daily as needed.   loratadine 10 MG tablet Commonly known as: CLARITIN Take 10 mg by mouth daily.   vitamin B-12 1000 MCG tablet Commonly known as: CYANOCOBALAMIN Take 1,000 mcg by mouth daily.   Vitamin E 400 units Tabs Take 400 Units by mouth daily.   Zinc 25 MG Tabs Take 25 mg by mouth daily.       Vitals:   04/16/20 0008 04/16/20 0511  BP: (!) 104/54 112/77  Pulse: 62 (!) 57  Resp: 18 18  Temp: 98.1 F (36.7 C) 98 F (36.7 C)  SpO2: 97% 96%    Alexander Moss

## 2020-04-23 ENCOUNTER — Encounter: Payer: Self-pay | Admitting: Nephrology

## 2020-04-23 LAB — SURGICAL PATHOLOGY

## 2020-05-15 DIAGNOSIS — N182 Chronic kidney disease, stage 2 (mild): Secondary | ICD-10-CM | POA: Diagnosis not present

## 2020-05-15 DIAGNOSIS — N028 Recurrent and persistent hematuria with other morphologic changes: Secondary | ICD-10-CM | POA: Diagnosis not present

## 2020-05-15 DIAGNOSIS — R809 Proteinuria, unspecified: Secondary | ICD-10-CM | POA: Diagnosis not present

## 2020-05-25 NOTE — H&P (Signed)
[facility] ADMIT NOTE    Date: 05/25/2020                  Patient Name:  Alexander Moss  MRN: [id number]  DOB: 19-Jan-1946  Age / Sex: 74 y.o., male         PCP: Birdie Sons, MD                                   Reason for Admission: Microscopic hematuria            History of Present Illness: Patient is a 74 y.o. male with a PMHx of GERD, osteoarthrosis, hyperlipidemia, obstructive sleep apnea, BPH status post photo vaporization of the prostate 02/13/2019 who was admitted to [facility] on 04/15/2020 for evaluation of microscopic hematuria.  Patient was having discrete periods of gross hematuria periodically.  This did eventually abate however patient still has microscopic hematuria.  He had extensive serologic work-up performed.  Giving on ongoing microscopic hematuria with dysmorphic RBCs in the urine we recommended renal biopsy.  Patient presented for the same today.   Medications: Outpatient medications: No medications prior to admission.    Current medications: No current facility-administered medications for this encounter.   Current Outpatient Medications  Medication Sig Dispense Refill  . Calcium Carbonate-Vitamin D (CALCIUM PLUS VITAMIN D PO) Take by mouth daily.    Marland Kitchen loratadine (CLARITIN) 10 MG tablet Take 10 mg by mouth daily.    . Omega-3 Fatty Acids (FISH OIL ULTRA) 1400 MG CAPS Take 1,400 mg by mouth daily.    . vitamin B-12 (CYANOCOBALAMIN) 1000 MCG tablet Take 1,000 mcg by mouth daily.     . Vitamin E 400 units TABS Take 400 Units by mouth daily.     . Zinc 25 MG TABS Take 25 mg by mouth daily.     . calcium carbonate (TUMS EX) 750 MG chewable tablet Chew 750-1,500 mg by mouth daily as needed for heartburn.    . docusate sodium (COLACE) 100 MG capsule Take 2 capsules (200 mg total) by mouth 2 (two) times daily. (Patient not taking: Reported on 10/23/2019) 120 capsule 3  . dutasteride (AVODART) 0.5 MG capsule Take 0.5 mg by mouth every  morning.  (Patient not taking: Reported on 04/15/2020)    . fexofenadine (ALLEGRA) 180 MG tablet Take 180 mg by mouth every morning.  (Patient not taking: Reported on 04/15/2020)    . FLUZONE HIGH-DOSE QUADRIVALENT 0.7 ML SUSY     . gabapentin (NEURONTIN) 300 MG capsule Take 1 capsule (300 mg total) by mouth 2 (two) times daily as needed. (Patient not taking: Reported on 10/23/2019) 60 capsule 0      Allergies: No Known Allergies    Past Medical History: Past Medical History:  Diagnosis Date  . Arthritis   . GERD (gastroesophageal reflux disease)   . Hyperlipidemia   . Left inguinal hernia 08/2018     Past Surgical History: Past Surgical History:  Procedure Laterality Date  . GREEN LIGHT LASER TURP (TRANSURETHRAL RESECTION OF PROSTATE N/A 03/05/2019   Procedure: GREEN LIGHT LASER TURP (TRANSURETHRAL RESECTION OF PROSTATE;  Surgeon: Royston Cowper, MD;  Location: [facility] ORS;  Service: Urology;  Laterality: N/A;  . [place]  . INGUINAL HERNIA REPAIR Left 09/07/2018   Procedure: OPEN LEFT HERNIA REPAIR - INGUINAL WITH MESH;  Surgeon: Fredirick Maudlin, MD;  Location: [facility] ORS;  Service: General;  Laterality: Left;  . JOINT REPLACEMENT Right 2017   Knee  . KNEE SURGERY Bilateral 2003  . TONSILLECTOMY AND ADENOIDECTOMY    . TOTAL KNEE ARTHROPLASTY Right 05/23/2016   Procedure: RIGHT TOTAL KNEE ARTHROPLASTY;  Surgeon: Gaynelle Arabian, MD;  Location: [facility] ORS;  Service: Orthopedics;  Laterality: Right;  . TOTAL KNEE ARTHROPLASTY Left 01/07/2019   Procedure: TOTAL KNEE ARTHROPLASTY;  Surgeon: Gaynelle Arabian, MD;  Location: [facility] ORS;  Service: Orthopedics;  Laterality: Left;  84min     Family History: Family History  Problem Relation Age of Onset  . Diabetes Mother   . Lung cancer Father   . Liver cancer Sister      Social History: Social History   Socioeconomic History  . Marital status: Married    Spouse name: Not on file  . Number of children: 1  . Years of  education: [name]  . Highest education level: Bachelor's degree (e.g., BA, AB, BS)  Occupational History  . Occupation: Retired  Tobacco Use  . Smoking status: Former Smoker    Packs/day: 2.00    Years: 15.00    Pack years: 30.00    Quit date: 05/30/1980    Years since quitting: 40.0  . Smokeless tobacco: Never Used  . Tobacco comment: remote smoking history, quit in 1981  Vaping Use  . Vaping Use: Never used  Substance and Sexual Activity  . Alcohol use: Yes    Alcohol/week: 1.0 - 3.0 standard drink    Types: 1 - 3 Standard drinks or equivalent per week    Comment: occ beer or wine  . Drug use: No  . Sexual activity: Not on file  Other Topics Concern  . Not on file  Social History Narrative  . Not on file   Social Determinants of Health   Financial Resource Strain: Low Risk   . Difficulty of Paying Living Expenses: Not hard at all  Food Insecurity: No Food Insecurity  . Worried About Charity fundraiser in the Last Year: Never true  . Ran Out of Food in the Last Year: Never true  Transportation Needs: No Transportation Needs  . Lack of Transportation (Medical): No  . Lack of Transportation (Non-Medical): No  Physical Activity: Inactive  . Days of Exercise per Week: 0 days  . Minutes of Exercise per Session: 0 min  Stress: Stress Concern Present  . Feeling of Stress : To some extent  Social Connections: Moderately Integrated  . Frequency of Communication with Friends and Family: More than three times a week  . Frequency of Social Gatherings with Friends and Family: Once a week  . Attends Religious Services: More than 4 times per year  . Active Member of Clubs or Organizations: No  . Attends Archivist Meetings: Never  . Marital Status: Married  Human resources officer Violence: Not At Risk  . Fear of Current or Ex-Partner: No  . Emotionally Abused: No  . Physically Abused: No  . Sexually Abused: No     Review of Systems: Review of Systems   Constitutional: Negative for chills, fever and malaise/fatigue.  HENT: Negative for congestion, hearing loss and tinnitus.   Eyes: Negative for blurred vision and double vision.  Respiratory: Negative for cough, sputum production and shortness of breath.   Cardiovascular: Negative for chest pain, palpitations and orthopnea.  Gastrointestinal: Negative for diarrhea, nausea and vomiting.  Genitourinary: Positive for hematuria. Negative for dysuria, frequency and urgency.  Musculoskeletal: Negative for myalgias.  Skin: Negative for itching and rash.  Neurological: Negative for dizziness and focal weakness.  Endo/Heme/Allergies: Negative for polydipsia. Does not bruise/bleed easily.  Psychiatric/Behavioral: The patient is not nervous/anxious.      Vital Signs: Blood pressure 112/77, pulse (!) 57, temperature 98 F (36.7 C), temperature source Oral, resp. rate 18, height 5' 7.5" (1.715 m), weight 100.2 kg, SpO2 96 %.  Weight trends: Filed Weights   04/16/20 0008  Weight: 100.2 kg    Physical Exam: Physical Exam: General:  No acute distress  Head:  Normocephalic, atraumatic. Moist oral mucosal membranes  Eyes:  Anicteric  Neck:  Supple  Lungs:   Clear to auscultation, normal effort  Heart:  S1S2 no rubs  Abdomen:   Soft, nontender, bowel sounds present  Extremities:  No peripheral edema.  Neurologic:  Awake, alert, following commands  Skin:  No lesions       Lab results: Basic Metabolic Panel: No results for input(s): NA, K, CL, CO2, GLUCOSE, BUN, CREATININE, CALCIUM, MG, PHOS in the last 168 hours.  Liver Function Tests: No results for input(s): AST, ALT, ALKPHOS, BILITOT, PROT, ALBUMIN in the last 168 hours. No results for input(s): LIPASE, AMYLASE in the last 168 hours. No results for input(s): AMMONIA in the last 168 hours.  CBC: No results for input(s): WBC, NEUTROABS, HGB, HCT, MCV, PLT in the last 168 hours.  Cardiac Enzymes: No results for input(s): CKTOTAL,  CKMB, CKMBINDEX, TROPONINI in the last 168 hours.  BNP: Invalid input(s): POCBNP  CBG: No results for input(s): GLUCAP in the last 168 hours.  Microbiology: Results for orders placed or performed during the hospital encounter of 04/15/20  Respiratory Panel by RT PCR (Flu A&B, Covid) - Nasopharyngeal Swab     Status: None   Collection Time: 04/15/20  8:13 AM   Specimen: Nasopharyngeal Swab  Result Value Ref Range Status   SARS Coronavirus 2 by RT PCR NEGATIVE NEGATIVE Final    Comment: (NOTE) SARS-CoV-2 target nucleic acids are NOT DETECTED.  The SARS-CoV-2 RNA is generally detectable in upper respiratoy specimens during the acute phase of infection. The lowest concentration of SARS-CoV-2 viral copies this assay can detect is 131 copies/mL. A negative result does not preclude SARS-Cov-2 infection and should not be used as the sole basis for treatment or other patient management decisions. A negative result may occur with  improper specimen collection/handling, submission of specimen other than nasopharyngeal swab, presence of viral mutation(s) within the areas targeted by this assay, and inadequate number of viral copies (<131 copies/mL). A negative result must be combined with clinical observations, patient history, and epidemiological information. The expected result is Negative.  Fact Sheet for Patients:  PinkCheek.be  Fact Sheet for Healthcare Providers:  GravelBags.it  This test is no t yet approved or cleared by the [place] FDA and  has been authorized for detection and/or diagnosis of SARS-CoV-2 by FDA under an Emergency Use Authorization (EUA). This EUA will remain  in effect (meaning this test can be used) for the duration of the COVID-19 declaration under Section 564(b)(1) of the Act, 21 U.S.C. section 360bbb-3(b)(1), unless the authorization is terminated or revoked sooner.     Influenza A by PCR  NEGATIVE NEGATIVE Final   Influenza B by PCR NEGATIVE NEGATIVE Final    Comment: (NOTE) The Xpert Xpress SARS-CoV-2/FLU/RSV assay is intended as an aid in  the diagnosis of influenza from Nasopharyngeal swab specimens and  should not be used as a sole basis for treatment. Nasal  washings and  aspirates are unacceptable for Xpert Xpress SARS-CoV-2/FLU/RSV  testing.  Fact Sheet for Patients: PinkCheek.be  Fact Sheet for Healthcare Providers: GravelBags.it  This test is not yet approved or cleared by the [place] FDA and  has been authorized for detection and/or diagnosis of SARS-CoV-2 by  FDA under an Emergency Use Authorization (EUA). This EUA will remain  in effect (meaning this test can be used) for the duration of the  Covid-19 declaration under Section 564(b)(1) of the Act, 21  U.S.C. section 360bbb-3(b)(1), unless the authorization is  terminated or revoked. Performed at [facility], [address] [place], [place] [address]     Coagulation Studies: No results for input(s): LABPROT, INR in the last 72 hours.  Urinalysis: No results for input(s): COLORURINE, LABSPEC, PHURINE, GLUCOSEU, HGBUR, BILIRUBINUR, KETONESUR, PROTEINUR, UROBILINOGEN, NITRITE, LEUKOCYTESUR in the last 72 hours.  Invalid input(s): APPERANCEUR    Imaging:  No results found.   Assessment & Plan: Pt is a 74 y.o. male with a PMHx of GERD, osteoarthrosis, hyperlipidemia, obstructive sleep apnea, BPH status post photo vaporization of the prostate 02/13/2019 who was admitted to [facility] on 04/15/2020 for evaluation of microscopic hematuria  1.  Microscopic hematuria with dysmorphic RBCs noted on microscopy.  Patient had extensive serologic work-up which was negative as an outpatient.  Given ongoing microscopic hematuria renal biopsy has been recommended.  Patient to undergo this today via percutaneous ultrasound-guided renal biopsy.   Specimen will be sent off to [facility] nephropathology.

## 2020-06-15 DIAGNOSIS — N3281 Overactive bladder: Secondary | ICD-10-CM | POA: Diagnosis not present

## 2020-06-15 DIAGNOSIS — N401 Enlarged prostate with lower urinary tract symptoms: Secondary | ICD-10-CM | POA: Diagnosis not present

## 2020-06-15 DIAGNOSIS — Z125 Encounter for screening for malignant neoplasm of prostate: Secondary | ICD-10-CM | POA: Diagnosis not present

## 2020-06-15 DIAGNOSIS — R351 Nocturia: Secondary | ICD-10-CM | POA: Diagnosis not present

## 2020-06-15 DIAGNOSIS — R350 Frequency of micturition: Secondary | ICD-10-CM | POA: Diagnosis not present

## 2020-08-11 DIAGNOSIS — R809 Proteinuria, unspecified: Secondary | ICD-10-CM | POA: Diagnosis not present

## 2020-08-11 DIAGNOSIS — N028 Recurrent and persistent hematuria with other morphologic changes: Secondary | ICD-10-CM | POA: Diagnosis not present

## 2020-08-11 DIAGNOSIS — N182 Chronic kidney disease, stage 2 (mild): Secondary | ICD-10-CM | POA: Diagnosis not present

## 2020-12-10 DIAGNOSIS — R809 Proteinuria, unspecified: Secondary | ICD-10-CM | POA: Diagnosis not present

## 2020-12-10 DIAGNOSIS — N028 Recurrent and persistent hematuria with other morphologic changes: Secondary | ICD-10-CM | POA: Diagnosis not present

## 2020-12-10 DIAGNOSIS — N182 Chronic kidney disease, stage 2 (mild): Secondary | ICD-10-CM | POA: Diagnosis not present

## 2021-01-13 ENCOUNTER — Telehealth: Payer: Self-pay | Admitting: Family Medicine

## 2021-01-13 NOTE — Telephone Encounter (Signed)
Seth Bake calling from [facility] is calling to see if Medical Records reqeust was received for the most recent OV please advise CB- [phone]

## 2021-01-20 NOTE — Telephone Encounter (Signed)
ROI received and was picked up by [facility] on 01/19/21.

## 2021-02-07 IMAGING — CR DG ABDOMEN 1V
1 series · 2 of 2 positions shown · non-contrast
Comparison: Abdomen and pelvis CT dated 04/24/2019.

CLINICAL DATA: Lower abdominal pain and constipation for the past 2
months. The pain extends into the left groin.

EXAM:
ABDOMEN - 1 VIEW

[Series 1: dg abd 1 view · 0.14mm/px · 2 of 2 slices shown]
[im 1/2]
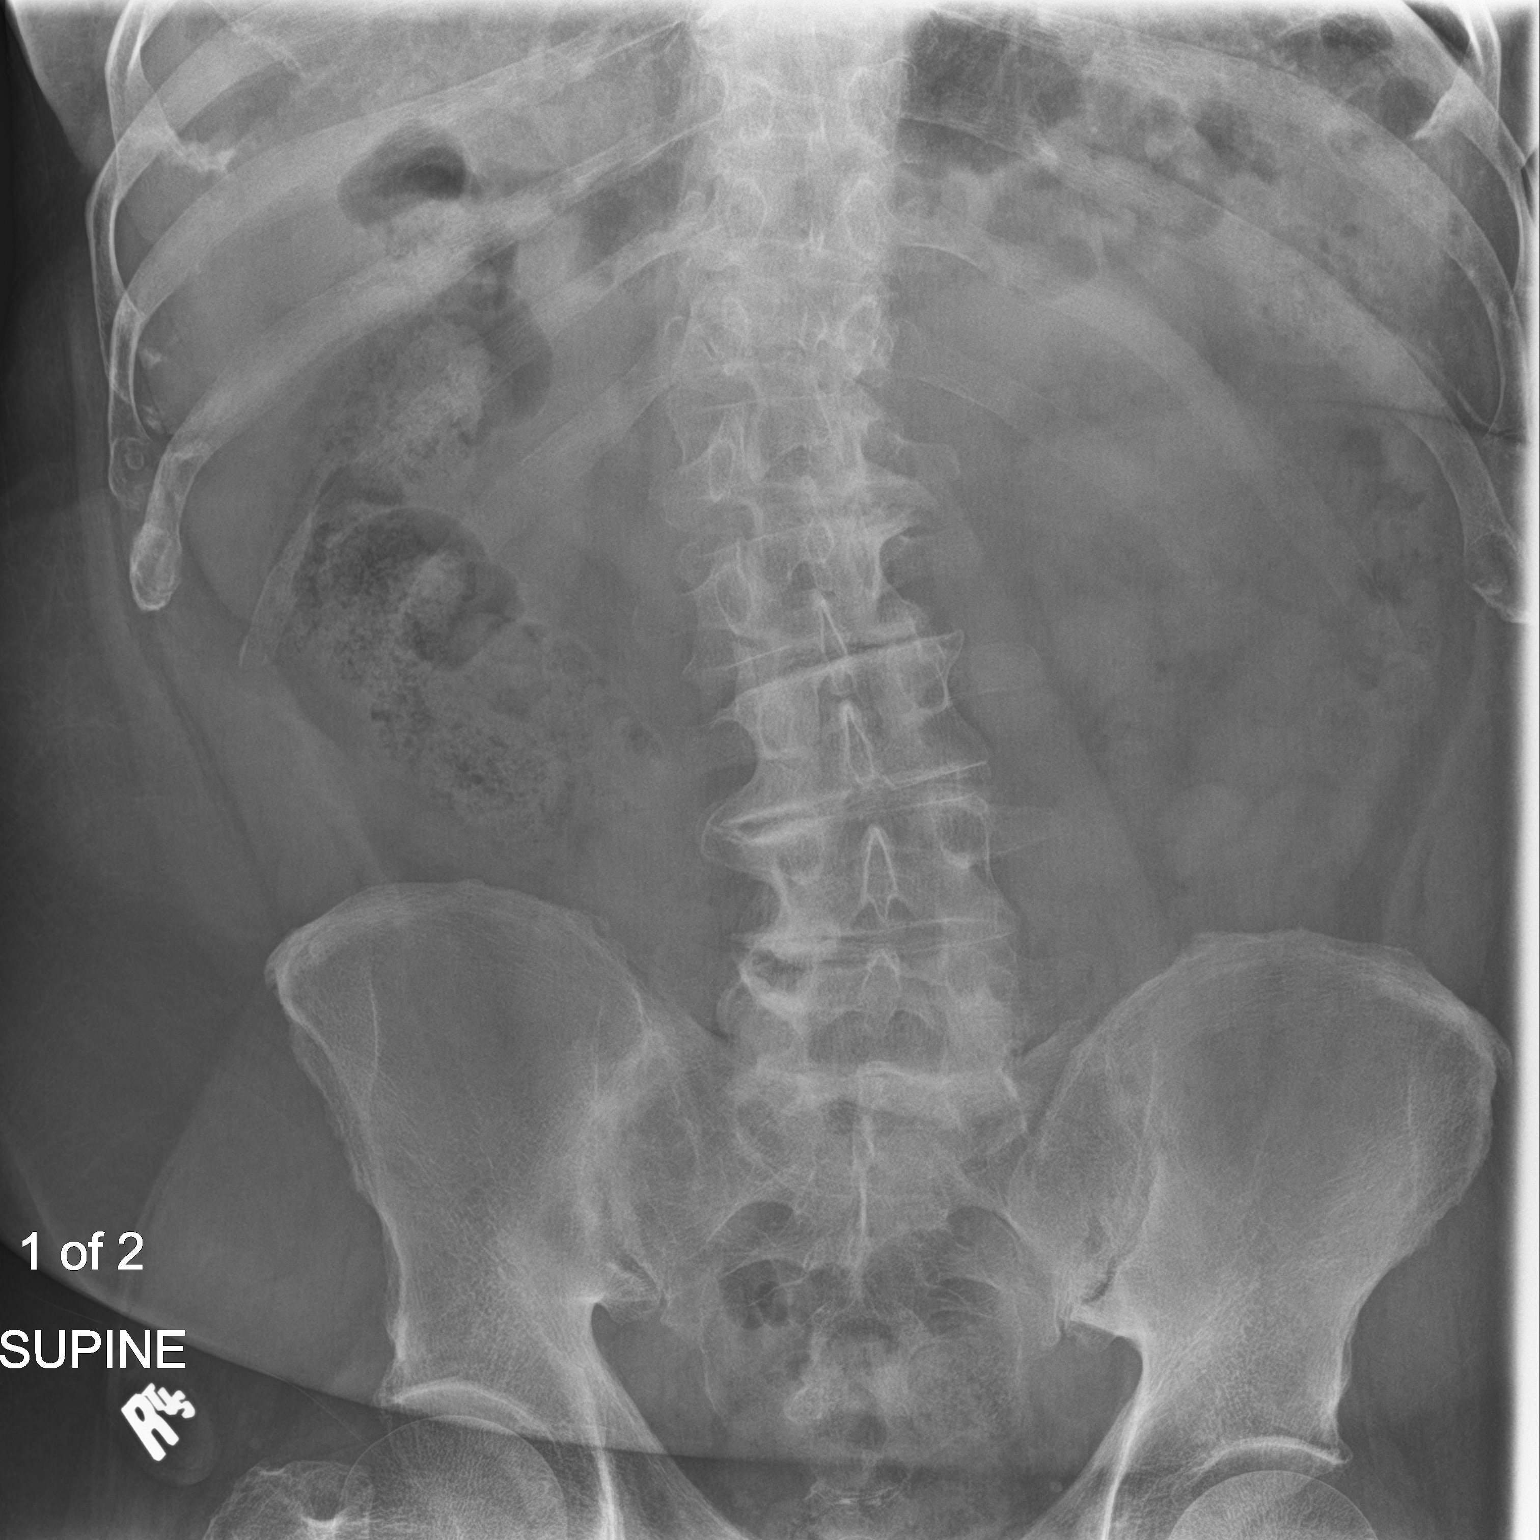
[im 2/2]
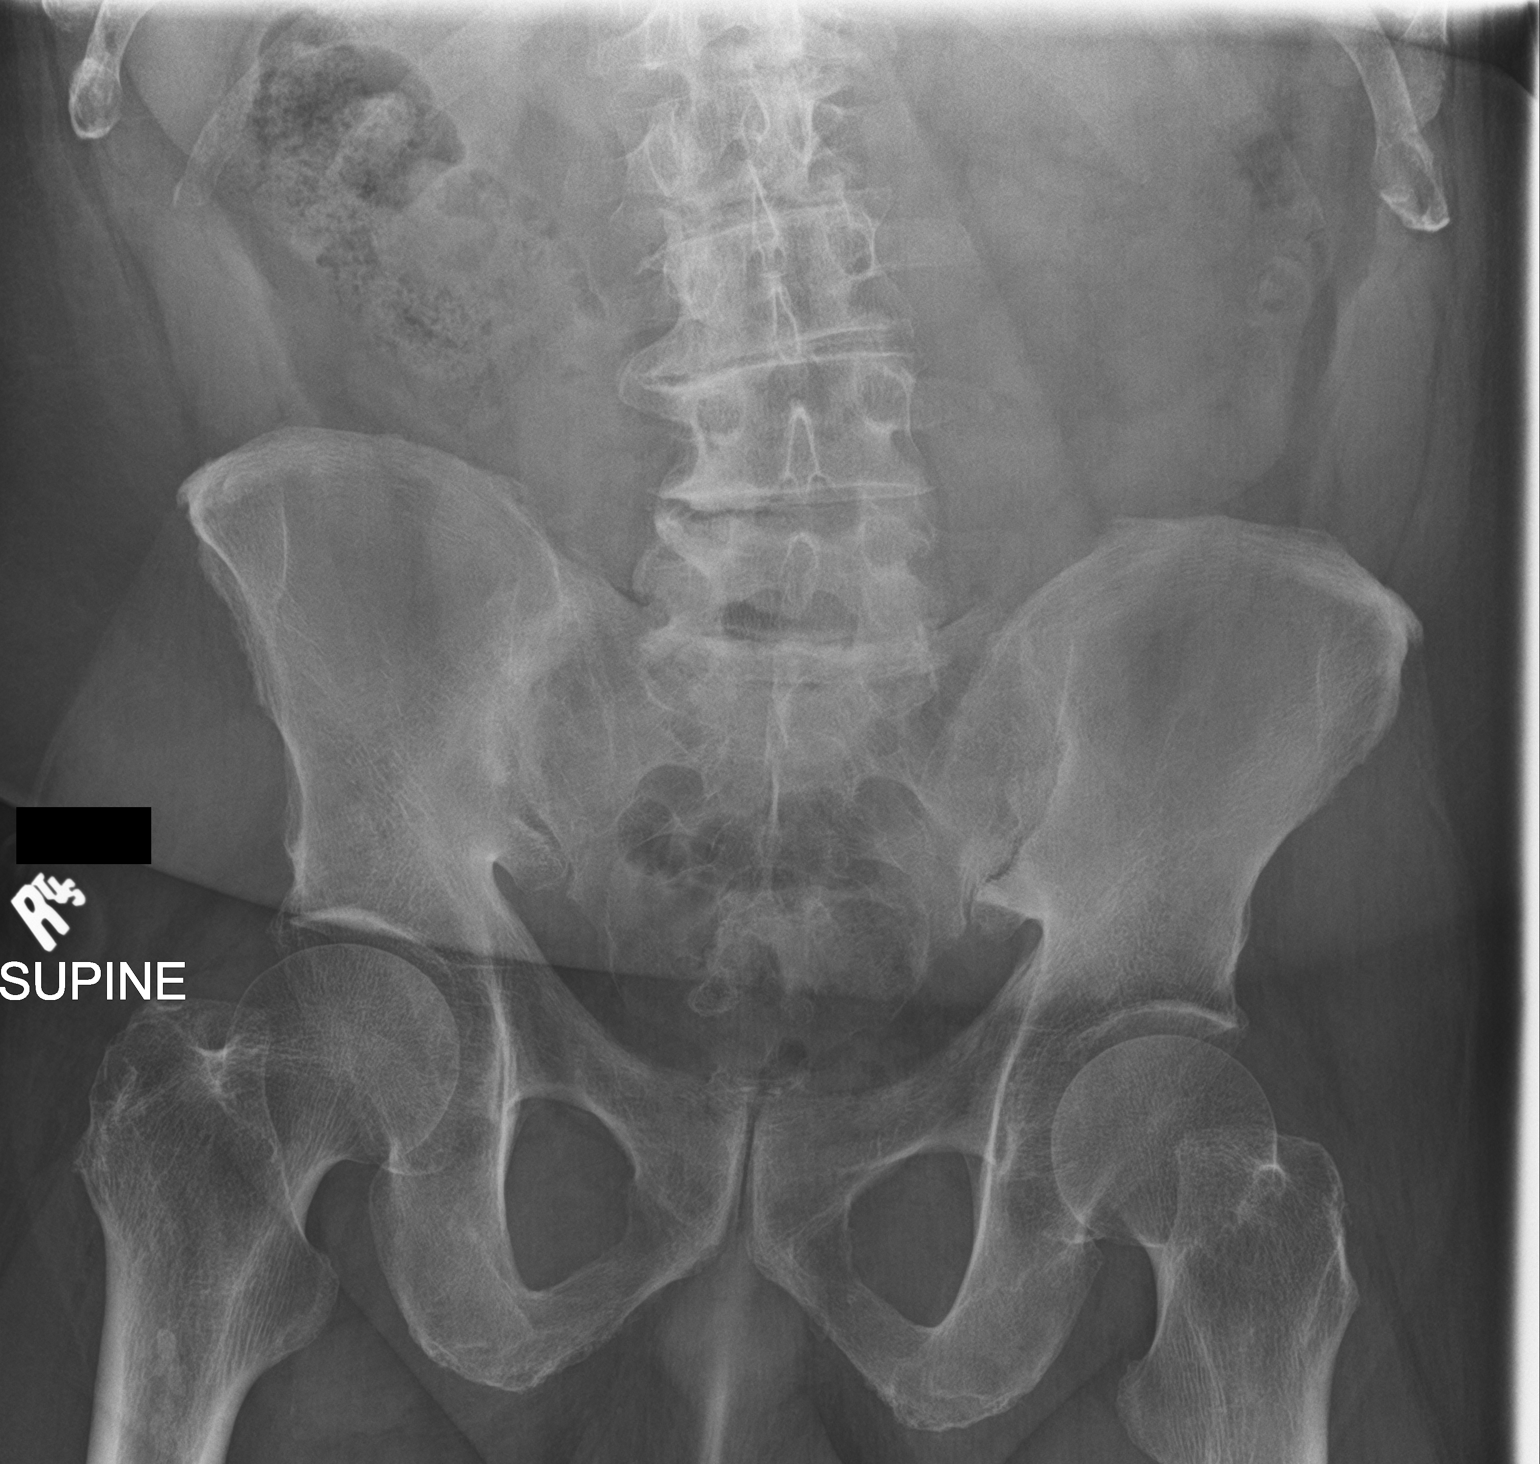

[2 of 2 positions shown; findings below may reference images not displayed]

FINDINGS: Normal bowel gas pattern. Normal amount of stool. Lumbar spine
degenerative changes and mild thoracolumbar scoliosis. Bilateral
sacroiliac joint degenerative changes.
IMPRESSION: No acute abnormality.

## 2021-02-07 IMAGING — CR DG LUMBAR SPINE COMPLETE 4+V
1 series · 7 of 7 positions shown · non-contrast
Comparison: None.

CLINICAL DATA: Low back and bilateral intermittent thigh pain.

EXAM:
LUMBAR SPINE - COMPLETE 4+ VIEW

[Series 1: dg lumbar spine complete 4 +v · 0.14mm/px · 7 of 7 slices shown]
[im 1/7]
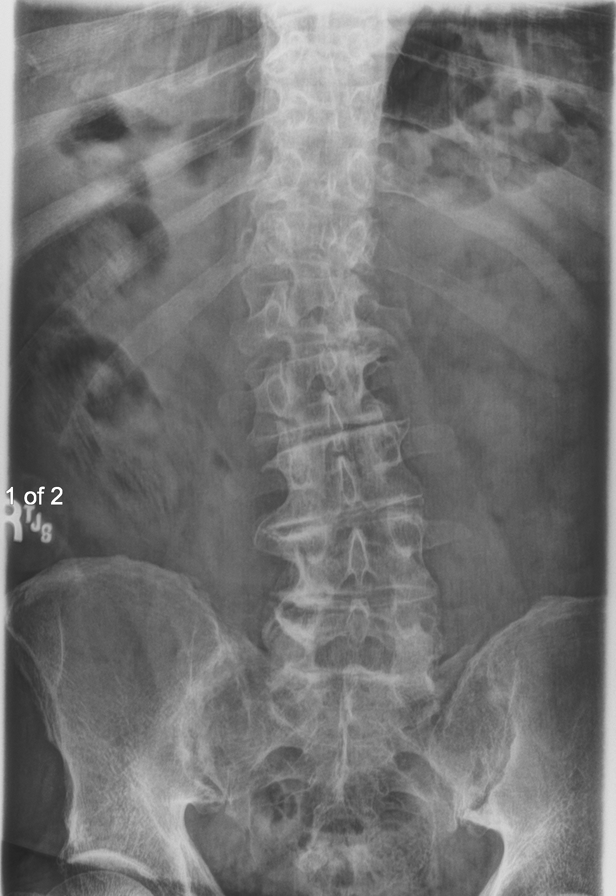
[im 2/7]
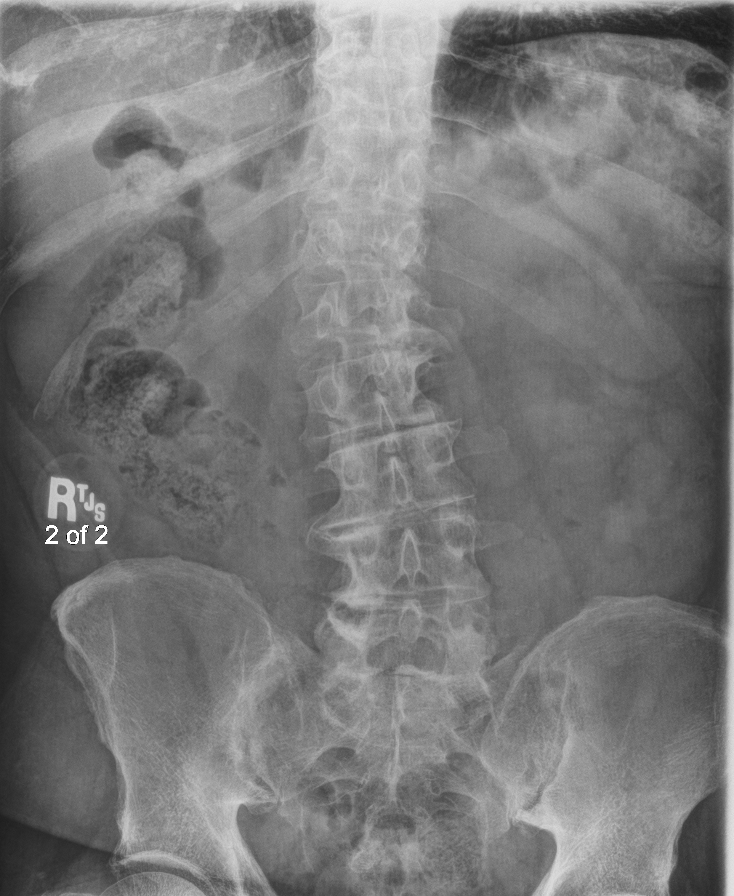
[im 3/7]
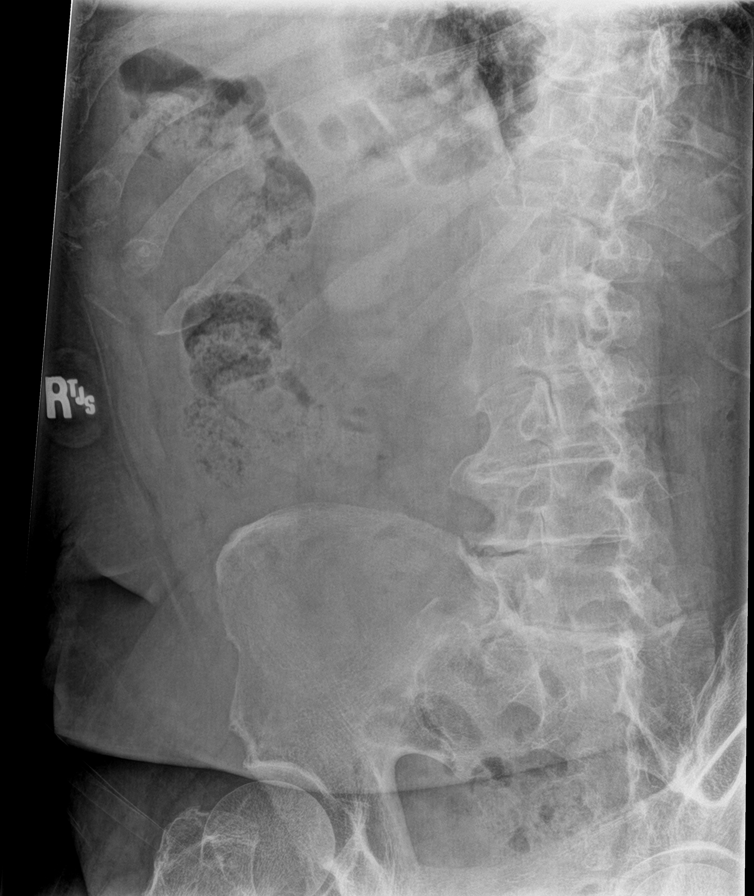
[im 4/7]
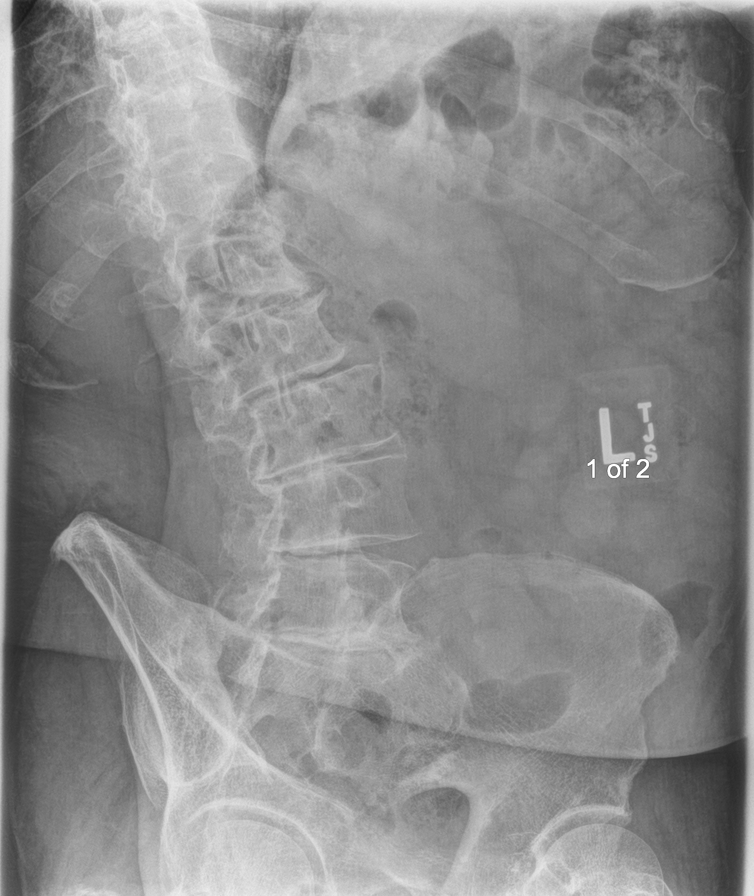
[im 5/7]
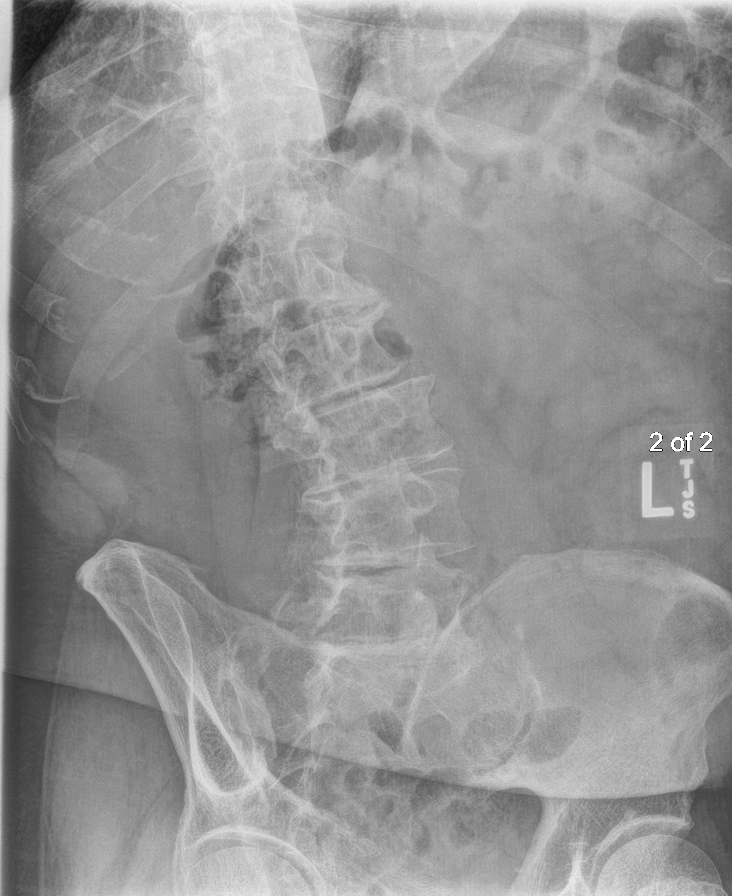
[im 6/7]
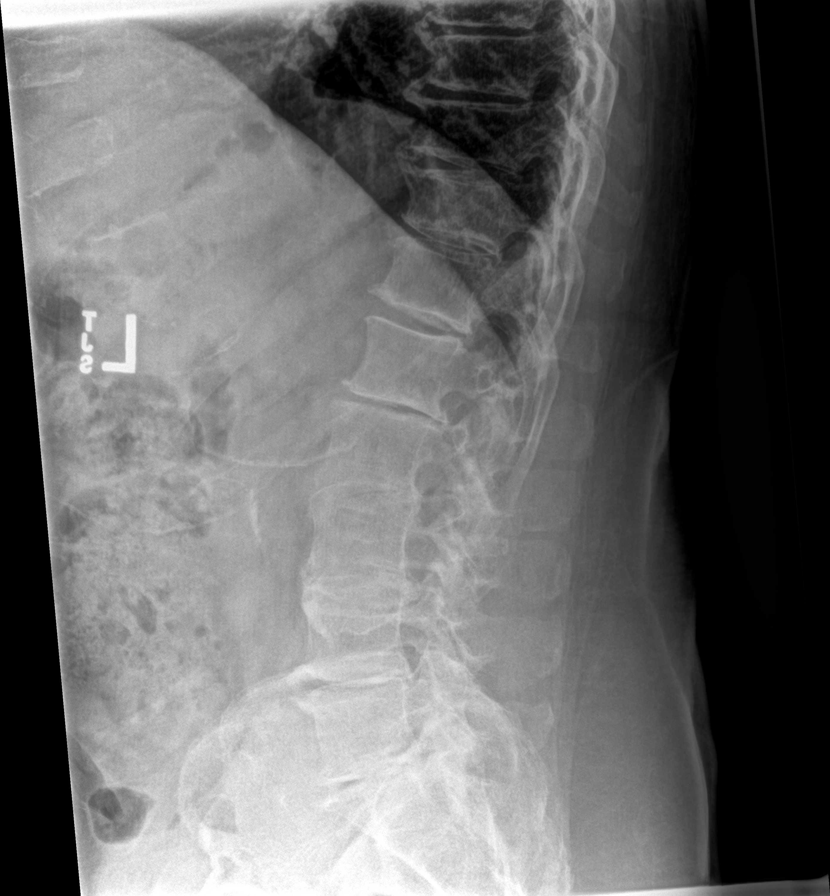
[im 7/7]
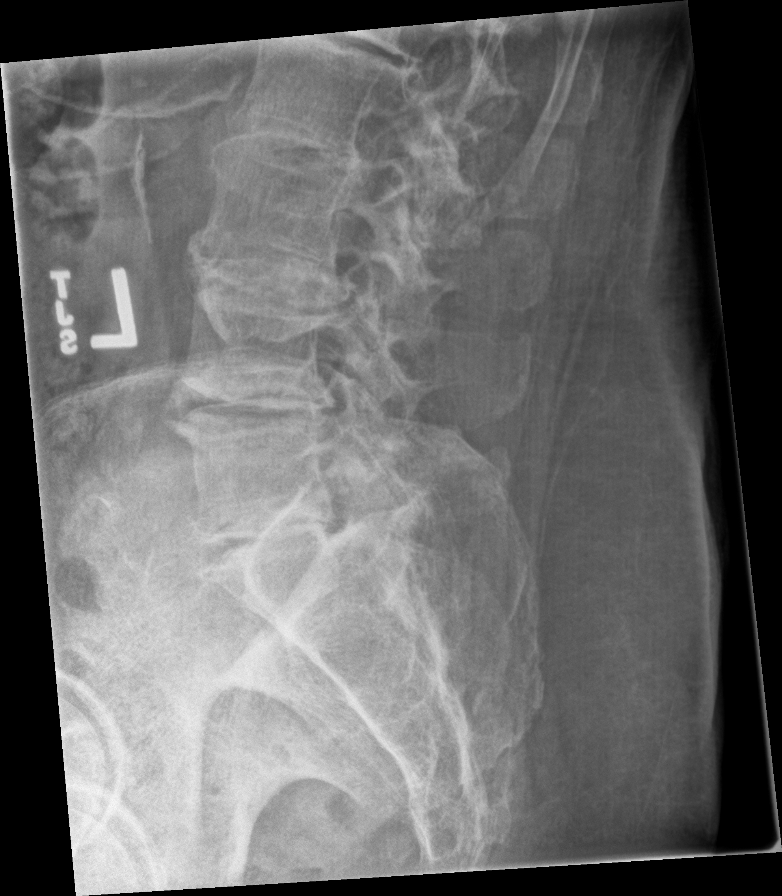

[7 of 7 positions shown; findings below may reference images not displayed]

FINDINGS: Five non-rib-bearing lumbar vertebrae. Moderate degenerative changes
throughout the lumbar and lower thoracic spine. These include facet
degenerative changes with associated minimal retrolisthesis at the
L1-2, L4-5 and L5-S1 levels and minimal anterolisthesis at the L2-3
level. No fractures or pars defects. Mild scoliosis.
IMPRESSION: Moderate lumbar and lower thoracic spine degenerative changes and
mild scoliosis.

## 2021-04-29 ENCOUNTER — Encounter: Payer: Self-pay | Admitting: General Surgery
